# Patient Record
Sex: Female | Born: 1965
Health system: Southern US, Community
[De-identification: ages and names within clinical notes are randomized; demographics above are authoritative.]

## PROBLEM LIST (undated history)

## (undated) DIAGNOSIS — K219 Gastro-esophageal reflux disease without esophagitis: Secondary | ICD-10-CM

## (undated) DIAGNOSIS — J45909 Unspecified asthma, uncomplicated: Secondary | ICD-10-CM

## (undated) DIAGNOSIS — D473 Essential (hemorrhagic) thrombocythemia: Secondary | ICD-10-CM

## (undated) DIAGNOSIS — E119 Type 2 diabetes mellitus without complications: Secondary | ICD-10-CM

## (undated) DIAGNOSIS — I10 Essential (primary) hypertension: Secondary | ICD-10-CM

## (undated) DIAGNOSIS — E785 Hyperlipidemia, unspecified: Secondary | ICD-10-CM

## (undated) HISTORY — PX: COMBINED HYSTERECTOMY ABDOMINAL W/ A&P REPAIR / OOPHORECTOMY: SUR292

## (undated) HISTORY — DX: Type 2 diabetes mellitus without complications: E11.9

## (undated) HISTORY — DX: Essential (hemorrhagic) thrombocythemia: D47.3

## (undated) HISTORY — DX: Unspecified asthma, uncomplicated: J45.909

## (undated) HISTORY — PX: WRIST ARTHROSCOPY: SUR100

## (undated) HISTORY — DX: Hyperlipidemia, unspecified: E78.5

---

## 1997-10-03 ENCOUNTER — Other Ambulatory Visit: Admission: RE | Admit: 1997-10-03 | Discharge: 1997-10-03 | Payer: Self-pay | Admitting: Gynecology

## 1997-10-31 ENCOUNTER — Ambulatory Visit (HOSPITAL_COMMUNITY): Admission: RE | Admit: 1997-10-31 | Discharge: 1997-10-31 | Payer: Self-pay | Admitting: Family Medicine

## 1997-11-21 ENCOUNTER — Ambulatory Visit (HOSPITAL_COMMUNITY): Admission: RE | Admit: 1997-11-21 | Discharge: 1997-11-21 | Payer: Self-pay | Admitting: Family Medicine

## 1998-01-05 ENCOUNTER — Other Ambulatory Visit: Admission: RE | Admit: 1998-01-05 | Discharge: 1998-01-05 | Payer: Self-pay | Admitting: Obstetrics and Gynecology

## 1998-02-17 ENCOUNTER — Encounter: Admission: RE | Admit: 1998-02-17 | Discharge: 1998-05-18 | Payer: Self-pay | Admitting: *Deleted

## 1998-04-17 ENCOUNTER — Ambulatory Visit (HOSPITAL_COMMUNITY): Admission: RE | Admit: 1998-04-17 | Discharge: 1998-04-17 | Payer: Self-pay | Admitting: Obstetrics and Gynecology

## 1998-08-17 ENCOUNTER — Observation Stay (HOSPITAL_COMMUNITY): Admission: RE | Admit: 1998-08-17 | Discharge: 1998-08-18 | Payer: Self-pay | Admitting: Obstetrics and Gynecology

## 1998-11-01 ENCOUNTER — Emergency Department (HOSPITAL_COMMUNITY): Admission: EM | Admit: 1998-11-01 | Discharge: 1998-11-01 | Payer: Self-pay

## 1998-11-23 ENCOUNTER — Encounter: Admission: RE | Admit: 1998-11-23 | Discharge: 1999-02-21 | Payer: Self-pay | Admitting: *Deleted

## 1999-05-10 DIAGNOSIS — E119 Type 2 diabetes mellitus without complications: Secondary | ICD-10-CM | POA: Insufficient documentation

## 1999-09-08 ENCOUNTER — Encounter: Admission: RE | Admit: 1999-09-08 | Discharge: 1999-09-08 | Payer: Self-pay | Admitting: Internal Medicine

## 1999-09-27 ENCOUNTER — Encounter: Admission: RE | Admit: 1999-09-27 | Discharge: 1999-12-26 | Payer: Self-pay | Admitting: *Deleted

## 1999-12-31 ENCOUNTER — Encounter: Admission: RE | Admit: 1999-12-31 | Discharge: 1999-12-31 | Payer: Self-pay | Admitting: Internal Medicine

## 2000-03-24 ENCOUNTER — Encounter: Admission: RE | Admit: 2000-03-24 | Discharge: 2000-03-24 | Payer: Self-pay | Admitting: Internal Medicine

## 2000-06-05 ENCOUNTER — Encounter: Admission: RE | Admit: 2000-06-05 | Discharge: 2000-06-05 | Payer: Self-pay | Admitting: Internal Medicine

## 2000-06-08 ENCOUNTER — Encounter: Payer: Self-pay | Admitting: *Deleted

## 2000-06-08 ENCOUNTER — Ambulatory Visit (HOSPITAL_COMMUNITY): Admission: RE | Admit: 2000-06-08 | Discharge: 2000-06-08 | Payer: Self-pay | Admitting: *Deleted

## 2000-07-02 ENCOUNTER — Emergency Department (HOSPITAL_COMMUNITY): Admission: EM | Admit: 2000-07-02 | Discharge: 2000-07-02 | Payer: Self-pay | Admitting: *Deleted

## 2000-07-07 ENCOUNTER — Encounter: Admission: RE | Admit: 2000-07-07 | Discharge: 2000-07-07 | Payer: Self-pay | Admitting: Internal Medicine

## 2000-07-25 ENCOUNTER — Encounter: Admission: RE | Admit: 2000-07-25 | Discharge: 2000-07-25 | Payer: Self-pay | Admitting: Obstetrics & Gynecology

## 2000-08-08 ENCOUNTER — Encounter: Admission: RE | Admit: 2000-08-08 | Discharge: 2000-08-08 | Payer: Self-pay | Admitting: Hematology and Oncology

## 2000-09-25 ENCOUNTER — Emergency Department (HOSPITAL_COMMUNITY): Admission: EM | Admit: 2000-09-25 | Discharge: 2000-09-25 | Payer: Self-pay | Admitting: Internal Medicine

## 2000-09-25 ENCOUNTER — Encounter: Payer: Self-pay | Admitting: Internal Medicine

## 2000-10-06 ENCOUNTER — Ambulatory Visit (HOSPITAL_COMMUNITY): Admission: RE | Admit: 2000-10-06 | Discharge: 2000-10-06 | Payer: Self-pay | Admitting: Internal Medicine

## 2000-10-06 ENCOUNTER — Encounter: Admission: RE | Admit: 2000-10-06 | Discharge: 2000-10-06 | Payer: Self-pay | Admitting: Internal Medicine

## 2000-10-06 ENCOUNTER — Encounter: Payer: Self-pay | Admitting: Internal Medicine

## 2000-10-11 ENCOUNTER — Encounter: Admission: RE | Admit: 2000-10-11 | Discharge: 2000-10-11 | Payer: Self-pay | Admitting: Internal Medicine

## 2000-10-26 ENCOUNTER — Ambulatory Visit (HOSPITAL_COMMUNITY): Admission: RE | Admit: 2000-10-26 | Discharge: 2000-10-26 | Payer: Self-pay | Admitting: Internal Medicine

## 2000-10-30 ENCOUNTER — Encounter: Admission: RE | Admit: 2000-10-30 | Discharge: 2000-10-30 | Payer: Self-pay | Admitting: Internal Medicine

## 2000-11-02 ENCOUNTER — Ambulatory Visit (HOSPITAL_COMMUNITY): Admission: RE | Admit: 2000-11-02 | Discharge: 2000-11-02 | Payer: Self-pay | Admitting: Internal Medicine

## 2000-11-13 ENCOUNTER — Encounter: Admission: RE | Admit: 2000-11-13 | Discharge: 2000-11-13 | Payer: Self-pay | Admitting: Internal Medicine

## 2000-11-15 ENCOUNTER — Ambulatory Visit (HOSPITAL_COMMUNITY): Admission: RE | Admit: 2000-11-15 | Discharge: 2000-11-15 | Payer: Self-pay | Admitting: Family Medicine

## 2000-11-15 ENCOUNTER — Encounter: Payer: Self-pay | Admitting: Family Medicine

## 2000-12-15 ENCOUNTER — Ambulatory Visit (HOSPITAL_COMMUNITY): Admission: RE | Admit: 2000-12-15 | Discharge: 2000-12-15 | Payer: Self-pay | Admitting: Internal Medicine

## 2000-12-15 ENCOUNTER — Encounter: Payer: Self-pay | Admitting: Internal Medicine

## 2001-02-08 ENCOUNTER — Ambulatory Visit (HOSPITAL_COMMUNITY): Admission: RE | Admit: 2001-02-08 | Discharge: 2001-02-08 | Payer: Self-pay | Admitting: Internal Medicine

## 2001-03-26 ENCOUNTER — Encounter: Payer: Self-pay | Admitting: Emergency Medicine

## 2001-03-26 ENCOUNTER — Emergency Department (HOSPITAL_COMMUNITY): Admission: EM | Admit: 2001-03-26 | Discharge: 2001-03-26 | Payer: Self-pay | Admitting: Emergency Medicine

## 2001-03-29 ENCOUNTER — Encounter: Admission: RE | Admit: 2001-03-29 | Discharge: 2001-03-29 | Payer: Self-pay | Admitting: Internal Medicine

## 2001-04-03 ENCOUNTER — Ambulatory Visit (HOSPITAL_COMMUNITY): Admission: RE | Admit: 2001-04-03 | Discharge: 2001-04-03 | Payer: Self-pay | Admitting: Internal Medicine

## 2001-05-21 ENCOUNTER — Encounter: Admission: RE | Admit: 2001-05-21 | Discharge: 2001-05-21 | Payer: Self-pay | Admitting: Internal Medicine

## 2001-06-05 ENCOUNTER — Ambulatory Visit (HOSPITAL_COMMUNITY): Admission: RE | Admit: 2001-06-05 | Discharge: 2001-06-05 | Payer: Self-pay

## 2001-06-05 ENCOUNTER — Encounter: Payer: Self-pay | Admitting: Internal Medicine

## 2001-06-22 ENCOUNTER — Encounter: Admission: RE | Admit: 2001-06-22 | Discharge: 2001-06-22 | Payer: Self-pay | Admitting: Internal Medicine

## 2001-09-28 ENCOUNTER — Encounter: Admission: RE | Admit: 2001-09-28 | Discharge: 2001-09-28 | Payer: Self-pay | Admitting: Internal Medicine

## 2002-03-29 ENCOUNTER — Encounter: Admission: RE | Admit: 2002-03-29 | Discharge: 2002-03-29 | Payer: Self-pay | Admitting: Internal Medicine

## 2002-04-24 ENCOUNTER — Encounter: Admission: RE | Admit: 2002-04-24 | Discharge: 2002-04-24 | Payer: Self-pay | Admitting: Internal Medicine

## 2002-05-23 ENCOUNTER — Encounter: Admission: RE | Admit: 2002-05-23 | Discharge: 2002-05-23 | Payer: Self-pay | Admitting: Internal Medicine

## 2002-11-25 ENCOUNTER — Encounter: Admission: RE | Admit: 2002-11-25 | Discharge: 2002-11-25 | Payer: Self-pay | Admitting: Internal Medicine

## 2002-12-13 ENCOUNTER — Encounter: Admission: RE | Admit: 2002-12-13 | Discharge: 2002-12-13 | Payer: Self-pay | Admitting: Internal Medicine

## 2002-12-16 ENCOUNTER — Encounter: Payer: Self-pay | Admitting: Internal Medicine

## 2002-12-16 ENCOUNTER — Ambulatory Visit (HOSPITAL_COMMUNITY): Admission: RE | Admit: 2002-12-16 | Discharge: 2002-12-16 | Payer: Self-pay | Admitting: Internal Medicine

## 2003-01-23 ENCOUNTER — Encounter: Admission: RE | Admit: 2003-01-23 | Discharge: 2003-01-23 | Payer: Self-pay | Admitting: Obstetrics and Gynecology

## 2003-02-04 ENCOUNTER — Encounter: Admission: RE | Admit: 2003-02-04 | Discharge: 2003-02-04 | Payer: Self-pay | Admitting: Internal Medicine

## 2003-06-02 ENCOUNTER — Encounter: Admission: RE | Admit: 2003-06-02 | Discharge: 2003-06-04 | Payer: Self-pay | Admitting: Internal Medicine

## 2003-07-11 ENCOUNTER — Encounter: Admission: RE | Admit: 2003-07-11 | Discharge: 2003-07-11 | Payer: Self-pay | Admitting: Internal Medicine

## 2003-08-08 ENCOUNTER — Emergency Department (HOSPITAL_COMMUNITY): Admission: EM | Admit: 2003-08-08 | Discharge: 2003-08-08 | Payer: Self-pay | Admitting: Emergency Medicine

## 2003-08-11 ENCOUNTER — Emergency Department (HOSPITAL_COMMUNITY): Admission: EM | Admit: 2003-08-11 | Discharge: 2003-08-11 | Payer: Self-pay | Admitting: Emergency Medicine

## 2003-09-30 ENCOUNTER — Encounter: Admission: RE | Admit: 2003-09-30 | Discharge: 2003-09-30 | Payer: Self-pay | Admitting: Internal Medicine

## 2003-09-30 ENCOUNTER — Ambulatory Visit (HOSPITAL_COMMUNITY): Admission: RE | Admit: 2003-09-30 | Discharge: 2003-09-30 | Payer: Self-pay | Admitting: Internal Medicine

## 2003-09-30 ENCOUNTER — Inpatient Hospital Stay (HOSPITAL_COMMUNITY): Admission: AD | Admit: 2003-09-30 | Discharge: 2003-10-06 | Payer: Self-pay | Admitting: Internal Medicine

## 2003-11-11 ENCOUNTER — Encounter: Admission: RE | Admit: 2003-11-11 | Discharge: 2003-11-11 | Payer: Self-pay | Admitting: Internal Medicine

## 2003-11-17 ENCOUNTER — Inpatient Hospital Stay (HOSPITAL_COMMUNITY): Admission: AD | Admit: 2003-11-17 | Discharge: 2003-11-21 | Payer: Self-pay | Admitting: Internal Medicine

## 2003-11-17 ENCOUNTER — Encounter: Admission: RE | Admit: 2003-11-17 | Discharge: 2003-11-17 | Payer: Self-pay | Admitting: Internal Medicine

## 2003-11-22 ENCOUNTER — Emergency Department (HOSPITAL_COMMUNITY): Admission: EM | Admit: 2003-11-22 | Discharge: 2003-11-22 | Payer: Self-pay | Admitting: *Deleted

## 2003-11-23 ENCOUNTER — Emergency Department (HOSPITAL_COMMUNITY): Admission: EM | Admit: 2003-11-23 | Discharge: 2003-11-23 | Payer: Self-pay | Admitting: Emergency Medicine

## 2003-11-24 ENCOUNTER — Encounter (HOSPITAL_COMMUNITY): Admission: RE | Admit: 2003-11-24 | Discharge: 2003-12-15 | Payer: Self-pay | Admitting: Internal Medicine

## 2003-11-29 ENCOUNTER — Emergency Department (HOSPITAL_COMMUNITY): Admission: EM | Admit: 2003-11-29 | Discharge: 2003-11-29 | Payer: Self-pay | Admitting: *Deleted

## 2003-11-30 ENCOUNTER — Emergency Department (HOSPITAL_COMMUNITY): Admission: EM | Admit: 2003-11-30 | Discharge: 2003-11-30 | Payer: Self-pay | Admitting: Emergency Medicine

## 2003-12-01 ENCOUNTER — Encounter: Admission: RE | Admit: 2003-12-01 | Discharge: 2003-12-01 | Payer: Self-pay | Admitting: Internal Medicine

## 2004-01-02 ENCOUNTER — Encounter: Admission: RE | Admit: 2004-01-02 | Discharge: 2004-01-02 | Payer: Self-pay | Admitting: Internal Medicine

## 2004-04-06 ENCOUNTER — Ambulatory Visit: Payer: Self-pay | Admitting: Internal Medicine

## 2004-05-05 ENCOUNTER — Ambulatory Visit: Payer: Self-pay | Admitting: Internal Medicine

## 2004-06-13 ENCOUNTER — Ambulatory Visit: Payer: Self-pay | Admitting: *Deleted

## 2004-06-13 ENCOUNTER — Emergency Department (HOSPITAL_COMMUNITY): Admission: EM | Admit: 2004-06-13 | Discharge: 2004-06-13 | Payer: Self-pay | Admitting: Emergency Medicine

## 2004-08-09 ENCOUNTER — Ambulatory Visit: Payer: Self-pay | Admitting: Internal Medicine

## 2004-08-25 ENCOUNTER — Ambulatory Visit: Payer: Self-pay | Admitting: Internal Medicine

## 2005-05-24 ENCOUNTER — Encounter: Admission: RE | Admit: 2005-05-24 | Discharge: 2005-05-24 | Payer: Self-pay | Admitting: Family Medicine

## 2006-07-11 ENCOUNTER — Encounter: Admission: RE | Admit: 2006-07-11 | Discharge: 2006-07-11 | Payer: Self-pay | Admitting: Family Medicine

## 2007-03-28 ENCOUNTER — Ambulatory Visit: Payer: Self-pay | Admitting: Internal Medicine

## 2007-08-22 ENCOUNTER — Encounter: Admission: RE | Admit: 2007-08-22 | Discharge: 2007-08-22 | Payer: Self-pay | Admitting: Family Medicine

## 2007-09-15 ENCOUNTER — Emergency Department (HOSPITAL_COMMUNITY): Admission: EM | Admit: 2007-09-15 | Discharge: 2007-09-15 | Payer: Self-pay | Admitting: Emergency Medicine

## 2008-01-11 ENCOUNTER — Ambulatory Visit: Payer: Self-pay | Admitting: Internal Medicine

## 2008-01-11 DIAGNOSIS — J45901 Unspecified asthma with (acute) exacerbation: Secondary | ICD-10-CM | POA: Insufficient documentation

## 2008-01-18 ENCOUNTER — Telehealth (INDEPENDENT_AMBULATORY_CARE_PROVIDER_SITE_OTHER): Payer: Self-pay | Admitting: *Deleted

## 2008-03-05 ENCOUNTER — Ambulatory Visit: Payer: Self-pay | Admitting: Internal Medicine

## 2008-03-05 DIAGNOSIS — R29818 Other symptoms and signs involving the nervous system: Secondary | ICD-10-CM | POA: Insufficient documentation

## 2008-03-06 ENCOUNTER — Telehealth: Payer: Self-pay | Admitting: Internal Medicine

## 2008-03-14 ENCOUNTER — Encounter: Payer: Self-pay | Admitting: Internal Medicine

## 2008-03-17 ENCOUNTER — Telehealth (INDEPENDENT_AMBULATORY_CARE_PROVIDER_SITE_OTHER): Payer: Self-pay | Admitting: *Deleted

## 2008-04-27 ENCOUNTER — Emergency Department (HOSPITAL_COMMUNITY): Admission: EM | Admit: 2008-04-27 | Discharge: 2008-04-27 | Payer: Self-pay | Admitting: Family Medicine

## 2008-07-09 ENCOUNTER — Telehealth (INDEPENDENT_AMBULATORY_CARE_PROVIDER_SITE_OTHER): Payer: Self-pay | Admitting: *Deleted

## 2008-07-23 ENCOUNTER — Telehealth: Payer: Self-pay | Admitting: Internal Medicine

## 2008-09-02 ENCOUNTER — Encounter: Admission: RE | Admit: 2008-09-02 | Discharge: 2008-09-02 | Payer: Self-pay | Admitting: Family Medicine

## 2008-09-02 ENCOUNTER — Telehealth (INDEPENDENT_AMBULATORY_CARE_PROVIDER_SITE_OTHER): Payer: Self-pay | Admitting: *Deleted

## 2008-09-10 ENCOUNTER — Ambulatory Visit: Payer: Self-pay | Admitting: Internal Medicine

## 2008-10-21 ENCOUNTER — Emergency Department (HOSPITAL_COMMUNITY): Admission: EM | Admit: 2008-10-21 | Discharge: 2008-10-21 | Payer: Self-pay | Admitting: Emergency Medicine

## 2008-11-12 ENCOUNTER — Encounter: Admission: RE | Admit: 2008-11-12 | Discharge: 2009-02-05 | Payer: Self-pay | Admitting: Orthopaedic Surgery

## 2008-12-09 ENCOUNTER — Telehealth (INDEPENDENT_AMBULATORY_CARE_PROVIDER_SITE_OTHER): Payer: Self-pay | Admitting: *Deleted

## 2008-12-24 ENCOUNTER — Ambulatory Visit (HOSPITAL_COMMUNITY): Admission: RE | Admit: 2008-12-24 | Discharge: 2008-12-24 | Payer: Self-pay | Admitting: Orthopaedic Surgery

## 2009-01-08 ENCOUNTER — Encounter: Admission: RE | Admit: 2009-01-08 | Discharge: 2009-01-08 | Payer: Self-pay | Admitting: Family Medicine

## 2009-02-10 ENCOUNTER — Telehealth (INDEPENDENT_AMBULATORY_CARE_PROVIDER_SITE_OTHER): Payer: Self-pay | Admitting: *Deleted

## 2009-02-17 ENCOUNTER — Ambulatory Visit: Payer: Self-pay | Admitting: Internal Medicine

## 2009-02-17 DIAGNOSIS — J4520 Mild intermittent asthma, uncomplicated: Secondary | ICD-10-CM

## 2009-02-17 DIAGNOSIS — K219 Gastro-esophageal reflux disease without esophagitis: Secondary | ICD-10-CM | POA: Insufficient documentation

## 2009-02-17 DIAGNOSIS — J4540 Moderate persistent asthma, uncomplicated: Secondary | ICD-10-CM | POA: Insufficient documentation

## 2009-03-31 ENCOUNTER — Telehealth (INDEPENDENT_AMBULATORY_CARE_PROVIDER_SITE_OTHER): Payer: Self-pay | Admitting: *Deleted

## 2009-05-25 ENCOUNTER — Telehealth: Payer: Self-pay | Admitting: Internal Medicine

## 2009-05-29 ENCOUNTER — Telehealth: Payer: Self-pay | Admitting: Internal Medicine

## 2009-07-09 ENCOUNTER — Telehealth (INDEPENDENT_AMBULATORY_CARE_PROVIDER_SITE_OTHER): Payer: Self-pay | Admitting: *Deleted

## 2009-07-23 ENCOUNTER — Telehealth (INDEPENDENT_AMBULATORY_CARE_PROVIDER_SITE_OTHER): Payer: Self-pay | Admitting: *Deleted

## 2009-08-13 ENCOUNTER — Telehealth (INDEPENDENT_AMBULATORY_CARE_PROVIDER_SITE_OTHER): Payer: Self-pay | Admitting: *Deleted

## 2009-09-21 ENCOUNTER — Telehealth: Payer: Self-pay | Admitting: Internal Medicine

## 2009-11-23 ENCOUNTER — Telehealth: Payer: Self-pay | Admitting: Internal Medicine

## 2009-12-01 ENCOUNTER — Ambulatory Visit: Payer: Self-pay | Admitting: Internal Medicine

## 2010-01-12 ENCOUNTER — Telehealth (INDEPENDENT_AMBULATORY_CARE_PROVIDER_SITE_OTHER): Payer: Self-pay | Admitting: *Deleted

## 2010-02-02 ENCOUNTER — Ambulatory Visit: Payer: Self-pay | Admitting: Internal Medicine

## 2010-06-08 NOTE — Progress Notes (Signed)
Summary: cough   Phone Note Call from Patient Call back at [phone]   Caller: Patient Call For: Ebin Palazzi Summary of Call: pt would like cough syrup called to pharmacy [facility] Initial call taken by: Rickard Patience,  May 29, 2009 8:43 AM  Follow-up for Phone Call        pt c/o congestion and productive cough with yellow phlegm x 3 days. pt also c/o being hoarse x 3 days as well.  Pt request cough medicine. Please advise. Carron Curie CMA  May 29, 2009 9:00 AM  allergies: PCN, Voltaren   Additional Follow-up for Phone Call Additional follow up Details #1::        I added codeine cough syrup to med list. Ok to send. Additional Follow-up by: Waymon Budge MD,  May 29, 2009 9:08 AM    Additional Follow-up for Phone Call Additional follow up Details #2::    rx called in. pt aware. Carron Curie CMA  May 29, 2009 9:25 AM   New/Updated Medications: PROMETHAZINE-CODEINE 6.25-10 MG/5ML SYRP (PROMETHAZINE-CODEINE) 1 teaspoon four times a day as needed cough Prescriptions: PROMETHAZINE-CODEINE 6.25-10 MG/5ML SYRP (PROMETHAZINE-CODEINE) 1 teaspoon four times a day as needed cough  #200 ml x 0   Entered and Authorized by:   Waymon Budge MD   Signed by:   Waymon Budge MD on 05/29/2009   Method used:   Historical   RxID:   [id number]

## 2010-06-08 NOTE — Progress Notes (Signed)
Summary: samples  Phone Note Call from Patient Call back at [phone]   Caller: Patient Call For: young Summary of Call: pt need samples of advair 250/50 would like month supply Initial call taken by: Rickard Patience,  August 13, 2009 8:40 AM  Follow-up for Phone Call        LM that sample was left at front desk for pick up. Abigail Miyamoto RN  August 13, 2009 9:05 AM

## 2010-06-08 NOTE — Progress Notes (Signed)
Summary: samples  Phone Note Call from Patient Call back at [phone]   Caller: Patient-Brooke Floyd Call For: young Reason for Call: Talk to Nurse Summary of Call: Samples of Advair 250/50 Initial call taken by: Brooke Floyd,  May 25, 2009 9:26 AM  Follow-up for Phone Call        sample left at front. pt aware.  Brooke Floyd CMA  May 25, 2009 9:41 AM

## 2010-06-08 NOTE — Assessment & Plan Note (Signed)
Summary: f/u ///kp   Allergies: 1)  ! Pcn 2)  ! Voltaren   Other Orders: No Charge Patient Arrived (NCPA0) (NCPA0)   Pt rescheduled to 12-01-09. Brooke Floyd

## 2010-06-08 NOTE — Progress Notes (Signed)
Summary: samples  Phone Note Call from Patient Call back at Home Phone [phone]   Caller: Patient Call For: young Reason for Call: Talk to Nurse Summary of Call: Patient asking if we have samples of lantus? Initial call taken by: Lehman Prom,  January 12, 2010 12:17 PM  Follow-up for Phone Call        We do not have any samples to give patient; she should call her PCP or diabetic dr-she states she is trying to get assistance through Lantus program. Also, pt stated that she needs help with her Advair-mailed assistance program info to patient.Reynaldo Minium CMA  January 12, 2010 12:30 PM    Will return to our office once completed.Reynaldo Minium CMA  January 12, 2010 12:30 PM   pt aware no samples available Philipp Deputy [facility]  January 12, 2010 12:27 PM  Follow-up by: Philipp Deputy CMA,  January 12, 2010 12:27 PM

## 2010-06-08 NOTE — Progress Notes (Signed)
Summary: samples  Phone Note Call from Patient Call back at [phone]   Caller: Patient Call For: young Reason for Call: Talk to Nurse Summary of Call: Advair 250/50 samples Initial call taken by: Eugene Gavia,  July 09, 2009 10:26 AM  Follow-up for Phone Call        1 box of advair left up front for pt to pick up.  Pt notified. Follow-up by: Vernie Murders,  July 09, 2009 11:19 AM

## 2010-06-08 NOTE — Progress Notes (Signed)
Summary: nos appt  Phone Note Call from Patient   Caller: [email or web address]  Call For: Neisha Hinger Summary of Call: ATC pt to rsc nos from 5/13, home phone disconnected, incorrect cell number. Initial call taken by: Darletta Moll,  Sep 21, 2009 3:14 PM

## 2010-06-08 NOTE — Progress Notes (Signed)
Summary: returned call  Phone Note Call from Patient Call back at Home Phone [phone]   Caller: Patient Call For: young Summary of Call: pt returning call (says someone called her today).  Initial call taken by: Tivis Ringer, CNA,  November 23, 2009 12:23 PM  Follow-up for Phone Call        unsure who called her.  I don't see anywhere in her chart where we tried to call her.  Will forward message to Florentina Addison to see if CY or Florentina Addison tried calling pt.  Aundra Millet Reynolds LPN  November 23, 2009 1:22 PM   Additional Follow-up for Phone Call Additional follow up Details #1::        Unsure why someone would have called her; she does have an appt coming up on 11-30-09 at 345pm with [facility].Reynaldo Minium CMA  November 23, 2009 1:51 PM     Additional Follow-up for Phone Call Additional follow up Details #2::    ATC pt at home #.  Line has been disconnected.  ATC pt at work #.  Was informed I had the wrong #.  Looked through pt's chart and found a previous phone note with a different #.Marland KitchenMarland KitchenMarland [phone].  Called this # and was informed that # is no longer accepting phone calls.  Therefore, I have no way to reach pt.  Will forward message back to Nicholos Johns so when she calls back we can get a correct # to reach pt at.  Aundra Millet Reynolds LPN  November 23, 2009 2:16 PM
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
## 2010-06-08 NOTE — Progress Notes (Signed)
Summary: Records request from [facility]  Request for records received from [facility]. Request forwarded to [facility]. Dena Chavis  July 23, 2009 3:30 PM
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
## 2010-07-01 ENCOUNTER — Telehealth: Payer: Self-pay | Admitting: Internal Medicine

## 2010-07-06 NOTE — Progress Notes (Signed)
Summary: prescription-zpak and hydromet sent to pharmacy  Phone Note Call from Patient Call back at Home Phone [phone]   Caller: Patient Call For: Dominik Yordy Summary of Call: Wants rx for hydrocodone.//[facility] Initial call taken by: Darletta Moll,  July 01, 2010 12:29 PM  Follow-up for Phone Call        called and spoke with pt.  pt states she had an "asthma attack" on Tuesday 06/29/2010.  Pt states she is feeling better and is breathing better- has only "slight wheezing and tightness in chest" but states she still has a "bad cough."  Will cough up yellow sputum.  Also c/o sore throat, hurts to talk ,and hoarseness.  Pt denies fever or nasal congestion.  Pt states she is having to use rescue hfa once a day.  pt requesting rx for cough syrup.  Please adivse. Marland KitchenArman Filter LPN  July 01, 2010 2:34 PM  allergies: pcn, voltaren  Additional Follow-up for Phone Call Additional follow up Details #1::        Per [facility]-hydromet #265ml 1 tsp every 6 hours as needed cough and Zpak #1 take as directed no refills on either.Reynaldo Minium CMA  July 01, 2010 4:17 PM   Pt informed of above. Rx's sent and called to [facility]. Zackery Barefoot CMA  July 01, 2010 4:45 PM     New/Updated Medications: ZITHROMAX Z-PAK 250 MG TABS (AZITHROMYCIN) as directed HYDROMET 5-1.5 MG/5ML SYRP (HYDROCODONE-HOMATROPINE) 1 tsp by mouth every 6 hours as needed for cough Prescriptions: HYDROMET 5-1.5 MG/5ML SYRP (HYDROCODONE-HOMATROPINE) 1 tsp by mouth every 6 hours as needed for cough  #279mL x 0   Entered by:   Zackery Barefoot CMA   Authorized by:   Waymon Budge MD   Signed by:   Zackery Barefoot CMA on 07/01/2010   Method used:   Telephoned to ...       [facility] [id number] (retail)       [address]       [place], [place]  [address]       Ph: [phone]       Fax: [phone]   RxID:   [id number] ZITHROMAX Z-PAK 250 MG TABS (AZITHROMYCIN) as directed  #1 x  0   Entered by:   Zackery Barefoot CMA   Authorized by:   Waymon Budge MD   Signed by:   Zackery Barefoot CMA on 07/01/2010   Method used:   Electronically to        [facility] [id number] (retail)       [address]       [place], [place]  [address]       Ph: [phone]       Fax: [phone]   RxID:   [id number]

## 2010-07-07 ENCOUNTER — Telehealth (INDEPENDENT_AMBULATORY_CARE_PROVIDER_SITE_OTHER): Payer: Self-pay | Admitting: *Deleted

## 2010-07-15 NOTE — Progress Notes (Signed)
Summary: would like samples of dulera  Phone Note Call from Patient Call back at Home Phone [phone]   Caller: Patient Call For: young Summary of Call: patient phoned and wanted to know if we have any samples of Dulera. She can be reached at [phone] Initial call taken by: Vedia Coffer,  July 07, 2010 4:47 PM  Follow-up for Phone Call        1 sample dulera 100/75mcg left at front for pick up.  Pt aware.  Lot # [id number] Exp Date June 2012   Follow-up by: Gweneth Dimitri RN,  July 07, 2010 4:59 PM

## 2010-07-27 ENCOUNTER — Encounter: Payer: Self-pay | Admitting: Internal Medicine

## 2010-08-03 ENCOUNTER — Ambulatory Visit: Payer: Self-pay | Admitting: Internal Medicine
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
## 2010-09-20 ENCOUNTER — Encounter: Payer: Self-pay | Admitting: Internal Medicine

## 2010-09-20 ENCOUNTER — Ambulatory Visit (INDEPENDENT_AMBULATORY_CARE_PROVIDER_SITE_OTHER): Payer: Self-pay | Admitting: Internal Medicine

## 2010-09-20 VITALS — BP 130/78 | HR 110 | Ht 62.0 in | Wt 143.2 lb

## 2010-09-20 DIAGNOSIS — J45901 Unspecified asthma with (acute) exacerbation: Secondary | ICD-10-CM

## 2010-09-20 MED ORDER — ALBUTEROL SULFATE HFA 108 (90 BASE) MCG/ACT IN AERS: 2.0000 | INHALATION_SPRAY | Freq: Four times a day (QID) | RESPIRATORY_TRACT | Status: DC | PRN

## 2010-09-20 MED ORDER — HYDROCODONE-HOMATROPINE 5-1.5 MG/5ML PO SYRP: 5.0000 mL | ORAL_SOLUTION | Freq: Four times a day (QID) | ORAL | Status: AC | PRN

## 2010-09-20 MED ORDER — AZITHROMYCIN 250 MG PO TABS: ORAL_TABLET | ORAL | Status: AC

## 2010-09-20 MED ORDER — MOMETASONE FURO-FORMOTEROL FUM 100-5 MCG/ACT IN AERO: 2.0000 ug | INHALATION_SPRAY | Freq: Two times a day (BID) | RESPIRATORY_TRACT | Status: DC

## 2010-09-20 NOTE — Patient Instructions (Signed)
Scripts written.   Hope you feel better quickly.   There are treatments- ask your primary doctor or gyn about treatment for "stress incontinence"

## 2010-09-20 NOTE — Assessment & Plan Note (Signed)
Viral pattern URI with bronchitis. We will encourage fluids, add cough syrup, Z pak, and refill her inhalers.

## 2010-09-20 NOTE — Progress Notes (Signed)
Subjective:     Patient ID: Brooke Floyd, female   DOB: 11-06-1965, 45 y.o.   MRN: [id number]  HPI 35 yoF never smoker, followed for asthma complicated by GERD. Husband here. Last here February 02, 2010  Acute illness- Began about 9-10 days ago with productive cough, aching, chills, sore throat. Treated herself with otc decongestant and throat spray. She had done well with Dulera 100 until this. Husband had a similar illness shortly before. She is frustrated by tussive stress incontinence.  Review of Systems Constitutional:   No weight loss, night sweats,  Fevers, fatigue, lassitude. HEENT:   No headaches,  Difficulty swallowing,  Tooth/dental problems,  Sore throat,                 CV:  No chest pain,  Orthopnea, PND, swelling in lower extremities, anasarca, dizziness, palpitations  GI  No heartburn, indigestion, abdominal pain, nausea, vomiting, diarrhea, change in bowel habits, loss of appetite  Resp: No shortness of breath with exertion or at rest.  No excess mucus,,  No coughing up of blood.  No change in color of mucus.  No wheezing.  Skin: no rash or lesions.  GU: no dysuria, change in color of urine, no urgency or frequency.  No flank pain.  MS:  No joint pain or swelling.  No decreased range of motion.  No back pain.  Psych:  No change in mood or affect. No depression or anxiety.  No memory loss.      Objective:   Physical Exam General- Alert, Oriented, Affect-appropriate, Distress- none acute but coughing and seems tired  Skin- rash-none, lesions- none, excoriation- none  Lymphadenopathy- none  Head- atraumatic  Eyes- Gross vision intact, PERRLA, conjunctivae clear, secretions  Ears- Hearing, canals, Tm - normal  Nose- Clear,Normal- Septal dev, mucus, polyps, erosion, perforation   Throat- Mallampati II , mucosa clear , drainage- none, tonsils- atrophic;     throat a little red  Neck- flexible , trachea midline, no stridor , thyroid nl, carotid no  bruit  Chest - symmetrical excursion , unlabored     Heart/CV- RRR , no murmur , no gallop  , no rub, nl s1 s2                     - JVD- none , edema- none, stasis changes- none, varices- none     Lung- clear to P&A, wheeze- none, cough- intermittent bronchitic ,            dullness-none, rub- none     Chest wall-   Abd- tender-no, distended-no, bowel sounds-present, HSM- no  Br/ Gen/ Rectal- Not done, not indicated  Extrem- cyanosis- none, clubbing, none, atrophy- none, strength- nl  Neuro- grossly intact to observation        Assessment:         Plan:

## 2010-09-21 NOTE — Assessment & Plan Note (Signed)
[facility]                             PULMONARY OFFICE NOTE   NAME:OWENSRefugio, Mcconico                      MRN:          [id number]  DATE:03/28/2007                            DOB:          1965/10/03    PROBLEM:  1. Asthmatic bronchitis.  2. Diabetes.  3. Hypertension.   HISTORY:  First visit in 2 years.  She comes now reporting that she had  been pretty sick two weeks ago but is almost well now.  Describes onset  of her acute illness with a chest cold progressing as a bronchitis.  She  is still sucking some cough drops.  She had bad sore throat and yellow  sputum initially and notices that both parotid areas remain somewhat  tender and puffy.  She has not noticed adenopathy and does not feel she  is running a fever or sweats.  She has had some frontal and bitemporal  headache, no blurred vision, no nausea or vomiting.  She had had  pneumococcal vaccine at some time in the past.  She declines flu  vaccine.   MEDICATION:  1. Aspirin 81 mg.  2. Iron.  3. Advair 250/50.  4. Zyrtec 10 mg.  5. Actos 30 mg.  6. Glipizide 10 mg.  7. Zocor 80 mg.  8. Accupril 10 mg.  9. Flexeril.  10.PRN use of albuterol rescue inhaler.   DRUG INTOLERANT:  1. PENICILLIN.  2. VOLTAREN.   OBJECTIVE:  Weight 148 pounds, BP 124/68, pulse 103, room air saturation  98%.  Dry cough, mild thrush on her hard palate.  Her parotid glands are  maybe just a little prominent bilaterally.  I would not recognize it not  knowing her face.  LUNG FIELDS:  Clear.  HEART:  Sounds regular without murmur.  No edema.   IMPRESSION:  1. Allergic rhinitis, probably a little nonspecific viral parotitis,      although she understands to report if that fails to resolve.  2. Underlying asthmatic bronchitis.   PLAN:  1. Z-Pak, to hold with discussion.  2. Mouth care for thrush.  3. We gave her nasal inhalation Neo-Synephrine and we gave Depo-Medrol      40 mg IM with steroid talk  related to her blood sugar.  4. She can use over-the-counter sinus medicines for comfort.  5. We are scheduling return in 1 year, earlier p.r.n.   Note that she declines flu vaccine.     Clinton D. Maple Hudson, MD, Tonny Bollman, FACP  Electronically Signed    CDY/MedQ  DD: 03/28/2007  DT: 03/29/2007  Job #: [id number]   cc:   Lavonda Jumbo, M.D.

## 2010-09-24 NOTE — Group Therapy Note (Signed)
   NAME:  Brooke Floyd, Brooke Floyd                         ACCOUNT NO.:  [id number]   MEDICAL RECORD NO.:  [id number]                   PATIENT TYPE:  OUT   LOCATION:  [facility]                           FACILITY:  [facility]   PHYSICIAN:  Argentina Donovan, MD                     DATE OF BIRTH:  1966/01/15   DATE OF SERVICE:  01/23/2003                                    CLINIC NOTE   SUMMARY:  The patient is a 45 year old black female who is a gravida 2 para  2-0-0-2 and underwent a total abdominal hysterectomy and right salpingo-  oophorectomy some years ago.  Since that time she states she has had  inguinal pain bilateral that goes from mild to very severe.  On examination  we can duplicate the pain by inguinal pressure.  No demonstrable hernia was  noted.  The patient was told she had a small cyst on her left ovary.  On  bimanual pelvic examination I was able to feel the ovary quite well and  squeeze the ovary to elicit pain to show the patient that this was different  than the pain she was complaining of.  I feel this is probably  neuromuscular, I cannot demonstrate a hernia, and feel that she probably  needs rehabilitative physical therapy of some type and would suggest that  she Brooke Floyd someone in physical medicine.   IMPRESSION:  Bilateral inguinal pain, unknown etiology.                                               Argentina Donovan, MD    PR/MEDQ  D:  01/23/2003  T:  01/24/2003  Job:  [id number]

## 2010-09-24 NOTE — Discharge Summary (Signed)
NAME:  TORIA, MONTE                         ACCOUNT NO.:  [id number]   MEDICAL RECORD NO.:  [id number]                   PATIENT TYPE:  INP   LOCATION:  5530                                 FACILITY:  [facility]   PHYSICIAN:  Blanch Media, M.D.             DATE OF BIRTH:  08/13/1965   DATE OF ADMISSION:  09/30/2003  DATE OF DISCHARGE:  10/06/2003                                 DISCHARGE SUMMARY   DISCHARGE DIAGNOSES:  1. Left buttock lesion (methicillin-resistant Staphylococcus aureus     positive).  2. Anemia.  3. Asthma.  4. Oral thrush.  5. Diabetes mellitus.   DISCHARGE MEDICATIONS:  1. Aspirin 81 mg p.o. daily.  2. Lipitor 40 mg p.o. daily.  3. Multivitamins 1 tablet p.o. daily.  4. Nystatin swish and swallow 5 ml four times a day.  5. Advair 1 puff q.12h.  6. Pepcid 40 mg 1 tablet q.h.s.  7. Glucotrol 20 mg 2 tablets q.12h.  8. Avandia 4 mg p.o. daily.  9. Lantus 20 units subcutaneously q.h.s.  10.      Albuterol MDI 2 puffs q.4-6h. p.r.n. for asthma.  11.      Clinimycin 300 mg 1 tablet p.o. t.i.d. for 1 month.  12.      Vicodin 5/500 one to two tablets p.o. q.4-6h. as needed for pain.     (Do not exceed 8 pills per day or 4 g acetaminophen per day.)   FOLLOW UP:  The patient requested that she be allowed to make her own  followup appointments.  She was told that she needed to see Dr. Ezzard Standing  within one week of discharge and was given the appropriate information for  contact.  The patient was also told that she needs followup with Dr. Liliane Channel  at [facility] within a week of discharge, and she was  given information to call for an appointment.   PROCEDURES:  1. CT of the pelvis with contrast.  Impression: There is what appears to be     a phlegmon in the subcutaneous tissue of the left medial buttock,     probably related to the rectum.  At this time, there is no drainable     abscess.  Probable laterally positioned left ovary with cyst, but  cannot     rule out left iliac adenopathy.  2. Incision and drainage of perirectal abscess on Oct 02, 2003.   CONSULTATIONS:  Surgery consult on Oct 02, 2003.   HISTORY AND PHYSICAL:  [name] is a 45 year old African-American female  with past medical history of diabetes mellitus, asthma, and two days ago  developed bump on left butt cheek.  It started to get bigger and started  to hurt worse.  Lesions with drainage and ring of erythema around lesion.  Copious drainage.  Otherwise denies chest pain, shortness of breath,  diaphoresis, nausea, vomiting, palpitations.  Denies fever, has had some  chills.  She has history of boil on right buttock that was lanced (not  cultured) three weeks ago, treated with Keflex and resolved.   ALLERGIES:  The patient states she has an allergy to PENICILLIN that caused  convulsions when she was an infant per her mother's report and that VOLTAREN  causes itching.   PHYSICAL EXAMINATION:  VITAL SIGNS:  Pulse 103, blood pressure 134/92,  temperature 98.4, respiratory rate 20.  HEART: Regular rate and rhythm.  LUNGS:  Clear to auscultation.  ABDOMEN:  Positive bowel sounds.  Soft, nontender, nondistended.  No  organomegaly.  EXTREMITIES:  No ankle edema, 2+ dorsalis pedis pulses.  SKIN:  No obvious rash, 2 x 2 cm draining lesion on left buttock.  Otherwise, physical exam unremarkable.   LABORATORY AND X-RAY DATA:  Sodium 138, potassium 3.6, chloride 104, bicarb  27, BUN 10, creatinine 0.6, glucose 211.  CBC:  White count 9.2, hemoglobin  9.4, platelets 529 with an ANC of 7.4, MCV 91.0.   The patient was admitted for left buttock lesion.   HOSPITAL COURSE:  #1.  LEFT BUTTOCK LESION:  This was thought likely secondary to staph and  strep.  CT revealed abscess.  She was initially placed on broad-spectrum  antibiotics including vancomycin, Avelox, and Flagyl.  (The patient has a  PENICILLIN allergy.)  She was taken to the OR for incision and  drainage.  Culture of the wound revealed MRSA.  The patient was continued on  vancomycin, but Avelox and Flagyl were discontinued at that time.  She was  given morphine for pain and aggressive glycemic control.  Lesion got much  better during the hospital course, and the patient was felt to be stable for  discharge to home.  The patient requested that she make her own followup  appointments and was given contact information to do so.   #2.  ORAL THRUSH:  The patient was noted to have oral thrush on physical  exam and was given Nystatin swish and swallow.  She was HIV negative, and it  was felt that her oral thrush was secondary to the combination of diabetes  mellitus and the use of steroid inhaler.  She was discharged with Nystatin  swish and swallow and told to use it until her thrush was gone or finishing  the bottle of Nystatin swish and swallow, whichever came first.   #3.  DIABETES MELLITUS:  The patient continued on her home oral  antihyperglycemics in addition to sliding scale insulin.  Long-acting  insulin (Lantus) was started to get tighter control.  She maintained good  CBGs on 20 units of Lantus and was discharged with this medicine in addition  to her home antihyperglycemic regimen.   #4.  ASTHMA:  The patient's asthma was stable on Advair and Albuterol  throughout her hospitalization.   #5.  ANEMIA:  The patient had anemia with ferritin of 260, B12 466.  Iron  panel was within normal limits, and she had no evidence of autoimmune  destruction.  The patient should have further workup including colonoscopy  as an outpatient, especially given the fact that she is status post  hysterectomy and has stopped menstruating.   #6.  INCREASED LIVER FUNCTION TESTS:  This was thought to be likely  secondary to heavy ibuprofen use the past few days at 1500 mg several times  per day.  An acute hepatitis panel was negative.  Her liver functions continued to resolve throughout her  hospitalization.  CMP should be  performed at her next office visit to follow up on her liver functions.   #7.  ABNORMAL CT OF THE PELVIS:  The patient's CT revealed incidental  finding of left ovary with cysts and left iliac adenopathy.  This should be  followed up with CT in the future by her primary care physician.   DISCHARGE LABORATORY DATA:  CBC:  WBC 6.4, hemoglobin 8.9, hematocrit 26.3,  platelets 801.  Sodium 135, potassium 3.8, chloride 100, CO2 28, BUN 3,  creatinine 0.6, glucose 251.      Tawny Asal, MD                   Blanch Media, M.D.    CW/MEDQ  D:  10/08/2003  T:  10/09/2003  Job:  [id number]

## 2010-09-24 NOTE — Consult Note (Signed)
NAME:  Brooke Floyd, Brooke Floyd                         ACCOUNT NO.:  [id number]   MEDICAL RECORD NO.:  [id number]                   PATIENT TYPE:  INP   LOCATION:  5530                                 FACILITY:  [facility]   PHYSICIAN:  Sandria Bales. Ezzard Standing, M.D.               DATE OF BIRTH:  Oct 19, 1965   DATE OF CONSULTATION:  10/02/2003  DATE OF DISCHARGE:                                   CONSULTATION   REASON FOR CONSULTATION:  Left buttock abscess.   HISTORY OF PRESENT ILLNESS:  This is a 45 year old black female who was  admitted by the medicine teaching service on Sep 30, 2003, with increasing  pain in her left buttock.  She had a boil before that had drained and things  had gotten better, but this is the first one that has required  hospitalization.  She is diabetic but not on insulin.  She denies any  history of peptic ulcer disease or colon disease.  She has had a prior  hysterectomy in August 2002, for benign reasons.  She has had left wrist  surgery.   REVIEW OF SYSTEMS:  See the past medical history.   ALLERGIES:  No known drug allergies.   ADMISSION MEDICINES:  1. Prinivil.  2. Lipitor.  3. Glucotrol.  4. Avandia.  5. She is on a baby aspirin.  6. Albuterol.   REVIEW OF SYSTEMS:  CARDIAC:  She has no history of heart disease or chest  pain.  There is questionable hypertension.  PULMONARY:  She has a history of  asthma.  GASTROINTESTINAL:  She has some trouble with constipation, but no  other significant colon problems.  UROLOGIC:  No kidney stones or kidney  infections.  Again, she had a hysterectomy for benign causes, about three  years ago.  ENDOCRINE:  She is diabetic.  She has hypercholesterolemia.   PHYSICAL EXAMINATION:  VITAL SIGNS:  Temperature 99.1 degrees.  The nurses  told me she is up to 101 degrees.  Pulse 114, blood pressure 121/78.  GENERAL:  She is a well-nourished, pleasant black female, alert and  cooperative on physical examination.  LUNGS:  Clear to  auscultation.  HEART:  A regular rate and rhythm.  I hear no murmur or rub.  ABDOMEN:  Soft.  BUTTOCKS:  On her left buttock she has an about 6.0 or 7.0 cm inflamed  indurated area, consistent with a primary left buttock abscess.  I did not  do a rectal examination.  EXTREMITIES:  She has good strength in all four extremities.  NEUROLOGIC:  Is grossly intact.   LABORATORY DATA:  Show a hemoglobin of 9.5, hematocrit 27.7, white blood  count of 5400.  She has a sodium of 136, potassium 3.8, chloride 103, CO2 of  28, glucose 234, BUN 12.  Her SGPT is 50, albumin 3.1.  She did have a CT scan which I reviewed, which showed inflammatory changes  of the left buttock.  There was no clear cyst.   IMPRESSION:  1. She has a left buttock abscess which started to drain.  2. Diabetes mellitus, probably some control problems with her current     infection.  3. Asthma, on albuterol.  4. Hypercholesterolemia.  5. Possible borderline hypertension.   RECOMMENDATIONS:  This really needs a wider debridement and drainage, and I  discussed this with her.  I will take her to the operating room today.                                               Sandria Bales. Ezzard Standing, M.D.    DHN/MEDQ  D:  10/02/2003  T:  10/02/2003  Job:  [id number]   cc:   Blanch Media, M.D.  [facility]ident - [facility].  [place], [place] [address]  Fax: [phone]   Adonis Housekeeper, M.D.  Internal Medicine Resident-[facility]  [place] [address]  Fax: [phone]

## 2010-09-24 NOTE — Op Note (Signed)
NAME:  Brooke Floyd, Brooke Floyd                         ACCOUNT NO.:  [id number]   MEDICAL RECORD NO.:  [id number]                   PATIENT TYPE:  INP   LOCATION:  5530                                 FACILITY:  [facility]   PHYSICIAN:  Sandria Bales. Ezzard Standing, M.D.               DATE OF BIRTH:  1966-04-24   DATE OF PROCEDURE:  10/02/2003  DATE OF DISCHARGE:                                 OPERATIVE REPORT   PREOPERATIVE DIAGNOSIS:  Left perianal/buttocks abscess.   POSTOPERATIVE DIAGNOSIS:  Left perianal/buttocks abscess, approximately 4 x  5 cm cavity.   PROCEDURE:  Incision and drainage of left perianal/buttocks abscess.   SURGEON:  Sandria Bales. Ezzard Standing, M.D.   ANESTHESIA:  General endotracheal.   ESTIMATED BLOOD LOSS:  50 mL.   INDICATION FOR PROCEDURE:  Brooke Floyd is a 45 year old black female admitted  Sep 30, 2003, by the medicine teaching service with a left perianal buttocks  infection.  Now comes for incision and drainage of this abscess.   The patient was given a general endotracheal anesthetic supervised by Bedelia Person, M.D.  She was placed in lithotomy, a roll placed under her buttocks.  On the left posterior buttocks/perianal area she had an indurated area.   I made a linear incision into this abscess about 4 cm and broke into the  abscess.  I irrigated the abscess out with 500 mL of saline, packed it with  two-inch iodoform gauze, and sterilely dressed it.   The patient tolerated the procedure well, will remain in the hospital  overnight.  Will remove the packing tonight and then depending on how she  does will be dependent on when she gets discharged.                                               Sandria Bales. Ezzard Standing, M.D.    DHN/MEDQ  D:  10/02/2003  T:  10/03/2003  Job:  [id number]   cc:   Blanch Media, M.D.  [facility]ident - Internal [facility]  [place], [place] [address]  Fax: [phone]   Adonis Housekeeper, M.D.  Internal Medicine Resident-[facility]  [place] [address]  Fax: [phone]

## 2010-09-24 NOTE — Consult Note (Signed)
NAME:  Brooke Floyd, Brooke Floyd                         ACCOUNT NO.:  [id number]   MEDICAL RECORD NO.:  [id number]                   PATIENT TYPE:  INP   LOCATION:  5533                                 FACILITY:  [facility]   PHYSICIAN:  Sharlet Salina T. Hoxworth, M.D.          DATE OF BIRTH:  1965-06-17   DATE OF CONSULTATION:  11/18/2003  DATE OF DISCHARGE:                                   CONSULTATION   CHIEF COMPLAINT:  Right buttock pain and swelling.   HISTORY OF PRESENT ILLNESS:  I was asked by Dr. Harvie Junior to evaluate Ms.  [name].  She is a 45 year old diabetic black female who presents with a four  to five day history of increasing pain, swelling, and tenderness over the  right buttock.  She was admitted yesterday and started on IV vancomycin.  She actually feels worse today with increasing pain.  She has a history of  left buttock abscess secondary to MRSA, and underwent I&D in May 2005.  She  denies fever or chills.  No other current areas of skin pain, swelling,  drainage.   PAST MEDICAL HISTORY:  1. Surgical is significant as above.  2. She is treated for adult onset diabetes mellitus.  3. History of mild hypertension.  4. Sickle cell trait.  5. Elevated lipids.  6. Asthma.   CURRENT MEDICATIONS:  1. Lipitor.  2. Diflucan, started this admission for thrush.  3. Advair discus.  4. Insulin.  5. IV vancomycin this admission.   ALLERGIES:  1. PENICILLIN.  2. VOLTAREN.   SOCIAL HISTORY:  She is divorced.  Denies cigarette or alcohol use.   FAMILY HISTORY:  Significant for diabetes and hypertension.   REVIEW OF SYSTEMS:  GENERAL:  No fever or chills.  RESPIRATORY:  No  shortness of breath currently.  GASTROINTESTINAL:  No change in bowel  movements, bleeding, pain with bowel movements.   PHYSICAL EXAMINATION:  VITAL SIGNS:  She is afebrile, pulse 90, blood  pressure is 122/60.  GENERAL:  She is an alert, well-developed black female in no acute distress.  SKIN:  Generally warm  and dry.  See extremities.  HEENT:  No scleral icterus.  No mass or thyromegaly.  LUNGS:  Clear to auscultation without increased work of breathing or  wheezing.  CARDIAC:  Regular rate and rhythm, without murmurs.  ABDOMEN:  Soft, nontender.  EXTREMITIES:  Overlying the mid right buttock is approximately a 10 x 6 cm  area of erythema, swelling, and tenderness.  There is a central very tight  raised area of 3 to 4 cm in diameter that is exquisitely tender with a small  blister over the top of this.  This inflammation does not extend to the  perirectal space.   LABORATORY DATA:  White count is 5000, hemoglobin 9.6.  Electrolytes normal.  Glucose 145.   CT scan of pelvis is reviewed which shows an apparent phlegmon of the right  gluteal  area and the subcutaneous tissue without definite abscess formation.   ASSESSMENT AND PLAN:  A 45 year old diabetic female with soft tissue  infection of the right buttock.  Has a history of methicillin-resistant  Staphylococcus aureus.  This is clinically worsening on intravenous  vancomycin.  Despite the CT scan showing no definite abscess, I think she  has significant soft tissue infection with possible early tissue necrosis  here.  I certainly think she needs incision, drainage, and debridement of  this area.  The patient is in agreement.  We will plan to proceed with this  later today.                                               Lorne Skeens. Hoxworth, M.D.    Tory Emerald  D:  11/18/2003  T:  11/18/2003  Job:  [id number]

## 2010-09-24 NOTE — Op Note (Signed)
NAME:  Brooke Floyd, Brooke Floyd                         ACCOUNT NO.:  [id number]   MEDICAL RECORD NO.:  [id number]                   PATIENT TYPE:  INP   LOCATION:  5533                                 FACILITY:  [facility]   PHYSICIAN:  Sharlet Salina T. Hoxworth, M.D.          DATE OF BIRTH:  March 08, 1966   DATE OF PROCEDURE:  11/18/2003  DATE OF DISCHARGE:                                 OPERATIVE REPORT   PREOPERATIVE DIAGNOSES:  Abscess and cellulitis right buttock.   POSTOPERATIVE DIAGNOSES:  Abscess and cellulitis right buttock.   PROCEDURE:  I&D and debridement of skin and subcu right buttock.   SURGEON:  Lorne Skeens. Hoxworth, M.D.   ANESTHESIA:  General.   BRIEF HISTORY:  [name] is a 45 year old diabetic female who presents with  progressive redness, swelling and pain in the right buttock. She has a  history of MRSA infection. Exam reveals a 6 x 10 cm area of erythema and  induration with a central very firm raised exquisitely tender area and a  small area of blistering. She has been worsening on IV vancomycin for 24  hours. Incision, drainage and debridement have been recommended and  accepted. The nature of the procedure, indications and risks of bleeding and  infection were discussed and understood. She is now brought to the operating  room for this procedure.   DESCRIPTION OF PROCEDURE:  The patient was brought to the operating room,  placed in supine position on the operating table and general endotracheal  anesthesia was induced. She was carefully positioned in lithotomy position  and the right buttock sterilely prepped and draped. I initially made a small  stab incision into the center of the area and there was frank purulent  drainage which was cultured. An about 1 1/2 cm button  of skin and  subcutaneous tissue was then excised down into a cavity. There was also  fairly diffuse oozing of purulent material from the surrounding subcutaneous  tissue and along a tract extending a  little more laterally. The skin and  subcu was opened laterally along this tract and further subcu was excised  back to healthy appearing tissue.  There was no further pus expressed from  any deeper or lateral tissues.  The open wound measured about 3 x 1 cm.  Hemostasis was obtained with the cautery. The soft tissue was infiltrated  with Marcaine. The wound was then packed with moist saline sterile gauze.  Sponge, needle and instrument counts were correct. The patient was taken to  recovery in good condition.                                               Lorne Skeens. Hoxworth, M.D.    Tory Emerald  D:  11/18/2003  T:  11/19/2003  Job:  [id number]

## 2010-09-24 NOTE — Discharge Summary (Signed)
NAME:  Brooke Floyd, Brooke Floyd                         ACCOUNT NO.:  [id number]   MEDICAL RECORD NO.:  [id number]                   PATIENT TYPE:  INP   LOCATION:  5533                                 FACILITY:  [facility]   PHYSICIAN:  Alvester Morin, M.D.               DATE OF BIRTH:  12-31-1965   DATE OF ADMISSION:  11/17/2003  DATE OF DISCHARGE:  11/21/2003                                 DISCHARGE SUMMARY   PRIMARY CARE PHYSICIAN:  Dr. Adonis Housekeeper at the outpatient clinic.   CONSULT:  Surgical consult -- Dr. Sharlet Salina T. Hoxworth, who is a Careers adviser.   DISCHARGE DIAGNOSES:  1. Right buttock cellulitis most likely secondary to methicillin-resistant     Staphylococcus aureus, status post incision, drainage and debridement.  2. Oral thrush.  3. Iron deficiency anemia.  4. Type 2 diabetes, hemoglobin A1c 6.1.  5. Bronchial asthma.  6. Hypertension.  7. Hyperlipidemia.   DISCHARGE MEDICATIONS:  1. Vancomycin IV 1500 mg daily via the PICC line for 2 weeks (until December 03, 2003).  2. Lipitor 40 mg p.o. nightly.  3. Aspirin 81 mg p.o. daily.  4. Advair 100/50 mcg 1 puff b.i.d., rinse with mouthwash after every use.  5. Albuterol 1-2 puffs as needed up to 4 times a day for bronchial asthma.  6. Slow-releasing iron p.o. t.i.d. with orange juice or vitamin C.  7. Bactroban nasal ointment in each nostril b.i.d. for 7 days.  8. Lantus 20 units in the morning.  9. Glucotrol XL 10 mg p.o. daily.  10.      Avandia 4 mg p.o. nightly.  11.      Lisinopril 20 mg p.o. daily.  12.      Vicodin 1-2 tablets every 6 hours as needed for pain.   ALLERGIES:  Patient is allergic to PENICILLIN (convulsion as an infant) and  then VOLTAREN, which gives itching.   DISPOSITION AND FOLLOWUP:  The patient is to follow up.  The patient will  continue to receive IV vancomycin via the PICC line at [facility] from  Monday through Friday and at the [facility] on Saturday  and Sunday.  She is to  receive the antibiotic until December 03, 2003.  She will  follow up with Dr. Liliane Channel on December 01, 2003 at 11 a.m. for a hospital  followup visit.  Finally, she will follow up with Dr. Johna Sheriff on December 05, 2003 at 4 p.m.  She is to continue moist saline dressings, to be changed  every day until her appointment with the surgeon.   PROCEDURES DONE:  1. CT scan of the pelvis done on November 17, 2003 showed some skin thickening     with some edema in the subcutaneous fat of right buttock.  No definite     abscess was seen at that time.  Area of scarring and skin thickening of  the left buttock was present in the region of previous abscess noted on     Oct 01, 2003.  2. Incision and drainage and debridement of skin and subcutaneous of the     right buttock was performed by Dr. Glenna Fellows on November 18, 2003.   ADMISSION HISTORY AND PHYSICAL:  Brooke Floyd is a 45 year old African American  female with a past medical history of left buttock MRSA abscess in May 2005,  as well as diabetes, hypertension and hyperlipidemia, who developed a bump  on the right buttock 4 days ago.  Since then, the swelling has worsened.  The patient has tried salt water soaks, hydrogen peroxide and over-the-  counter ointment without any relief.  Since she had a past medical history  of MRSA abscess 1 month ago, she was admitted for possible I&D of the right  buttock abscess.   ADMISSION VITAL SIGNS:  Pulse 92, blood pressure 121/68, temperature 97,  respirations 20, O2 saturations 90% on room air, weight 125.6.   PERTINENT POSITIVE EXAM FINDINGS:  Oropharynx had thrush.  The right buttock  had a 10 x 6-cm indurated, red, warm lesion with 2 boils on the surface, no  drainage, no fluctuance noted.   ADMISSION LABORATORIES:  Sodium 139, potassium 4.1, chloride 103, bicarb 23,  BUN 13, creatinine 0.6, glucose 87.  Hemoglobin 11.4 with MCV 90.4, white  blood cell count 5.9 with ANC 4.2 and platelets 426,000, bilirubin  0.4,  alkaline phosphatase 42, SGOT 19, SGPT 21, protein 7.9, albumin 4.9, calcium  9.4.   HOSPITAL COURSE:  PROBLEM #1 - RIGHT GLUTEAL CELLULITIS VERSUS ABSCESS:  This patient had a history of MRSA abscess on the left buttock 1 month prior  to admission.  A CT scan of the pelvis was obtained with the findings as  indicated above.  A surgical consult was obtained because of prior history  of MRSA and even though CT scan did not show any definite abscess, the  patient did receive irrigation and drainage and debridement of the area  because of significant soft tissue infection with possible early tissue  necrosis; this was performed by Dr. Johna Sheriff; the patient was also started  on IV vancomycin.  Wound cultures were obtained and were positive for Staph  aureus.  The culture sensitivities are pending.  The patient is to receive  IV vancomycin for another 2 weeks until December 03, 2003 via the PICC line from  [place]; following that, she will receive 1 month of oral antibiotics,  depending on culture sensitivities.  She is also to receive nasal Bactroban  to remove nasal carriage, since she works in the hospital setting and could  have had a hospital-acquired MRSA.  Finally, the patient has been instructed  on using antibacterial soap and cleaning all linen bed sheets/clothes upon  arrival at home.   PROBLEM #2 - ORAL THRUSH;  The patient was successfully treated with oral  fluconazole and has been advised to thoroughly rinse her mouth with a  mouthwash after use of Advair.   PROBLEM #3 - IRON DEFICIENCY ANEMIA:  The patient received an extensive  anemia workup during admission of May 2005 with normal RBC folate and B12  levels.  The iron panel indicated iron deficiency anemia, since total iron  was 18 with percent saturation of 7.  Patient was started on slow-releasing  iron and has been advised to continue taking it and a reticulocyte count can be obtained as an outpatient to assure  good  bone marrow response.   PROBLEM #4 - DIABETES:  Hemoglobin A1c was obtained in July and was 6.1.  The patient was continued on Lantus and sliding-scale insulin for tight  glycemic control.  She is to resume home regimen on discharge.   PROBLEM #5 - BRONCHIAL ASTHMA:  The patient did not have any exacerbations.  She was continued on Advair and albuterol p.r.n.   PROBLEM #6 - HYPERLIPIDEMIA:  Fasting lipid panel was obtained and was as  follows:  Cholesterol 138, triglycerides 34, LDL 63, HDL 68, thus, the  patient is to continue her current regimen of Lipitor 40 mg at bedtime.   PROBLEM #7 - HYPERTENSION:  Blood pressure was stable on home regimen of  lisinopril.   DISCHARGE LABORATORIES:  Sodium 137, potassium 3.8, chloride 105, bicarb 29,  BUN 7, creatinine 0.5, glucose 90.  Hemoglobin 9.9, white blood count 4.0,  platelet count was 398,000 and MCV of 90.4 and RDW 13.5.   DISCHARGE VITALS:  Temperature 98, pulse 70, respirations 20, blood pressure  109/57, O2 saturation 96% on room air.   INSTRUCTIONS ON DISCHARGE:  Patient has been advised on using an  antibacterial soap.  She has been advised to not touch the area and to  always clean her hands if she ever touches that area.  She has also been  advised to clean all bed sheets, pillowcases, clothes prior to arrival to  home.  She has also been advised to continue taking her iron pills, along  with orange juice or vitamin C.      Ellie Lunch, M.D.                         Alvester Morin, M.D.    BP/MEDQ  D:  11/21/2003  T:  11/21/2003  Job:  [id number]   cc:   Adonis Housekeeper, M.D.  Internal Medicine Resident-[facility]  [place] [address]  Fax: [phone]   Lorne Skeens. Hoxworth, M.D.  [address], [address]  [place]  [place] [address]  Fax: [phone]

## 2011-01-03 ENCOUNTER — Telehealth: Payer: Self-pay | Admitting: Internal Medicine

## 2011-01-03 NOTE — Telephone Encounter (Signed)
I have left sample of ventolin and dulera up front. Spoke with pt and notified this was done.

## 2011-08-29 ENCOUNTER — Telehealth: Payer: Self-pay | Admitting: Internal Medicine

## 2011-08-29 NOTE — Telephone Encounter (Signed)
Contacted patient, left sample up front for patient to pick up. Patient is aware.

## 2011-10-11 ENCOUNTER — Encounter: Payer: Self-pay | Admitting: Internal Medicine

## 2011-10-11 ENCOUNTER — Ambulatory Visit (INDEPENDENT_AMBULATORY_CARE_PROVIDER_SITE_OTHER): Payer: Self-pay | Admitting: Internal Medicine

## 2011-10-11 VITALS — BP 144/84 | HR 116 | Ht 62.0 in | Wt 144.8 lb

## 2011-10-11 DIAGNOSIS — J45909 Unspecified asthma, uncomplicated: Secondary | ICD-10-CM

## 2011-10-11 DIAGNOSIS — R040 Epistaxis: Secondary | ICD-10-CM

## 2011-10-11 NOTE — Patient Instructions (Addendum)
When you are having frequent nose bleeds, stay off the aspirin for a few days.  Try a squirt of Afrin nasal decongestant to clamp down the blood vessels.  Sample Proair rescue inhaler  2 puffs, up to 4 times daily as needed  Soap and water and avoid pressure at your armpit. If it continues to bother you we can call in an antibiotic.

## 2011-10-11 NOTE — Progress Notes (Signed)
Subjective:     Patient ID: Brooke Floyd, female   DOB: Jun 19, 1965, 46 y.o.   MRN: [id number]  HPI 76 yoF never smoker, followed for asthma complicated by GERD. Husband here. Last here February 02, 2010  Acute illness- Began about 9-10 days ago with productive cough, aching, chills, sore throat. Treated herself with otc decongestant and throat spray. She had done well with Dulera 100 until this. Husband had a similar illness shortly before. She is frustrated by tussive stress incontinence.  10/11/11- 45 yoF never smoker, followed for asthma complicated by GERD. Husband here. Denies any SOB, wheezing, cough, or congestion at this time Occasional epistaxis right nostril. Increased asthma recently blamed on whether change. No insurance so having problems with medications but has been using pro-air and Dulera.  ROS-see HPI Constitutional:   No-   weight loss, night sweats, fevers, chills, fatigue, lassitude. + tender left axilla HEENT:   No-  headaches, difficulty swallowing, tooth/dental problems, sore throat,       No-  sneezing, itching, ear ache,+ nasal congestion, post nasal drip,  CV:  No-   chest pain, orthopnea, PND, swelling in lower extremities, anasarca, dizziness, palpitations Resp: No-   shortness of breath with exertion or at rest.              No-   productive cough,  No non-productive cough,  No- coughing up of blood.              No-   change in color of mucus.  Little wheezing.   Skin: No-   rash or lesions. GI:  No-   heartburn, indigestion, abdominal pain, nausea, vomiting,  GU:  MS:  No-   joint pain or swelling.  . Neuro-     nothing unusual Psych:  No- change in mood or affect. No depression or anxiety.  No memory loss.  OBJ- Physical Exam General- Alert, Oriented, Affect-appropriate, Distress- none acute Skin- rash-none, lesions- none, excoriation- none Lymphadenopathy- tender L axilla c/w hydradinitis Head- atraumatic            Eyes- Gross vision intact,  PERRLA, conjunctivae and secretions clear            Ears- Hearing, canals-normal            Nose- Clear, no-Septal dev, mucus, polyps, erosion, perforation             Throat- Mallampati II , mucosa clear , drainage- none, tonsils- atrophic Neck- flexible , trachea midline, no stridor , thyroid nl, carotid no bruit Chest - symmetrical excursion , unlabored           Heart/CV- RRR , no murmur , no gallop  , no rub, nl s1 s2                           - JVD- none , edema- none, stasis changes- none, varices- none           Lung- clear to P&A, wheeze- none, cough- none , dullness-none, rub- none           Chest wall-  Abd-  Br/ Gen/ Rectal- Not done, not indicated Extrem- cyanosis- none, clubbing, none, atrophy- none, strength- nl Neuro- grossly intact to observation

## 2011-10-16 DIAGNOSIS — R040 Epistaxis: Secondary | ICD-10-CM | POA: Insufficient documentation

## 2011-10-16 NOTE — Assessment & Plan Note (Signed)
Benign pattern at this point. Discussed simple measures.

## 2011-10-16 NOTE — Assessment & Plan Note (Signed)
Good control currently. Some aggravation by weather changes. Biggest problem is cost of meds. Plan-sample Proair

## 2011-10-31 ENCOUNTER — Telehealth: Payer: Self-pay | Admitting: Internal Medicine

## 2011-10-31 NOTE — Telephone Encounter (Signed)
I spoke with pt and she states that Florentina Addison was suppose to submit her paperwork for patient assistance. Pt is calling and checking on this. Please advise thanks

## 2011-10-31 NOTE — Telephone Encounter (Signed)
Spoke with pt; aware that I have not heard anything back about Dulera from [employer] and will need to contact them about the pt assistance program; Pt is aware that her ProAir HFA pt assistance came in and will pick up tomorrow.

## 2011-12-25 ENCOUNTER — Emergency Department (HOSPITAL_COMMUNITY)
Admission: EM | Admit: 2011-12-25 | Discharge: 2011-12-25 | Disposition: A | Discharge status: Home / Self Care | Payer: No Typology Code available for payment source | Attending: Emergency Medicine | Admitting: Emergency Medicine

## 2011-12-25 ENCOUNTER — Encounter (HOSPITAL_COMMUNITY): Payer: Self-pay | Admitting: Emergency Medicine

## 2011-12-25 ENCOUNTER — Emergency Department (HOSPITAL_COMMUNITY): Payer: No Typology Code available for payment source

## 2011-12-25 DIAGNOSIS — Z794 Long term (current) use of insulin: Secondary | ICD-10-CM | POA: Insufficient documentation

## 2011-12-25 DIAGNOSIS — R51 Headache: Secondary | ICD-10-CM | POA: Insufficient documentation

## 2011-12-25 DIAGNOSIS — Z79899 Other long term (current) drug therapy: Secondary | ICD-10-CM | POA: Insufficient documentation

## 2011-12-25 DIAGNOSIS — T148XXA Other injury of unspecified body region, initial encounter: Secondary | ICD-10-CM

## 2011-12-25 DIAGNOSIS — J45909 Unspecified asthma, uncomplicated: Secondary | ICD-10-CM | POA: Insufficient documentation

## 2011-12-25 DIAGNOSIS — K219 Gastro-esophageal reflux disease without esophagitis: Secondary | ICD-10-CM | POA: Insufficient documentation

## 2011-12-25 DIAGNOSIS — I10 Essential (primary) hypertension: Secondary | ICD-10-CM | POA: Insufficient documentation

## 2011-12-25 DIAGNOSIS — E119 Type 2 diabetes mellitus without complications: Secondary | ICD-10-CM | POA: Insufficient documentation

## 2011-12-25 HISTORY — DX: Gastro-esophageal reflux disease without esophagitis: K21.9

## 2011-12-25 HISTORY — DX: Essential (primary) hypertension: I10

## 2011-12-25 MED ORDER — OXYCODONE-ACETAMINOPHEN 5-325 MG PO TABS
1.0000 | ORAL_TABLET | Freq: Once | ORAL | Status: AC
  Administered 2011-12-25: 1 via ORAL
  Filled 2011-12-25: qty 1

## 2011-12-25 MED ORDER — METHOCARBAMOL 500 MG PO TABS: 1000.0000 mg | ORAL_TABLET | Freq: Four times a day (QID) | ORAL | Status: AC | PRN

## 2011-12-25 MED ORDER — HYDROCODONE-ACETAMINOPHEN 5-325 MG PO TABS: ORAL_TABLET | ORAL | Status: AC
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
## 2011-12-25 NOTE — ED Notes (Signed)
Pt asked if she would change into gown, warm blanket provided.
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
## 2012-02-28 ENCOUNTER — Telehealth: Payer: Self-pay | Admitting: Internal Medicine

## 2012-02-28 NOTE — Telephone Encounter (Signed)
Spoke with pt. She states for the past several wks having increased SOB and has needed to use her rescue inhaler several times per day with little relief. She was seen by her PCP already and they rec that she see CDY. Pt wants appt either on a Friday or Monday pm since this is only time she can come in. No appts open. Please advise thanks! Allergies  Allergen Reactions  . Diclofenac Sodium Itching  . Penicillins Other (See Comments)    convulsions

## 2012-02-28 NOTE — Telephone Encounter (Signed)
Left message on machine for patient to return call in regards to scheduling her appt that she requested.  Per CY, can schedule in RNA slot that is available.  Patient is requesting to be seen after 2pm on Friday id available.

## 2012-02-29 NOTE — Telephone Encounter (Signed)
[email or web address].Jennifer Castillo, CMA  

## 2012-02-29 NOTE — Telephone Encounter (Signed)
Pt returned call.  Set pt w/ CY 03/02/12 @ 4:15 pm (RNA slot) as per CY's directive.  Pt verbalized understanding & stated nothing further needed at this time.  Brooke Floyd

## 2012-03-02 ENCOUNTER — Encounter: Payer: Self-pay | Admitting: Internal Medicine

## 2012-03-02 ENCOUNTER — Ambulatory Visit (INDEPENDENT_AMBULATORY_CARE_PROVIDER_SITE_OTHER): Payer: Self-pay | Admitting: Internal Medicine

## 2012-03-02 VITALS — BP 140/78 | HR 110 | Ht 62.0 in | Wt 142.0 lb

## 2012-03-02 DIAGNOSIS — J45901 Unspecified asthma with (acute) exacerbation: Secondary | ICD-10-CM

## 2012-03-02 DIAGNOSIS — K219 Gastro-esophageal reflux disease without esophagitis: Secondary | ICD-10-CM

## 2012-03-02 MED ORDER — MOMETASONE FURO-FORMOTEROL FUM 200-5 MCG/ACT IN AERO: 2.0000 | INHALATION_SPRAY | Freq: Two times a day (BID) | RESPIRATORY_TRACT | Status: DC

## 2012-03-02 MED ORDER — MONTELUKAST SODIUM 10 MG PO TABS: 10.0000 mg | ORAL_TABLET | Freq: Every day | ORAL | Status: DC

## 2012-03-02 MED ORDER — ALBUTEROL SULFATE HFA 108 (90 BASE) MCG/ACT IN AERS: 2.0000 | INHALATION_SPRAY | Freq: Four times a day (QID) | RESPIRATORY_TRACT | Status: DC | PRN
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
## 2012-03-02 NOTE — Patient Instructions (Addendum)
Sample Dulera 200  2 puffs then rinse mouth, twice daily everyday. When this is used up go back to the [employer] 100  Script for Singulair/ montelukast  1 daily as an airway stabilizer
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
## 2012-03-14 NOTE — Assessment & Plan Note (Signed)
Discussed reflux precautions.

## 2012-03-28 ENCOUNTER — Telehealth: Payer: Self-pay | Admitting: Internal Medicine

## 2012-03-28 NOTE — Telephone Encounter (Signed)
ATC patient-no answer and unable to leave a message. Will need to try again tomorrow. Need to know which cough syrup pt is speaking of.

## 2012-03-29 MED ORDER — HYDROCODONE-HOMATROPINE 5-1.5 MG/5ML PO SYRP: 5.0000 mL | ORAL_SOLUTION | Freq: Four times a day (QID) | ORAL | Status: DC | PRN

## 2012-03-29 MED ORDER — AZITHROMYCIN 250 MG PO TABS: ORAL_TABLET | ORAL | Status: DC

## 2012-03-29 NOTE — Telephone Encounter (Signed)
Called and spoke with pt and she is aware of CY recs of the zpak #1  And the cough syrup that has been called to the pharmacy for the pt.  Pt is aware and nothing further is needed.

## 2012-03-29 NOTE — Telephone Encounter (Signed)
Pt called back & states she was up all night coughing & would really like a Rx called in asap.  The cough syrup she has taken previously is the hydrocodone-homatropine.  Pt states this is what worked well for her in the past.  Pt's pharmacy is [facility] on [address].  Antionette Fairy

## 2012-03-29 NOTE — Telephone Encounter (Signed)
Please advise if abx can be called in for the sinus issues.  Thanks  Allergies  Allergen Reactions  . Diclofenac Sodium Itching  . Penicillins Other (See Comments)    convulsions

## 2012-03-29 NOTE — Telephone Encounter (Signed)
Pt returned call. Brooke Floyd °

## 2012-03-29 NOTE — Telephone Encounter (Signed)
Pt stated that she has been up all night coughing and is requesting that hydrocodone cough syrup be called to her pharmacy.  CY please advise. Thanks  Last ov--03/02/2012 Next ov--06/04/2012  Allergies  Allergen Reactions  . Diclofenac Sodium Itching  . Penicillins Other (See Comments)    convulsions

## 2012-03-29 NOTE — Telephone Encounter (Signed)
Pt thinks she also has sinus infection wants to talk to the nurse

## 2012-03-29 NOTE — Telephone Encounter (Signed)
Per CY okay to give Zpak #1 take as directed no refills. 

## 2012-03-29 NOTE — Telephone Encounter (Signed)
Per CY-okay to refill cough syrup as requested.  

## 2012-03-29 NOTE — Telephone Encounter (Signed)
I spoke with pt and she c/o cough w/ yellow-brown phlem, wheezing, chest tx, facial pressure, HA, neck pain. Started yesterday Pt just picked mucinex DM to start taking. She would like something called in for this plus cough syrup Please advise Dr. Maple Hudson thanks  Allergies  Allergen Reactions  . Diclofenac Sodium Itching  . Penicillins Other (See Comments)    convulsions

## 2012-04-03 ENCOUNTER — Other Ambulatory Visit (HOSPITAL_COMMUNITY): Payer: Self-pay | Admitting: Family Medicine

## 2012-04-12 ENCOUNTER — Other Ambulatory Visit (HOSPITAL_COMMUNITY): Payer: Self-pay | Admitting: Family Medicine

## 2012-04-12 DIAGNOSIS — Z1231 Encounter for screening mammogram for malignant neoplasm of breast: Secondary | ICD-10-CM

## 2012-05-04 ENCOUNTER — Telehealth: Payer: Self-pay | Admitting: Internal Medicine

## 2012-05-04 MED ORDER — MOMETASONE FURO-FORMOTEROL FUM 100-5 MCG/ACT IN AERO: 2.0000 | INHALATION_SPRAY | Freq: Two times a day (BID) | RESPIRATORY_TRACT | Status: DC

## 2012-05-04 NOTE — Telephone Encounter (Signed)
Spoke w patient, requesting sample of Dulera.  Sample has been left upfront for pick up and patient is aware.  Nothing further needed at this time.

## 2012-05-15 ENCOUNTER — Ambulatory Visit (HOSPITAL_COMMUNITY)
Admission: RE | Admit: 2012-05-15 | Discharge: 2012-05-15 | Disposition: A | Discharge status: Home / Self Care | Payer: Self-pay | Source: Ambulatory Visit | Attending: Family Medicine | Admitting: Family Medicine

## 2012-05-15 DIAGNOSIS — Z1231 Encounter for screening mammogram for malignant neoplasm of breast: Secondary | ICD-10-CM

## 2012-06-04 ENCOUNTER — Ambulatory Visit (INDEPENDENT_AMBULATORY_CARE_PROVIDER_SITE_OTHER): Payer: Self-pay | Admitting: Internal Medicine

## 2012-06-04 ENCOUNTER — Encounter: Payer: Self-pay | Admitting: Internal Medicine

## 2012-06-04 VITALS — BP 120/80 | HR 125 | Ht 62.0 in | Wt 134.4 lb

## 2012-06-04 DIAGNOSIS — J45901 Unspecified asthma with (acute) exacerbation: Secondary | ICD-10-CM

## 2012-06-04 MED ORDER — MOMETASONE FURO-FORMOTEROL FUM 100-5 MCG/ACT IN AERO: 2.0000 | INHALATION_SPRAY | Freq: Two times a day (BID) | RESPIRATORY_TRACT | Status: DC

## 2012-06-04 NOTE — Patient Instructions (Addendum)
Sample Dulera 100 x 2     2 puffs, twice daily until you are seen at [facility] clinic  Flu vax
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
## 2012-06-13 ENCOUNTER — Ambulatory Visit: Payer: Self-pay

## 2012-06-13 ENCOUNTER — Ambulatory Visit (INDEPENDENT_AMBULATORY_CARE_PROVIDER_SITE_OTHER): Payer: Self-pay | Admitting: Internal Medicine

## 2012-06-13 ENCOUNTER — Encounter: Payer: Self-pay | Admitting: Internal Medicine

## 2012-06-13 VITALS — BP 116/80 | HR 110 | Temp 97.3°F | Ht 62.0 in | Wt 134.1 lb

## 2012-06-13 DIAGNOSIS — Z79899 Other long term (current) drug therapy: Secondary | ICD-10-CM

## 2012-06-13 DIAGNOSIS — K219 Gastro-esophageal reflux disease without esophagitis: Secondary | ICD-10-CM

## 2012-06-13 DIAGNOSIS — R51 Headache: Secondary | ICD-10-CM

## 2012-06-13 DIAGNOSIS — N644 Mastodynia: Secondary | ICD-10-CM

## 2012-06-13 DIAGNOSIS — R29818 Other symptoms and signs involving the nervous system: Secondary | ICD-10-CM

## 2012-06-13 DIAGNOSIS — R000 Tachycardia, unspecified: Secondary | ICD-10-CM

## 2012-06-13 DIAGNOSIS — M549 Dorsalgia, unspecified: Secondary | ICD-10-CM

## 2012-06-13 DIAGNOSIS — J45909 Unspecified asthma, uncomplicated: Secondary | ICD-10-CM

## 2012-06-13 DIAGNOSIS — E785 Hyperlipidemia, unspecified: Secondary | ICD-10-CM

## 2012-06-13 DIAGNOSIS — E119 Type 2 diabetes mellitus without complications: Secondary | ICD-10-CM

## 2012-06-13 DIAGNOSIS — I10 Essential (primary) hypertension: Secondary | ICD-10-CM

## 2012-06-13 LAB — COMPLETE METABOLIC PANEL WITH GFR
ALT: 17 U/L (ref 0–35)
AST: 13 U/L (ref 0–37)
Albumin: 4.4 g/dL (ref 3.5–5.2)
Alkaline Phosphatase: 60 U/L (ref 39–117)
BUN: 14 mg/dL (ref 6–23)
Potassium: 4.2 mEq/L (ref 3.5–5.3)
Sodium: 135 mEq/L (ref 135–145)
Total Protein: 7.2 g/dL (ref 6.0–8.3)

## 2012-06-13 LAB — TSH: TSH: 3.175 u[IU]/mL (ref 0.350–4.500)

## 2012-06-13 LAB — LIPID PANEL
LDL Cholesterol: 107 mg/dL — ABNORMAL HIGH (ref 0–99)
VLDL: 18 mg/dL (ref 0–40)

## 2012-06-13 LAB — GLUCOSE, CAPILLARY: Glucose-Capillary: 221 mg/dL — ABNORMAL HIGH (ref 70–99)

## 2012-06-13 MED ORDER — QUINAPRIL HCL 20 MG PO TABS: 40.0000 mg | ORAL_TABLET | Freq: Every day | ORAL | Status: DC

## 2012-06-13 MED ORDER — TOPIRAMATE 50 MG PO TABS: 100.0000 mg | ORAL_TABLET | Freq: Two times a day (BID) | ORAL | Status: DC

## 2012-06-13 MED ORDER — AMLODIPINE BESYLATE 5 MG PO TABS: 5.0000 mg | ORAL_TABLET | Freq: Every day | ORAL | Status: DC

## 2012-06-13 MED ORDER — CYCLOBENZAPRINE HCL 10 MG PO TABS: 10.0000 mg | ORAL_TABLET | Freq: Every day | ORAL | Status: DC | PRN

## 2012-06-13 MED ORDER — HYDROCHLOROTHIAZIDE 12.5 MG PO TABS: 12.5000 mg | ORAL_TABLET | Freq: Every day | ORAL | Status: DC

## 2012-06-13 MED ORDER — TOPIRAMATE 50 MG PO TABS: 50.0000 mg | ORAL_TABLET | Freq: Two times a day (BID) | ORAL | Status: DC

## 2012-06-13 NOTE — Patient Instructions (Addendum)
-  Follow up with Brooke Floyd for the Brooke Floyd application  -Your Hg A1C is 9%, your diabetes in uncontrolled.   -You need to check your blood sugar twice per day, before breakfast and after lunch. Write these down, bring your blood glucose meter with you during your next visit.   -Write down how many times per week you are using your rescue inhaler and bring this information with you during your next appointment.   -You need an appointment with Brooke Floyd, our Diabetes Educator  -Today we discontinued amlodipine (Norvasc) but you need to follow up in two weeks with [place] to recheck your blood pressure  -Today we decreased the Topamax to 50mg  twice per day (total of 100mg  per day)

## 2012-06-13 NOTE — Assessment & Plan Note (Signed)
Fortunately she does not seem to have had a viral infection. We discussed access to medications and options. Plan-flu vaccine. Dulera 100, #2 samples until she can fill prescription

## 2012-06-14 ENCOUNTER — Ambulatory Visit: Payer: Self-pay

## 2012-06-14 DIAGNOSIS — R519 Headache, unspecified: Secondary | ICD-10-CM | POA: Insufficient documentation

## 2012-06-14 DIAGNOSIS — N644 Mastodynia: Secondary | ICD-10-CM | POA: Insufficient documentation

## 2012-06-14 DIAGNOSIS — E785 Hyperlipidemia, unspecified: Secondary | ICD-10-CM | POA: Insufficient documentation

## 2012-06-14 DIAGNOSIS — R000 Tachycardia, unspecified: Secondary | ICD-10-CM | POA: Insufficient documentation

## 2012-06-14 DIAGNOSIS — I10 Essential (primary) hypertension: Secondary | ICD-10-CM | POA: Insufficient documentation

## 2012-06-14 LAB — MICROALBUMIN / CREATININE URINE RATIO
Creatinine, Urine: 104.4 mg/dL
Microalb Creat Ratio: 12.1 mg/g (ref 0.0–30.0)

## 2012-06-14 LAB — CBC
HCT: 35.8 % — ABNORMAL LOW (ref 36.0–46.0)
Hemoglobin: 11.9 g/dL — ABNORMAL LOW (ref 12.0–15.0)
MCH: 30.1 pg (ref 26.0–34.0)
MCHC: 33.2 g/dL (ref 30.0–36.0)
RBC: 3.96 MIL/uL (ref 3.87–5.11)

## 2012-06-14 LAB — URINALYSIS, ROUTINE W REFLEX MICROSCOPIC
Glucose, UA: 250 mg/dL — AB
Hgb urine dipstick: NEGATIVE
Ketones, ur: NEGATIVE mg/dL
Leukocytes, UA: NEGATIVE
Nitrite: NEGATIVE
Specific Gravity, Urine: 1.02 (ref 1.005–1.030)
pH: 5.5 (ref 5.0–8.0)

## 2012-06-14 NOTE — Assessment & Plan Note (Signed)
She is on flexeril 10mg  daily PRN for pain.

## 2012-06-14 NOTE — Assessment & Plan Note (Addendum)
BP of 116/80 today. She is on accupril, hydrochlorothiazide, and amlodipine. Amlodipine was recently started.  -Discontinue amlodipine -COntinue HCTZ and accupril.  -Will reassess BP in two weeks--she may need to restart amlodipine  -Continue ASA 81 mg daily -BMET

## 2012-06-14 NOTE — Progress Notes (Signed)
  Subjective:    Patient ID: Brooke Floyd, female    DOB: 07/15/1965, 47 y.o.   MRN: [id number]  HPI Brooke Floyd is a 47 year old woman with PMH of DM2, Asthma, chronic headache, and GERD who comes in to the [facility] to establish care. She was followed by Dr. Jillyn Hidden for years but the patient states that she needed a PCP at the [facility] due to her lack of medical insurance. She is accompanied by her husband.  Today she is concerned about taking too many medications and requests to be taken off of some her current medications. She has her medications with her today. She states that she feels drowsy with Topomax with a mental fog and would like to be off this medication soon.   She did not bring CBG meter with her today but reports compliance to her insulin and glipizide. She injects Lantus 30 units at bedtime. She eats only 2 meals per day but denies recent hypoglycemia.   She complains of right sided breast pain that has been present for months, even before her recent mammogram.   She also requests that her Lantus discount program form be filled out during this visit.    Review of Systems  Constitutional: Negative for fever, chills, activity change, appetite change and fatigue.  Respiratory: Negative for cough, chest tightness, shortness of breath and wheezing.   Cardiovascular: Negative for chest pain and leg swelling.  Gastrointestinal: Negative for nausea, vomiting, abdominal pain, diarrhea and constipation.  Genitourinary: Negative for dysuria and difficulty urinating.  Neurological: Positive for dizziness and headaches. Negative for seizures and weakness.  Psychiatric/Behavioral: Positive for sleep disturbance and decreased concentration. Negative for agitation.       Objective:   Physical Exam  Nursing note and vitals reviewed. Constitutional: She is oriented to person, place, and time. No distress.       Somnolent, hard of hearing  Eyes: Conjunctivae normal are normal. No scleral icterus.   Cardiovascular: Regular rhythm and normal heart sounds.        Tachycardia with HR in ~110  Pulmonary/Chest: Effort normal and breath sounds normal. No respiratory distress. She has no wheezes. She has no rales. She exhibits no tenderness.  Abdominal: Soft. Bowel sounds are normal. She exhibits no distension. There is no tenderness.  Musculoskeletal: She exhibits no edema.  Lymphadenopathy:    She has no cervical adenopathy.  Neurological: She is alert and oriented to person, place, and time. She has normal reflexes.  Skin: Skin is warm and dry. No rash noted. She is not diaphoretic. No erythema.  Psychiatric:       Somnolent, with some difficulty recalling events          Assessment & Plan:

## 2012-06-14 NOTE — Assessment & Plan Note (Signed)
She had mammogram with nl findings but dense breast tissue recently. She describes having intermediated sharp right breast pain for months now.  -Would order US breast but pt currently dose not have medical insurance or [employer] Card--will defer ordering this study until her next visit.

## 2012-06-14 NOTE — Assessment & Plan Note (Signed)
HR initially in 110 but down to 90s after physical exam. Pt states being anxious.  -Will check TSH

## 2012-06-14 NOTE — Assessment & Plan Note (Addendum)
Stable today, not on any medication for this

## 2012-06-14 NOTE — Assessment & Plan Note (Signed)
LDL goal of 100, LDL of 107 today. Continue Zocor 80mg  daily. Referral to diabetes educator for further diet counseling.

## 2012-06-14 NOTE — Assessment & Plan Note (Addendum)
Lab Results  Component Value Date   HGBA1C 9.0 06/13/2012   CREATININE 0.72 06/13/2012   MICROALBUR 1.26 06/13/2012   MICRALBCREAT 12.1 06/13/2012   CHOL 176 06/13/2012   HDL 51 06/13/2012   TRIG 89 08/13/8293    Last eye exam and foot exam: No results found for this basename: HMDIABEYEEXA, HMDIABFOOTEX    Assessment: Diabetes control: not controlled Progress toward goals: unable to assess Barriers to meeting goals: financial need and lack of understanding of disease management  Plan: Diabetes treatment: continue current medications Refer to: diabetes educator for self-management training and diabetes educator for medical nutrition therapy Instruction/counseling given: reminded to bring blood glucose meter & log to each visit so appropriate adjustments to her insulin regimen can be made.  Check lipid panel and urine microalb/Cr

## 2012-06-14 NOTE — Assessment & Plan Note (Addendum)
She is on Topomax 50mg  2 tabs BID. Reduced her dose to 50mg  BID. According to her husband she has vivid dreams with occasional jerking movements while she sleeps since starting this medications. She reports feeling drowsy, tired, and forgetful while on this medication and requests to be weaned off this medication. Will reassess her HA with reduced dose during her next visit. Marland Kitchen

## 2012-06-14 NOTE — Assessment & Plan Note (Addendum)
Good control at this time. She is followed by Dr. Maple Hudson. She is on Dulera and Singular in addition to having albuterol inhaler which she did need recently.  -Will order PFTs once she has the [employer]

## 2012-06-15 ENCOUNTER — Ambulatory Visit: Payer: Self-pay

## 2012-06-25 ENCOUNTER — Other Ambulatory Visit: Payer: Self-pay | Admitting: *Deleted

## 2012-06-25 DIAGNOSIS — M549 Dorsalgia, unspecified: Secondary | ICD-10-CM

## 2012-06-25 MED ORDER — MOMETASONE FURO-FORMOTEROL FUM 100-5 MCG/ACT IN AERO: 2.0000 | INHALATION_SPRAY | Freq: Two times a day (BID) | RESPIRATORY_TRACT | Status: DC

## 2012-06-25 MED ORDER — CYCLOBENZAPRINE HCL 10 MG PO TABS: 10.0000 mg | ORAL_TABLET | Freq: Every day | ORAL | Status: DC | PRN

## 2012-06-25 MED ORDER — INSULIN GLARGINE 100 UNIT/ML ~~LOC~~ SOLN: 30.0000 [IU] | Freq: Every day | SUBCUTANEOUS | Status: DC

## 2012-06-25 NOTE — Telephone Encounter (Signed)
[facility] does flexeril [facility] does have lantus and dulera but will need to do paperwork before med given.

## 2012-06-25 NOTE — Telephone Encounter (Signed)
Rx called into [facility]

## 2012-06-26 ENCOUNTER — Telehealth: Payer: Self-pay | Admitting: Internal Medicine

## 2012-06-26 NOTE — Telephone Encounter (Signed)
I spoke with pt. She will call PA and see what is going on with her refills through them. Nothing further was needed

## 2012-06-26 NOTE — Telephone Encounter (Signed)
Returning call can be reached at [phone].Brooke Floyd

## 2012-06-26 NOTE — Telephone Encounter (Signed)
ATC NA unable to Leave VM bc it has not been set up yet. Looked in [facility] records show pt was giving sample by Dr. Garald Braver 06/25/12 WCB

## 2012-06-26 NOTE — Telephone Encounter (Signed)
Per pt she was not giving any sample yesterday. Pt is requesting sample of dulera. Please advise Dr. Maple Hudson thanks

## 2012-06-26 NOTE — Telephone Encounter (Signed)
Per CY-have patient use the sample she was just given and needs to check with [employer] to see if she still has refills for her patient assistance or if she needs to refill the paperwork out.

## 2012-06-27 ENCOUNTER — Encounter: Payer: Self-pay | Admitting: Dietician

## 2012-06-27 ENCOUNTER — Ambulatory Visit (INDEPENDENT_AMBULATORY_CARE_PROVIDER_SITE_OTHER): Payer: PRIVATE HEALTH INSURANCE | Admitting: Internal Medicine

## 2012-06-27 ENCOUNTER — Ambulatory Visit (INDEPENDENT_AMBULATORY_CARE_PROVIDER_SITE_OTHER): Payer: PRIVATE HEALTH INSURANCE | Admitting: Dietician

## 2012-06-27 ENCOUNTER — Encounter: Payer: Self-pay | Admitting: Internal Medicine

## 2012-06-27 VITALS — BP 132/80 | HR 115 | Temp 97.0°F | Wt 135.4 lb

## 2012-06-27 DIAGNOSIS — G8929 Other chronic pain: Secondary | ICD-10-CM

## 2012-06-27 DIAGNOSIS — J45909 Unspecified asthma, uncomplicated: Secondary | ICD-10-CM

## 2012-06-27 DIAGNOSIS — E119 Type 2 diabetes mellitus without complications: Secondary | ICD-10-CM

## 2012-06-27 DIAGNOSIS — R000 Tachycardia, unspecified: Secondary | ICD-10-CM

## 2012-06-27 DIAGNOSIS — I10 Essential (primary) hypertension: Secondary | ICD-10-CM

## 2012-06-27 DIAGNOSIS — N644 Mastodynia: Secondary | ICD-10-CM

## 2012-06-27 DIAGNOSIS — R51 Headache: Secondary | ICD-10-CM

## 2012-06-27 MED ORDER — GLIPIZIDE 5 MG PO TABS: 5.0000 mg | ORAL_TABLET | Freq: Two times a day (BID) | ORAL | Status: DC

## 2012-06-27 MED ORDER — AMITRIPTYLINE HCL 25 MG PO TABS: 25.0000 mg | ORAL_TABLET | Freq: Every day | ORAL | Status: DC

## 2012-06-27 MED ORDER — OMEPRAZOLE 20 MG PO CPDR: 20.0000 mg | DELAYED_RELEASE_CAPSULE | Freq: Every day | ORAL | Status: DC

## 2012-06-27 MED ORDER — MOMETASONE FURO-FORMOTEROL FUM 100-5 MCG/ACT IN AERO: 2.0000 | INHALATION_SPRAY | Freq: Two times a day (BID) | RESPIRATORY_TRACT | Status: DC

## 2012-06-27 NOTE — Progress Notes (Signed)
Diabetes Self-Management Training (DSMT)  Initial Visit  06/27/2012 Ms. Brooke Floyd, identified by name and date of birth, is a 47 y.o. female with Type 2 Diabetes. Year of diabetes diagnosis: 2001  ASSESSMENT Patient concerns are Problem solving and Glycemic control.  Lab Results  Component Value Date   LDLCALC 107* 06/13/2012   Component Value Date   HGBA1C 9.0 06/13/2012   Family history of diabetes: Yes- mother and brother just passed with diabetes comlications Patients belief/attitude about diabetes: Diabetes can be controlled. Self foot exams daily: Yes Diabetes Complications: None Support systems: friends Special needs: Unable to determine Prior DM Education: Yes-limited   Medications See Medications list.  Needs skills/knowledge review   Exercise Plan Doing ADLs and need to assess further at future visits .   Self-Monitoring  Monitor: advocate that we cannot download Frequency of testing: 1-2 times/day Breakfast: 178/110/77/231/95 Lunch: no readings Supper: 80/91 Bedtime: 145/187/163/272/246/208 Hyperglycemia: Yes Weekly Hypoglycemia: Yes Rarely   Meal Planning Some knowledge   Assessment comments: patients focus today was discussing her emotional issues, she was very tearful and thinking she needs help for depression. Referred her to social work for assistance with counseling.     INDIVIDUAL DIABETES EDUCATION PLAN:  Nutrition management Medication Monitoring Psychosocial adjustment _______________________________________________________________________  Intervention TOPICS COVERED TODAY:  Nutrition management  Role of diet in the treatment of diabetes and the relationship between the three main macronutritents and blood glucose control. Psychosocial adjustment  Worked with patient to identify barriers to care and solutions Identified and addressed patients feelings and concerns about diabetes  PATIENTS GOALS/PLAN (copy and paste in patient  instructions so patient receives a copy): 1.  Learning Objective:       To be deternmined- likely to know what food contain carbs  2.  Behavioral Objective:         Nutrition: To improve blood glucose control I will read for few pages on carb counting in book provided Never 0%  Personalized Follow-Up Plan for Ongoing Self Management Support:  Doctor's Office, CDE visits and social worker  ______________________________________________________________________   Outcomes Expected outcomes: Demonstrated interest in learning.Expect positive changes in lifestyle. Self-care Barriers: Lack of material resources, Coping skills, competing issues Education material provided: yes- carb counting book Patient to contact team via Phone if problems or questions. Time in: 0950     Time out: 1030 Future DSMT - 2 wks   Brooke Floyd, Brooke Floyd

## 2012-06-27 NOTE — Progress Notes (Signed)
  Subjective:    Patient ID: Brooke Floyd, female    DOB: [birth year], 47 y.o.   MRN: [id number]  HPI Ms. Markes is a 47 year old woman with PMH of DM2, GERD, HTN, Hyperlipidemia who comes in today for follow up visit for her DM2, HTN, and headache. Her headaches have worsened, especially at night but she has decreased her intake of Topamax to 50mg  daily instead of BID. Her sleep is poor secondary to the evening headaches. She continues to use her albuterol inhaler daily and used it prior to her visit today. She needs prescription for [facility].  She is partially approved for the [employer] (10%) but reports that she may have a job at [employer] soon and will be able to have [employer].  She complains of occasional pain in her thighs bilaterally that is described as a weakness. She attributes this to possible dehydration, she tries to avoid drinking fluids to prevent diuresis.    Review of Systems  Constitutional: Positive for fatigue. Negative for fever, chills, diaphoresis, activity change, appetite change and unexpected weight change.  Respiratory: Positive for shortness of breath. Negative for cough, chest tightness and wheezing.   Cardiovascular: Negative for chest pain, palpitations and leg swelling.  Genitourinary: Negative for dysuria.  Musculoskeletal: Positive for myalgias and back pain. Negative for joint swelling, arthralgias and gait problem.       Chronic back pain, occasional bilateral thigh pain  Skin: Negative for color change, pallor, rash and wound.  Neurological: Positive for headaches. Negative for dizziness and light-headedness.  Psychiatric/Behavioral: Positive for sleep disturbance. Negative for behavioral problems and agitation.       Objective:   Physical Exam  Nursing note and vitals reviewed. Constitutional: She is oriented to person, place, and time. She appears well-developed and well-nourished. No distress.  More interactive today  HENT:   Head: Atraumatic.  Eyes: Conjunctivae are normal. Right eye exhibits no discharge. Left eye exhibits no discharge. No scleral icterus.  Neck: Neck supple. No thyromegaly present.  Cardiovascular:  tachycardia  Pulmonary/Chest: Effort normal. No respiratory distress.  Musculoskeletal: Normal range of motion.  Neurological: She is alert and oriented to person, place, and time. Coordination normal.  Skin: Skin is warm and dry. She is not diaphoretic.  Psychiatric: She has a normal mood and affect.  Distracted at times with her iPad          Assessment & Plan:

## 2012-06-27 NOTE — Patient Instructions (Addendum)
-  Take your medicines as prescribed -Take Elavil at night to help your sleep and to help with your headache.  - Follow up with [place] in 2-3 weeks to talk about your headache -You look goo today!  Treatment Goals:  Goals (1 Years of Data) as of 06/27/12         06/13/12     Diet    . Have 3 meals a day       Result Component    . HEMOGLOBIN A1C < 7.0  9.0      Progress Toward Treatment Goals:  Treatment Goal 06/27/2012  Hemoglobin A1C unchanged  Blood pressure at goal    Self Care Goals & Plans:  Self Care Goal 06/27/2012  Manage my medications bring my medications to every visit; refill my medications on time; take my medicines as prescribed  Monitor my health keep track of my blood glucose; bring my glucose meter and log to each visit  Eat healthy foods eat more vegetables; eat fruit for snacks and desserts  Be physically active -    Home Blood Glucose Monitoring 06/27/2012  Check my blood sugar 2 times a day  When to check my blood sugar before breakfast; before dinner     Care Management & Community Referrals:  Referral 06/27/2012  Referrals made for care management support diabetes educator  Referrals made to community resources nutrition

## 2012-06-28 ENCOUNTER — Encounter: Payer: Self-pay | Admitting: Licensed Clinical Social Worker

## 2012-06-28 NOTE — Progress Notes (Signed)
Ms. Berhe was referred to CSW by Diabetes Educator.  During pt's appt with Diabetes Educator, pt mentioned concerns regarding depression.  CSW was able to meet with Ms. Gaillard following her scheduled appt with D. Plyler.  CSW had lengthy discussion with Ms. Kook. Pt is married and has two adult children.  Pt is currently unemployed and soon will be working at a [employer] and has applied for a job at [employer].  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  Pt states during the time she was to get certified she had multiple deaths in her family.  Pt's main concern comes from the grieving of her mother that passed away in 10-11-2007.  Pt was the primary caregiver of her mother, who needed a lot of hands on care due to her diabetes and being a double amputee.  During the last 5 months of mothers life, pt had to place mother in a nursing facility.  Pt voiced conflict and regret over having to place mother in nursing facility.  Pt had promised mother she would provide care for her.  Shortly after placement, pt's mother became ill and was on hospice, passing away in 11-Oct-2007.  Pt has been part of an online grief group but has not received one-one counseling.  In 10-10-2009, pt's brother passed away from pneumonia.  His death was unexpected and quite sudden as it was thought he would be able to recover.  One month following the pt's brother's death, pt's grandmother passed away in [place].  And quickly after grandmother's death, pt's uncle passed away.  Ms. Lipuma also provides care to her father, who lives with another dau.  Ms. Schrom states she takes father grocery shopping and to all medical appointments.   Pt grieving is not supported by her spouse, who does not understand her grieving process.  Ms. Pettaway feels as though spouse wants her to "get over it" and find a job.  Spouse is the financial provider and complains of current financial constraints. Ms.  Lehane also has conflict with her adult son and his current lifestyle choices, but has a good relationship with both of her adult children.  Pt is supported well by her church community and has a friend that she talks with over the phone.  Pt states she has had one suicide attempt, by overdosing on pills. Ms. Tortorelli does not remember the year this occurred and states she regrets the decision and would not think of doing it again.  Pt states she was not referred to any mental health resources at that time and did not have an inpatient stay. Pt states prayer and positive thinking keeps her motivated.  Pt reports seeing "spirits/ghosts" and will often pray and "bind" negative spirits in her home.  CSW referred Ms. Redder to [facility] and Palliative care, as pt is familiar with hospice services and can receive one-one counseling at no cost through their services.  CSW also discussed [facility] and provided Ms. Goral with information to an Youth worker at [employer].  Pt denies add'l needs at this time.  Pt is aware other mental health counseling options are available but will try hospice first.  Ms. Niccoli has CSW contact information and hours.

## 2012-06-29 NOTE — Assessment & Plan Note (Signed)
She has decreased her intake of Topamax to 50mg  daily instead of 50mg  BID with worse HA at night disrupting her sleep and making her feel depressed.  -Started Elavil 25mg  qHS

## 2012-06-29 NOTE — Assessment & Plan Note (Signed)
Resolved

## 2012-06-29 NOTE — Assessment & Plan Note (Addendum)
Well controlled today, without amlodipine. She reports not taking HCTZ everyday, only as needed for diuresis. Pt was offered prescription for Lasix instead of HCTZ but she just filled this medication and wants to "get her money's worth" of her HCTZ before stopping it.

## 2012-06-29 NOTE — Assessment & Plan Note (Signed)
Discussed eventually stopping her glipizide and starting metformin. No refill for glipizide today.  She brings her CBG meter today but it is not downloaded--audio report only. Her morning, before breakfast has been as low as 77 but she denies sweating/tremor, or other signs of hypoglycemia. She has readings in the 200s before dinner.  -Continue Lantus 30 units at bedtime and glipizide for now--pt declined starting metformin at this time, she wants to finish her glipizide first. -Pt to meet with Lorelee New

## 2012-06-29 NOTE — Assessment & Plan Note (Signed)
Likely 2/2 recent albuterol use, just prior to her visit. Pt denies chest palpitations.

## 2012-07-09 ENCOUNTER — Encounter: Payer: PRIVATE HEALTH INSURANCE | Admitting: Internal Medicine

## 2012-07-11 ENCOUNTER — Telehealth: Payer: Self-pay | Admitting: Internal Medicine

## 2012-07-11 ENCOUNTER — Other Ambulatory Visit: Payer: Self-pay | Admitting: *Deleted

## 2012-07-11 DIAGNOSIS — J45909 Unspecified asthma, uncomplicated: Secondary | ICD-10-CM

## 2012-07-11 MED ORDER — OMEPRAZOLE 20 MG PO CPDR: 20.0000 mg | DELAYED_RELEASE_CAPSULE | Freq: Every day | ORAL | Status: DC

## 2012-07-11 MED ORDER — SIMVASTATIN 80 MG PO TABS: 80.0000 mg | ORAL_TABLET | Freq: Every day | ORAL | Status: DC

## 2012-07-11 MED ORDER — MOMETASONE FURO-FORMOTEROL FUM 100-5 MCG/ACT IN AERO: 2.0000 | INHALATION_SPRAY | Freq: Two times a day (BID) | RESPIRATORY_TRACT | Status: DC

## 2012-07-11 NOTE — Telephone Encounter (Signed)
Called to pharmacy 

## 2012-07-11 NOTE — Telephone Encounter (Signed)
Please advise if okay to give samples of dulera 100  The pt was given 2 samples on 1/27  Next ov 10/10/12 Thanks

## 2012-07-12 MED ORDER — MOMETASONE FURO-FORMOTEROL FUM 100-5 MCG/ACT IN AERO: 2.0000 | INHALATION_SPRAY | Freq: Two times a day (BID) | RESPIRATORY_TRACT | Status: DC

## 2012-07-12 NOTE — Telephone Encounter (Signed)
We are unable to supply patient with samples as we do not get enough and need to have them for new starts; pt should have patient assistance in place or she can renew if needed.

## 2012-07-12 NOTE — Telephone Encounter (Signed)
Pt called back about the same. Brooke Floyd

## 2012-07-12 NOTE — Telephone Encounter (Signed)
Spoke with patient and she was a little upset d/t the fact that we are telling her to sign up for Pt Asst. Program for [facility] and she states that she has already signed up for this and never received any meds. She wants to know what is going on with this and why she hasnt heard anything from the company. After looking through scanned documents, she has filled out a form in the past for [employer] but never for [facility].  In the meantime, I have sent a rx to her [facility] for the [facility] 100 x 6 refills.  Patient is going to come to the office today to get the paperwork to fill out.   Will send to [facility] as [employer]

## 2012-07-16 ENCOUNTER — Ambulatory Visit (INDEPENDENT_AMBULATORY_CARE_PROVIDER_SITE_OTHER): Payer: BC Managed Care – PPO | Admitting: Internal Medicine

## 2012-07-16 ENCOUNTER — Encounter: Payer: Self-pay | Admitting: Internal Medicine

## 2012-07-16 VITALS — BP 137/91 | HR 115 | Temp 97.8°F | Wt 135.1 lb

## 2012-07-16 DIAGNOSIS — E785 Hyperlipidemia, unspecified: Secondary | ICD-10-CM

## 2012-07-16 DIAGNOSIS — E119 Type 2 diabetes mellitus without complications: Secondary | ICD-10-CM

## 2012-07-16 DIAGNOSIS — K219 Gastro-esophageal reflux disease without esophagitis: Secondary | ICD-10-CM

## 2012-07-16 DIAGNOSIS — G8929 Other chronic pain: Secondary | ICD-10-CM

## 2012-07-16 DIAGNOSIS — F32A Depression, unspecified: Secondary | ICD-10-CM | POA: Insufficient documentation

## 2012-07-16 DIAGNOSIS — J45909 Unspecified asthma, uncomplicated: Secondary | ICD-10-CM

## 2012-07-16 DIAGNOSIS — F3289 Other specified depressive episodes: Secondary | ICD-10-CM

## 2012-07-16 DIAGNOSIS — R000 Tachycardia, unspecified: Secondary | ICD-10-CM

## 2012-07-16 DIAGNOSIS — F329 Major depressive disorder, single episode, unspecified: Secondary | ICD-10-CM

## 2012-07-16 DIAGNOSIS — I10 Essential (primary) hypertension: Secondary | ICD-10-CM

## 2012-07-16 DIAGNOSIS — R51 Headache: Secondary | ICD-10-CM

## 2012-07-16 DIAGNOSIS — R29818 Other symptoms and signs involving the nervous system: Secondary | ICD-10-CM

## 2012-07-16 MED ORDER — AMITRIPTYLINE HCL 50 MG PO TABS: 50.0000 mg | ORAL_TABLET | Freq: Every day | ORAL | Status: DC

## 2012-07-16 MED ORDER — METFORMIN HCL 500 MG PO TABS: 500.0000 mg | ORAL_TABLET | Freq: Every day | ORAL | Status: DC

## 2012-07-16 NOTE — Assessment & Plan Note (Signed)
Last LDL at 107. She is on Zocor 80 mg daily. She is planning to exercise more and eat more fruits and vegetables which will further lower her cholesterol.

## 2012-07-16 NOTE — Assessment & Plan Note (Signed)
No recent flare ups. She will follow up with Dr. Maple Hudson.

## 2012-07-16 NOTE — Assessment & Plan Note (Signed)
She has labile mood at home with crying spells at times. Per her husband, this has been going on for almost 3 years since her brother and mother died. He reports that her mood is improving with Elavil.  -increase Elavil to 50mg  qHS.  -Pt encouraged to follow up with [facility], declines at this time. Will offer referral to [facility] again during her next visit if depression is not improving.

## 2012-07-16 NOTE — Patient Instructions (Signed)
General Instructions: -Start taking Elavil 50mg  at bedtime for your sleep and depression -Start taking metformin 500mg  with dinner. You may cut this in half and take 250mg  in the morning with food and 250mg  in the evening with food if you have diarrhea.  -Please make an appointment with Norm Parcel in two weeks.  -Follow up with [place] in May.  -Let us know if you have problems with your metformin.  -It was great seeing you again!   Treatment Goals:  Goals (1 Years of Data) as of 07/16/12         As of Today 06/27/12 06/13/12 06/04/12 03/02/12     Blood Pressure    . Blood Pressure < 140/90  137/91 132/80 116/80 120/80 140/78     Diet    . Have 3 meals a day           Result Component    . HEMOGLOBIN A1C < 7.0    9.0      . LDL CALC < 100    107        Progress Toward Treatment Goals:  Treatment Goal 06/27/2012  Hemoglobin A1C unchanged  Blood pressure at goal    Self Care Goals & Plans:  Self Care Goal 07/16/2012  Manage my medications take my medicines as prescribed; refill my medications on time  Monitor my health keep track of my blood glucose; check my feet daily  Eat healthy foods eat foods that are low in salt; eat baked foods instead of fried foods; drink diet soda or water instead of juice or soda  Be physically active find an activity I enjoy    Home Blood Glucose Monitoring 06/27/2012  Check my blood sugar 2 times a day  When to check my blood sugar before breakfast; before dinner     Care Management & Community Referrals:  Referral 06/27/2012  Referrals made for care management support diabetes educator  Referrals made to community resources nutrition

## 2012-07-16 NOTE — Assessment & Plan Note (Signed)
She reports chronic bilateral leg and back pain. She was advised to not take this more than twice per day while also taking Elavil. She is a little hesitant to come off this medication but eventually should be weaned off this prescription.

## 2012-07-16 NOTE — Assessment & Plan Note (Signed)
Well controlled on Prilosec. She may need this medication changed depending on the monthly cost with her new medical insurance.

## 2012-07-16 NOTE — Assessment & Plan Note (Signed)
BP at goal today even though she has been out of Accupril for one week. Counseled her on importance of refilling her medications and ACEi renal-protective effect.

## 2012-07-16 NOTE — Assessment & Plan Note (Signed)
Lab Results  Component Value Date   HGBA1C 9.0 06/13/2012   CREATININE 0.72 06/13/2012   MICROALBUR 1.26 06/13/2012   MICRALBCREAT 12.1 06/13/2012   CHOL 176 06/13/2012   HDL 51 06/13/2012   TRIG 89 05/14/1094    Last eye exam and foot exam: No results found for this basename: HMDIABEYEEXA, HMDIABFOOTEX    Assessment: Diabetes control: not controlled Progress toward goals: unable to assess Barriers to meeting goals: financial need and nonadherence to medications  Plan: Diabetes treatment: Due to the cost of Lantus, we will discontinue Lantus at this time. She stopped metformin as she had severe diarrhea with it in the past. Will start metformin 500mg  daily, which can even be split to 250mg  BID with foods. Discussed plan with Norm Parcel  Refer to: diabetes educator for self-management training and diabetes educator for medical nutrition therapy Instruction/counseling given: reminded to get eye exam, reminded to bring medications to each visit and discussed diet. She was instructed to follow up with [facility] in two weeks for monitoring of her metformin therapy.

## 2012-07-16 NOTE — Progress Notes (Signed)
  Subjective:    Patient ID: Brooke Floyd, female    DOB: 1965-10-01, 47 y.o.   MRN: [id number]  HPI Brooke Floyd is a 47 year old woman with PMH of uncontrolled DM2, GERD, HTN, chronic headache, and chronic lower back and bilateral leg pains who comes in today accompanied by her husband, for follow up visit for her DM2 and chronic headaches. She reports that she threw away her Topamax. Her headaches and mood are improving. She has started taking Elavil at bedtime with improved sleep. She now has insurance but tells me that her co-pay for Lantus is $200 annually plus $25 per month which she cannot afford. She has not used Lantus in one week. She has also not refilled her Accupril in one week.    Review of Systems  Constitutional: Negative for fever, chills, diaphoresis, activity change, appetite change, fatigue and unexpected weight change.  Respiratory: Negative for cough, chest tightness and shortness of breath.   Cardiovascular: Positive for chest pain. Negative for leg swelling.       She reports having occasional chest pain that improves with rest.   Gastrointestinal: Negative for nausea, vomiting, abdominal pain, diarrhea and abdominal distention.  Genitourinary: Negative for dysuria, urgency, frequency and hematuria.  Musculoskeletal: Positive for back pain.       Chronic  Skin: Negative for rash and wound.  Neurological: Positive for headaches. Negative for dizziness, syncope, weakness and light-headedness.  Psychiatric/Behavioral: Positive for sleep disturbance. Negative for suicidal ideas, confusion, decreased concentration and agitation.       Objective:   Physical Exam  Nursing note and vitals reviewed. Constitutional: She is oriented to person, place, and time. She appears well-developed and well-nourished. No distress.  Eyes: Conjunctivae are normal. No scleral icterus.  Cardiovascular: Normal rate and regular rhythm.   Pulmonary/Chest: Effort normal and breath sounds  normal. No respiratory distress. She has no wheezes. She has no rales. She exhibits no tenderness.  Abdominal: Soft.  Musculoskeletal: She exhibits no edema and no tenderness.  Neurological: She is alert and oriented to person, place, and time.  Skin: Skin is warm and dry. No rash noted. She is not diaphoretic. No erythema. No pallor.  Psychiatric: She has a normal mood and affect. Her behavior is normal.  More interactive, joking at times          Assessment & Plan:

## 2012-07-16 NOTE — Assessment & Plan Note (Signed)
She reports occasional palpitations and chest pains that go away with ASA and rest. She does not know if this is related to anxiety or emotional upset or how often it occurs.  -Will consider EKG during her next visit. She may need exercise stress test (her asthma is well controlled with no flare ups during exercise).

## 2012-07-16 NOTE — Assessment & Plan Note (Signed)
She reports improved headaches without Topamax. She wants to continue taking Elavil qHS. Increased Elavil to 50mg  qHS.

## 2012-07-17 NOTE — Telephone Encounter (Signed)
Per Krista-pt never came by the office as of today to fill out pt assistance forms; I will sign off on message and pt assistance forms are still at front if patient comes by.

## 2012-07-18 ENCOUNTER — Telehealth: Payer: Self-pay | Admitting: Dietician

## 2012-07-18 NOTE — Telephone Encounter (Signed)
I agree. Metformin will be a much better pharmacological therapy for her if she can tolerate it. Thanks! SK

## 2012-07-18 NOTE — Telephone Encounter (Signed)
Concerned Blood sugars high: 200s all day yesterday Took 500 mg metformin two nights ago, took 1/2 500 twice yesterday with food.  Walked for 30 foods, no fried foods No problems tolerating metformin, so took 500 this am and plans to take 500 mg this pm.   Blood sugar 173 this am.  Encouraged patient to continue with exercise, eating healthy and metformin and make follow up appointment and call anytime.

## 2012-07-27 ENCOUNTER — Ambulatory Visit (INDEPENDENT_AMBULATORY_CARE_PROVIDER_SITE_OTHER): Payer: BC Managed Care – PPO | Admitting: Dietician

## 2012-07-27 ENCOUNTER — Other Ambulatory Visit: Payer: Self-pay | Admitting: Internal Medicine

## 2012-07-27 DIAGNOSIS — E119 Type 2 diabetes mellitus without complications: Secondary | ICD-10-CM

## 2012-07-27 MED ORDER — METFORMIN HCL 500 MG PO TABS: 500.0000 mg | ORAL_TABLET | Freq: Three times a day (TID) | ORAL | Status: DC

## 2012-07-27 MED ORDER — INSULIN NPH (HUMAN) (ISOPHANE) 100 UNIT/ML ~~LOC~~ SUSP: 10.0000 [IU] | Freq: Every day | SUBCUTANEOUS | Status: DC

## 2012-07-27 NOTE — Progress Notes (Signed)
Thanks SK   

## 2012-07-27 NOTE — Progress Notes (Signed)
Diabetes Self-Management Education  Visit Number: Follow-up 1  07/27/2012 Ms. Lonzo Candy, identified by name and date of birth, is a 47 y.o. female with  .  Other people present during visit:      ASSESSMENT  Patient Concerns:   high blood sugars  Lab Results: LDL Cholesterol  Date Value Range Status  06/13/2012 107* 0 - 99 mg/dL Final           Hemoglobin A1C  Date Value Range Status  06/13/2012 9.0   Final     Family History  Problem Relation Age of Onset  . Coronary artery disease Father    History  Substance Use Topics  . Smoking status: Never Smoker   . Smokeless tobacco: Not on file  . Alcohol Use: No    Support Systems:   spouse Special Needs:    no Prior DM Education:  yes Daily Foot Exams:  yes Patient Belief / Attitude about Diabetes:   can be controlled  Assessment comments: patient taking metformin 3 pills a day with tolerance. Brought logbook- testing twice daily, avoiding carbs, eating lot's of vegetables,  Wants to restart an affordable insulin. Discussed with Dr. Garald Braver who ordered 10 units Novolin N daily at bedtime with metformin.     Individualized Plan for Diabetes Self-Management Training:  Patient's individualized diabetes plan discussed today with patient and includes:  Nutrition, medications, monitoring,  dealing with diabetes  Education Topics Reviewed with Patient Today:  Topic Points Discussed  Disease State    Nutrition Management    Physical Activity and Exercise    Medications Reviewed patients medication for diabetes, action, purpose, timing of dose and side effects.  Monitoring Taught/evaluated SMBG with one touch ultra mini meter.  Acute Complications Discussed and identified patients' treatment of hyperglycemia.  Chronic Complications    Psychosocial Adjustment    Goal Setting    Preconception Care (if applicable)      PATIENTS GOALS   Learning Objective(s):     Goal The patient agrees to:  Nutrition    Physical  Activity    Medications take my medication as prescribed  Monitoring    Problem Solving    Reducing Risk    Health Coping     Patient Self-Evaluation of Goals (Subsequent Visits)  Goal The patient rates self as meeting goals (% of time)  Nutrition >75%  Physical Activity    Medications    Monitoring    Problem Solving    Reducing Risk    Health Coping       PERSONALIZED PLAN / SUPPORT  Self-Management Support:  Doctor's office;Family;CDE visits  Goal Evaluation:  Most of the time 75% ______________________________________________________________________  Outcomes  Expected Outcomes:  Demonstrated interest in learning. Expect positive outcomes Self-Care Barriers:  Lack of material resources Education material provided: yes If problems or questions, patient to contact team via:  Phone Time in: 1200     Time out: 1230 Future DSME appointment: - 2 wks;4-6 wks   Ariany Kesselman, Lupita Leash 07/27/2012 6:11 PM

## 2012-07-27 NOTE — Patient Instructions (Addendum)
Per Dr.Kennerly okayed restarting [facility] Novolin N insulin 10 units at bedtime.  Continue two metformin (500mg ) with breakfast and one 500mg ) with dinner.  Keep up the great work in recording your blood sugars and eating healthy!

## 2012-08-03 ENCOUNTER — Telehealth: Payer: Self-pay | Admitting: Internal Medicine

## 2012-08-03 NOTE — Telephone Encounter (Signed)
ATC PT NA unable to leave VM bc it has not been set up yet [facility]

## 2012-08-06 NOTE — Telephone Encounter (Signed)
I spoke with the pt and she is asking if Dr. Maple Hudson will complete patient assistance forms for her insulin. She states she spoke with her PCP at [facility] and they advised her that they cannot receive medications there for a patient and that she will have to have meds shipped to her home. Pt states the company will not ship to her home, only to a physicians office.  I called [facility] to check on this protocol and I spoke with Lupita Leash, diabetes coordinator, and she states that they work with [employer] for patient assistance and that meds are shipped there, they do not accept any meds in their office. This pt does not qualify for assistance through the health dept because she has insurance. They pt is trying to get assistance through an outside resource. Pt wants to know if Dr. Maple Hudson would be willing to complete paperwork for assistance for the pt insulin and have the med shipped here? Please advise. Carron Curie, CMA

## 2012-08-06 NOTE — Telephone Encounter (Signed)
i spoke with pt and made her aware. Nothing further was needed

## 2012-08-06 NOTE — Telephone Encounter (Signed)
ATC patient, no answer [id number] 

## 2012-08-06 NOTE — Telephone Encounter (Signed)
Per CY-we do not Rx her insulin therefore we can not assume responsibility for her insulin. Sorry.

## 2012-08-08 ENCOUNTER — Ambulatory Visit: Payer: BC Managed Care – PPO | Admitting: Internal Medicine

## 2012-08-15 ENCOUNTER — Other Ambulatory Visit: Payer: Self-pay | Admitting: *Deleted

## 2012-08-15 DIAGNOSIS — I10 Essential (primary) hypertension: Secondary | ICD-10-CM

## 2012-08-15 MED ORDER — QUINAPRIL HCL 20 MG PO TABS: 40.0000 mg | ORAL_TABLET | Freq: Every day | ORAL | Status: DC

## 2012-08-15 NOTE — Telephone Encounter (Signed)
Rx called in to pharmacy. 

## 2012-08-16 ENCOUNTER — Telehealth: Payer: Self-pay | Admitting: *Deleted

## 2012-08-16 NOTE — Telephone Encounter (Signed)
Fax from [facility] - pt has been taking 40mg  BID of Quinapril; last refill was 20mg  2 tabs qd. Has dose been decreased? Thanks

## 2012-08-16 NOTE — Telephone Encounter (Signed)
Pt and [facility] informed of Quinapril 20mg  take 2 tabs daily as ordered.

## 2012-08-16 NOTE — Telephone Encounter (Signed)
Per our records it has always been 20mg , 2 tablets daily. Please advise the patient to take her medicine as prescribed. Her BP was 137/91 close to goal of <140/90 during her last visit while off this medication so 40mg  BID may be too much. Thanks! SK

## 2012-08-27 ENCOUNTER — Ambulatory Visit: Payer: BC Managed Care – PPO | Admitting: Dietician

## 2012-08-28 ENCOUNTER — Encounter: Payer: Self-pay | Admitting: Internal Medicine

## 2012-09-04 ENCOUNTER — Telehealth: Payer: Self-pay | Admitting: Dietician

## 2012-09-07 NOTE — Telephone Encounter (Signed)
Had left patient a message that her chart shows an order for NPH insulin ,  not lantus.  Await return call from patient.

## 2012-09-11 ENCOUNTER — Other Ambulatory Visit: Payer: Self-pay | Admitting: *Deleted

## 2012-09-11 DIAGNOSIS — M549 Dorsalgia, unspecified: Secondary | ICD-10-CM

## 2012-09-11 MED ORDER — CYCLOBENZAPRINE HCL 10 MG PO TABS: 10.0000 mg | ORAL_TABLET | Freq: Two times a day (BID) | ORAL | Status: DC | PRN

## 2012-09-17 ENCOUNTER — Encounter: Payer: BC Managed Care – PPO | Admitting: Internal Medicine

## 2012-10-11 ENCOUNTER — Ambulatory Visit: Payer: Self-pay | Admitting: Internal Medicine

## 2012-10-16 ENCOUNTER — Encounter: Payer: Self-pay | Admitting: Dietician

## 2012-11-05 ENCOUNTER — Ambulatory Visit: Payer: BC Managed Care – PPO | Admitting: Dietician

## 2012-11-05 ENCOUNTER — Encounter: Payer: BC Managed Care – PPO | Admitting: Internal Medicine

## 2012-11-08 ENCOUNTER — Other Ambulatory Visit: Payer: Self-pay | Admitting: *Deleted

## 2012-11-08 ENCOUNTER — Ambulatory Visit (INDEPENDENT_AMBULATORY_CARE_PROVIDER_SITE_OTHER): Payer: BC Managed Care – PPO | Admitting: Internal Medicine

## 2012-11-08 VITALS — BP 115/74 | HR 99 | Temp 97.5°F | Ht 62.0 in | Wt 124.7 lb

## 2012-11-08 DIAGNOSIS — F3289 Other specified depressive episodes: Secondary | ICD-10-CM

## 2012-11-08 DIAGNOSIS — F329 Major depressive disorder, single episode, unspecified: Secondary | ICD-10-CM

## 2012-11-08 DIAGNOSIS — E119 Type 2 diabetes mellitus without complications: Secondary | ICD-10-CM

## 2012-11-08 DIAGNOSIS — F32A Depression, unspecified: Secondary | ICD-10-CM

## 2012-11-08 DIAGNOSIS — F339 Major depressive disorder, recurrent, unspecified: Secondary | ICD-10-CM

## 2012-11-08 DIAGNOSIS — G8929 Other chronic pain: Secondary | ICD-10-CM

## 2012-11-08 LAB — POCT GLYCOSYLATED HEMOGLOBIN (HGB A1C): Hemoglobin A1C: 10.5

## 2012-11-08 MED ORDER — INSULIN GLARGINE 100 UNIT/ML ~~LOC~~ SOLN: SUBCUTANEOUS | Status: DC

## 2012-11-08 MED ORDER — INSULIN SYRINGES (DISPOSABLE) U-100 0.5 ML MISC: Status: DC

## 2012-11-08 MED ORDER — CITALOPRAM HYDROBROMIDE 10 MG PO TABS: 10.0000 mg | ORAL_TABLET | Freq: Every day | ORAL | Status: DC

## 2012-11-08 NOTE — Assessment & Plan Note (Addendum)
Assessment:  The patient is likely to have recurrent depression refractory to treatment with TCA.  Plan:  I recommend that we switch patient to a SSRI, citalopram, and referred the patient to meet with social worker to explore options for counseling. Follow-up with PCP as needed.

## 2012-11-08 NOTE — Assessment & Plan Note (Signed)
Lab Results  Component Value Date   HGBA1C 10.5 11/08/2012   HGBA1C 9.0 06/13/2012     Assessment: Diabetes control: poor control (HgbA1C >9%) Progress toward A1C goal:  deteriorated Comments: The patient has not been able to procure her insulin for the last month. Further, she states that it was not working for her  Plan: Medications:  Switch to lantus 20 units daily, provided 1.5 month sample while the patient arranges for future insulin to be sent to cousins business. Home glucose monitoring: Frequency: once a day Timing: before breakfast Instruction/counseling given: reminded to bring medications to each visit and discussed diet Educational resources provided: brochure Self management tools provided: home glucose logbook Other plans: Plan to have patient follow up with diabtes education, already has appt for July 28th.

## 2012-11-08 NOTE — Progress Notes (Signed)
Subjective:   Patient ID: Brooke Floyd female   DOB: [birth year] 47 y.o.   MRN: [id number]  HPI: Brooke Floyd is a 47 y.o. with a history of DM II, HTN, and depression who presents to clinic with a chief complaint of trouble sleeping. The patient states that she has had increased trouble sleeping for the last few weeks. Further, while her depression initially improved with Elavil, she now notes that she has been feeling increasingly depressed. She admits to sleep disturbance, weight loss, anhedonia, and inattention. Also, she admits suicidal ideation in the form that she sometimes feels like she does not want to continue to live.  The patient admits to a episode of suicide attempt 2 years ago. The patient denies having a plan to commit suicide.  Also, the patient states that she will not hurt herself. The patient denies HI.  She has struggled with depression since her mother passed away in 10-Oct-2009. She has tried Elavil for the past 5 months.   In terms of the patient's diabetes management, the patient has been non-compliant with taking her insulin. She states that she had been unable to acquire it for the last month due to financial problems. The patient plans to obtain her insulin by having it mailed to her cousins place of business.     Past Medical History  Diagnosis Date  . Unspecified asthma, with exacerbation   . Other symptoms involving nervous and musculoskeletal systems   . Type II or unspecified type diabetes mellitus without mention of complication, not stated as uncontrolled     takes insulin  . GERD (gastroesophageal reflux disease)   . Hypertension    Current Outpatient Prescriptions  Medication Sig Dispense Refill  . albuterol (PROAIR HFA) 108 (90 BASE) MCG/ACT inhaler Inhale 2 puffs into the lungs every 6 (six) hours as needed for wheezing or shortness of breath. Shortness of breath  1 Inhaler  prn  . aspirin 81 MG tablet Take 81 mg by mouth daily.        .  cyclobenzaprine (FLEXERIL) 10 MG tablet Take 1 tablet (10 mg total) by mouth 2 (two) times daily as needed for muscle spasms.  30 tablet  2  . hydrochlorothiazide (HYDRODIURIL) 12.5 MG tablet Take 1 tablet (12.5 mg total) by mouth daily.  30 tablet  2  . metFORMIN (GLUCOPHAGE) 500 MG tablet Take 1 tablet (500 mg total) by mouth 3 (three) times daily with meals.  90 tablet  11  . montelukast (SINGULAIR) 10 MG tablet Take 1 tablet (10 mg total) by mouth at bedtime.  30 tablet  prn  . Multiple Vitamin (MULTIVITAMIN) capsule Take 2 capsules by mouth daily.       Marland Kitchen omeprazole (PRILOSEC) 20 MG capsule Take 1 capsule (20 mg total) by mouth daily.  30 capsule  11  . quinapril (ACCUPRIL) 20 MG tablet Take 2 tablets (40 mg total) by mouth daily.  60 tablet  2  . simvastatin (ZOCOR) 80 MG tablet Take 1 tablet (80 mg total) by mouth at bedtime.  30 tablet  11   No current facility-administered medications for this visit.   Family History  Problem Relation Age of Onset  . Coronary artery disease Father    History   Social History  . Marital Status: Married    Spouse Name: N/A    Number of Children: 2  . Years of Education: N/A   Occupational History  . CNA    Social History Main  Topics  . Smoking status: Never Smoker   . Smokeless tobacco: Not on file  . Alcohol Use: No  . Drug Use: Not on file  . Sexually Active: Not on file   Other Topics Concern  . Not on file   Social History Narrative  . No narrative on file   Review of Systems: Review of Systems  Constitutional: Positive for malaise/fatigue. Negative for fever, chills and weight loss.  Eyes: Negative.   Respiratory: Negative for cough, hemoptysis, sputum production, shortness of breath and wheezing.   Cardiovascular: Negative for chest pain, palpitations, orthopnea and leg swelling.  Gastrointestinal: Negative for heartburn, nausea and vomiting.  Genitourinary: Negative for dysuria, urgency and frequency.  Musculoskeletal:  Negative.   Skin: Negative for itching and rash.  Neurological: Positive for headaches. Negative for dizziness, tingling, tremors, sensory change, speech change and weakness.  Psychiatric/Behavioral: Positive for depression and suicidal ideas. Negative for hallucinations, memory loss and substance abuse. The patient has insomnia. The patient is not nervous/anxious.      Objective:  Physical Exam: Filed Vitals:   11/08/12 1000  BP: 115/74  Pulse: 99  Temp: 97.5 F (36.4 C)  TempSrc: Oral  Height: 5\' 2"  (1.575 m)  Weight: 124 lb 11.2 oz (56.564 kg)  SpO2: 99%   Physical Exam  Constitutional: She is oriented to person, place, and time. She appears well-developed and well-nourished. No distress.  HENT:  Head: Normocephalic and atraumatic.  Nose: Nose normal.  Eyes: Conjunctivae and EOM are normal. Pupils are equal, round, and reactive to light. No scleral icterus.  Neck: Normal range of motion.  Cardiovascular: Normal rate, regular rhythm, normal heart sounds and intact distal pulses.  Exam reveals no gallop and no friction rub.   No murmur heard. Pulmonary/Chest: Effort normal and breath sounds normal. No respiratory distress. She has no wheezes. She has no rales. She exhibits no tenderness.  Abdominal: Soft. Bowel sounds are normal.  Musculoskeletal: Normal range of motion.  Neurological: She is alert and oriented to person, place, and time.  Skin: She is not diaphoretic.  Psychiatric:  The patient has a depressed mood with flat affect. The Patient has difficulty keeping her eyes open or maintaining eye contact.     Assessment & Plan:

## 2012-11-08 NOTE — Progress Notes (Signed)
I saw and evaluated the patient.  I personally confirmed the key portions of the history and exam documented by Dr. Komanski and I reviewed pertinent patient test results.  The assessment, diagnosis, and plan were formulated together and I agree with the documentation in the resident's note.  

## 2012-11-08 NOTE — Patient Instructions (Addendum)
Restart Lantus at 20 units/day  Start Celexa  Follow up with PCP in 4-6 weeks.  Depression, Adult Depression refers to feeling sad, low, down in the dumps, blue, gloomy, or empty. In general, there are two kinds of depression: 1. Depression that we all experience from time to time because of upsetting life experiences, including the loss of a job or the ending of a relationship (normal sadness or normal grief). This kind of depression is considered normal, is short lived, and resolves within a few days to 2 weeks. (Depression experienced after the loss of a loved one is called bereavement. Bereavement often lasts longer than 2 weeks but normally gets better with time.) 2. Clinical depression, which lasts longer than normal sadness or normal grief or interferes with your ability to function at home, at work, and in school. It also interferes with your personal relationships. It affects almost every aspect of your life. Clinical depression is an illness. Symptoms of depression also can be caused by conditions other than normal sadness and grief or clinical depression. Examples of these conditions are listed as follows:  Physical illness Some physical illnesses, including underactive thyroid gland (hypothyroidism), severe anemia, specific types of cancer, diabetes, uncontrolled seizures, heart and lung problems, strokes, and chronic pain are commonly associated with symptoms of depression.  Side effects of some prescription medicine In some people, certain types of prescription medicine can cause symptoms of depression.  Substance abuse Abuse of alcohol and illicit drugs can cause symptoms of depression. SYMPTOMS Symptoms of normal sadness and normal grief include the following:  Feeling sad or crying for short periods of time.  Not caring about anything (apathy).  Difficulty sleeping or sleeping too much.  No longer able to enjoy the things you used to enjoy.  Desire to be by oneself all  the time (social isolation).  Lack of energy or motivation.  Difficulty concentrating or remembering.  Change in appetite or weight.  Restlessness or agitation. Symptoms of clinical depression include the same symptoms of normal sadness or normal grief and also the following symptoms:  Feeling sad or crying all the time.  Feelings of guilt or worthlessness.  Feelings of hopelessness or helplessness.  Thoughts of suicide or the desire to harm yourself (suicidal ideation).  Loss of touch with reality (psychotic symptoms). Seeing or hearing things that are not real (hallucinations) or having false beliefs about your life or the people around you (delusions and paranoia). DIAGNOSIS  The diagnosis of clinical depression usually is based on the severity and duration of the symptoms. Your caregiver also will ask you questions about your medical history and substance use to find out if physical illness, use of prescription medicine, or substance abuse is causing your depression. Your caregiver also may order blood tests. TREATMENT  Typically, normal sadness and normal grief do not require treatment. However, sometimes antidepressant medicine is prescribed for bereavement to ease the depressive symptoms until they resolve. The treatment for clinical depression depends on the severity of your symptoms but typically includes antidepressant medicine, counseling with a mental health professional, or a combination of both. Your caregiver will help to determine what treatment is best for you. Depression caused by physical illness usually goes away with appropriate medical treatment of the illness. If prescription medicine is causing depression, talk with your caregiver about stopping the medicine, decreasing the dose, or substituting another medicine. Depression caused by abuse of alcohol or illicit drugs abuse goes away with abstinence from these substances. Some adults need  professional help in order to  stop drinking or using drugs. SEEK IMMEDIATE CARE IF:  You have thoughts about hurting yourself or others.  You lose touch with reality (have psychotic symptoms).  You are taking medicine for depression and have a serious side effect. FOR MORE INFORMATION National Alliance on Mental Illness: www.nami.[employer] of [employer]: http://www.maynard.net/ Document Released: 04/22/2000 Document Revised: 10/25/2011 Document Reviewed: 07/25/2011 [employer] Patient Information 2014 [place], [place].

## 2012-11-08 NOTE — Progress Notes (Deleted)
  Subjective:    Patient ID: Brooke Floyd, female    DOB: 03/20/1966, 47 y.o.   MRN: [id number]  Diabetes Hypoglycemia symptoms include headaches. Pertinent negatives for hypoglycemia include no confusion or dizziness. Associated symptoms include chest pain. Pertinent negatives for diabetes include no fatigue and no weakness.   Brooke Floyd is a 47 year old woman with PMH of uncontrolled DM2, GERD, HTN, chronic headache, and chronic lower back and bilateral leg pains who comes in today accompanied by her husband, for follow up visit for her DM2 and chronic headaches. She reports that she threw away her Topamax. Her headaches and mood are improving. She has started taking Elavil at bedtime with improved sleep. She now has insurance but tells me that her co-pay for Lantus is $200 annually plus $25 per month which she cannot afford. She has not used Lantus in one week. She has also not refilled her Accupril in one week.    Review of Systems  Constitutional: Negative for fever, chills, diaphoresis, activity change, appetite change, fatigue and unexpected weight change.  Respiratory: Negative for cough, chest tightness and shortness of breath.   Cardiovascular: Positive for chest pain. Negative for leg swelling.       She reports having occasional chest pain that improves with rest.   Gastrointestinal: Negative for nausea, vomiting, abdominal pain, diarrhea and abdominal distention.  Genitourinary: Negative for dysuria, urgency, frequency and hematuria.  Musculoskeletal: Positive for back pain.       Chronic  Skin: Negative for rash and wound.  Neurological: Positive for headaches. Negative for dizziness, syncope, weakness and light-headedness.  Psychiatric/Behavioral: Positive for sleep disturbance. Negative for suicidal ideas, confusion, decreased concentration and agitation.       Objective:   Physical Exam  Nursing note and vitals reviewed. Constitutional: She is oriented to person,  place, and time. She appears well-developed and well-nourished. No distress.  Eyes: Conjunctivae are normal. No scleral icterus.  Cardiovascular: Normal rate and regular rhythm.   Pulmonary/Chest: Effort normal and breath sounds normal. No respiratory distress. She has no wheezes. She has no rales. She exhibits no tenderness.  Abdominal: Soft.  Musculoskeletal: She exhibits no edema and no tenderness.  Neurological: She is alert and oriented to person, place, and time.  Skin: Skin is warm and dry. No rash noted. She is not diaphoretic. No erythema. No pallor.  Psychiatric: She has a normal mood and affect. Her behavior is normal.  More interactive, joking at times          Assessment & Plan:

## 2012-11-12 MED ORDER — AMITRIPTYLINE HCL 50 MG PO TABS: 50.0000 mg | ORAL_TABLET | Freq: Every day | ORAL | Status: DC

## 2012-11-14 ENCOUNTER — Telehealth: Payer: Self-pay | Admitting: Licensed Clinical Social Worker

## 2012-11-15 ENCOUNTER — Other Ambulatory Visit: Payer: Self-pay

## 2012-11-15 NOTE — Telephone Encounter (Signed)
Attempted to contact patient by phone.  Both number provided are not in service for incoming calls.  Letter mailed.

## 2012-11-21 ENCOUNTER — Other Ambulatory Visit: Payer: Self-pay | Admitting: Internal Medicine

## 2012-12-03 ENCOUNTER — Encounter: Payer: BC Managed Care – PPO | Admitting: Internal Medicine

## 2012-12-07 ENCOUNTER — Ambulatory Visit: Payer: Self-pay | Admitting: Internal Medicine

## 2012-12-14 ENCOUNTER — Other Ambulatory Visit: Payer: Self-pay | Admitting: *Deleted

## 2012-12-14 DIAGNOSIS — I10 Essential (primary) hypertension: Secondary | ICD-10-CM

## 2012-12-15 MED ORDER — HYDROCHLOROTHIAZIDE 12.5 MG PO TABS: 12.5000 mg | ORAL_TABLET | Freq: Every day | ORAL | Status: DC

## 2012-12-17 NOTE — Telephone Encounter (Signed)
Rx faxed in.

## 2012-12-19 ENCOUNTER — Ambulatory Visit (INDEPENDENT_AMBULATORY_CARE_PROVIDER_SITE_OTHER): Payer: BC Managed Care – PPO | Admitting: Internal Medicine

## 2012-12-19 ENCOUNTER — Encounter: Payer: Self-pay | Admitting: Internal Medicine

## 2012-12-19 VITALS — BP 115/72 | HR 93 | Temp 97.6°F | Ht 62.0 in | Wt 124.9 lb

## 2012-12-19 DIAGNOSIS — F329 Major depressive disorder, single episode, unspecified: Secondary | ICD-10-CM

## 2012-12-19 DIAGNOSIS — F3289 Other specified depressive episodes: Secondary | ICD-10-CM

## 2012-12-19 DIAGNOSIS — E119 Type 2 diabetes mellitus without complications: Secondary | ICD-10-CM

## 2012-12-19 DIAGNOSIS — F32A Depression, unspecified: Secondary | ICD-10-CM

## 2012-12-19 DIAGNOSIS — D649 Anemia, unspecified: Secondary | ICD-10-CM

## 2012-12-19 DIAGNOSIS — R29818 Other symptoms and signs involving the nervous system: Secondary | ICD-10-CM

## 2012-12-19 DIAGNOSIS — I10 Essential (primary) hypertension: Secondary | ICD-10-CM

## 2012-12-19 DIAGNOSIS — E785 Hyperlipidemia, unspecified: Secondary | ICD-10-CM

## 2012-12-19 DIAGNOSIS — G4700 Insomnia, unspecified: Secondary | ICD-10-CM

## 2012-12-19 LAB — CBC
Hemoglobin: 12.6 g/dL (ref 12.0–15.0)
MCHC: 32.6 g/dL (ref 30.0–36.0)
Platelets: 495 10*3/uL — ABNORMAL HIGH (ref 150–400)
RDW: 12.2 % (ref 11.5–15.5)

## 2012-12-19 LAB — GLUCOSE, CAPILLARY: Glucose-Capillary: 233 mg/dL — ABNORMAL HIGH (ref 70–99)

## 2012-12-19 MED ORDER — METFORMIN HCL 500 MG PO TABS: 1000.0000 mg | ORAL_TABLET | Freq: Two times a day (BID) | ORAL | Status: DC

## 2012-12-19 NOTE — Patient Instructions (Addendum)
General Instructions: -Bring your paperwork to the front desk for Lantus discount.  -Stop taking hydrochlorothiazide.  -Check your blood sugar at least two times per day and bring this information with you, along with your meter during your next visit.  -Follow up with [place] in two weeks for your diabetes and for your blood pressure.     Treatment Goals:  Goals (1 Years of Data) as of 12/19/12         As of Today 11/08/12 07/16/12 06/27/12 06/13/12     Blood Pressure    . Blood Pressure < 140/90  115/72 115/74 137/91 132/80 116/80     Diet    . Have 3 meals a day   No        Result Component    . HEMOGLOBIN A1C < 7.0   10.5   9.0    . LDL CALC < 100      107      Progress Toward Treatment Goals:  Treatment Goal 12/19/2012  Hemoglobin A1C deteriorated  Blood pressure at goal    Self Care Goals & Plans:  Self Care Goal 12/19/2012  Manage my medications take my medicines as prescribed; bring my medications to every visit  Monitor my health keep track of my blood glucose  Eat healthy foods eat baked foods instead of fried foods  Be physically active find an activity I enjoy  Meeting treatment goals maintain the current self-care plan    Home Blood Glucose Monitoring 12/19/2012  Check my blood sugar no home glucose monitoring  When to check my blood sugar -     Care Management & Community Referrals:  Referral 12/19/2012  Referrals made for care management support none needed  Referrals made to community resources none

## 2012-12-20 DIAGNOSIS — G4700 Insomnia, unspecified: Secondary | ICD-10-CM | POA: Insufficient documentation

## 2012-12-20 NOTE — Assessment & Plan Note (Addendum)
Pt has lost weight and is exercising daily and eating a healthier diet. Her LDL should improve, will check in 5-6 months. Continue statin for now.

## 2012-12-20 NOTE — Assessment & Plan Note (Addendum)
Lab Results  Component Value Date   HGBA1C 10.5 11/08/2012   HGBA1C 9.0 06/13/2012     Assessment: Diabetes control: poor control (HgbA1C >9%) Progress toward A1C goal:  deteriorated Comments: Pt has struggled to pay for her insulin. She was given sample of Lantus during her last visit and has ran out of insulin for almost 1 week. She has lost weight and is exercising for at least 30 minutes daily. Her diet has changed as well with smaller portions and more vegetables. I will give her a sample of Lantus during this visit but she will drop off forms for discount for Lantus. She does have prescription for Lantus if she chooses to purchase this prior to her next visit.   Plan: Medications:  Will increased metformin to 1000mg  BID (from 500mg  TID), Continue Lantus 10 units daily.  Home glucose monitoring: Frequency: no home glucose monitoring Timing:   Instruction/counseling given: reminded to get eye exam, reminded to bring blood glucose meter & log to each visit, reminded to bring medications to each visit and discussed foot care Educational resources provided:   Self management tools provided: home glucose logbook Other plans: Pt has CBG meter and strips, instructed to check CBG at least twice daily, before breakfast and at bedtime. Pt to follow up in 2 weeks. Referred her for eye exam.

## 2012-12-20 NOTE — Assessment & Plan Note (Signed)
She continues to take flexeril as needed. Her leg pain is described as cramping which could be secondary to anemia. She is pale on physical exam and has hx of iron deficiency anemia. Will check CBC and anemia panel if low Hgb.

## 2012-12-20 NOTE — Assessment & Plan Note (Signed)
BP Readings from Last 3 Encounters:  12/19/12 115/72  11/08/12 115/74  07/16/12 137/91    Lab Results  Component Value Date   NA 135 06/13/2012   K 4.2 06/13/2012   CREATININE 0.72 06/13/2012    Assessment: Blood pressure control: controlled Progress toward BP goal:  at goal Comments: She is on HCTZ 12.5 mg daily, and Accupril 20mg  daily.   Plan: Medications:  will discontinue HCTZ, continue ACEi Educational resources provided:   Self management tools provided:   Other plans: Pt to follow up in two weeks.

## 2012-12-20 NOTE — Assessment & Plan Note (Signed)
Pt states that her depression is much improved. She does not want medications at this time.

## 2012-12-20 NOTE — Assessment & Plan Note (Signed)
Pt with difficulty staying asleep due to her and her husband's work/shift changes. Pt advised to take melatonin or Tylenol PM for insomnia.

## 2012-12-20 NOTE — Progress Notes (Signed)
  Subjective:    Patient ID: Brooke Floyd, female    DOB: [birth year], 47 y.o.   MRN: [id number]  HPI Brooke Floyd is a 47 year old woman with PMH significant for DM2, asthma, depression, and HTN who presents for routine follow up visit. She states that her depression has greatly improved since she started exercising regularly. She had low back and increased libido with Celexa and decided to discontinue this medication on her own two weeks ago. Her insomnia is persistent but she explains that she gets off work at 11:30PM, her husband works third shift and there is usually motion/noise disturbances while she is trying to sleep and her husband comes home.  She has been unable to get the paperwork for the Lantus discount but states that she will be able to drop this application off at our office sometime next week. She has run out of the Lantus sample, and has not used it one week.     Review of Systems  Constitutional: Negative for fever, chills, diaphoresis, activity change, appetite change, fatigue and unexpected weight change.  Respiratory: Positive for shortness of breath. Negative for cough and chest tightness.   Cardiovascular: Negative for chest pain, palpitations and leg swelling.  Gastrointestinal: Negative for abdominal pain, diarrhea, constipation and abdominal distention.  Genitourinary: Negative for dysuria, frequency, vaginal bleeding and vaginal discharge.  Musculoskeletal:       Chronic bilateral leg pain  Skin: Negative for color change, pallor, rash and wound.  Neurological: Negative for dizziness, tremors, light-headedness and headaches.  Hematological: Negative for adenopathy.  Psychiatric/Behavioral: Positive for sleep disturbance.       Objective:   Physical Exam  Nursing note and vitals reviewed. Constitutional: She is oriented to person, place, and time. She appears well-developed and well-nourished. No distress.  More animated than in previous visit  HENT:  Head:  Normocephalic and atraumatic.  Eyes: Conjunctivae are normal. No scleral icterus.  Cardiovascular: Normal rate and regular rhythm.   Pulmonary/Chest: Effort normal and breath sounds normal. No respiratory distress. She has no wheezes. She has no rales.  Abdominal: Soft. Bowel sounds are normal.  Musculoskeletal: Normal range of motion. She exhibits no edema and no tenderness.  Neurological: She is alert and oriented to person, place, and time.  Skin: Skin is warm and dry. No rash noted. She is not diaphoretic. No erythema. There is pallor.  Facial pallor   Psychiatric: She has a normal mood and affect. Her behavior is normal.          Assessment & Plan:

## 2012-12-21 NOTE — Progress Notes (Signed)
Case discussed with Dr. Kennerly soon after the resident saw the patient.  We reviewed the resident's history and exam and pertinent patient test results.  I agree with the assessment, diagnosis, and plan of care documented in the resident's note. 

## 2013-01-02 ENCOUNTER — Ambulatory Visit (INDEPENDENT_AMBULATORY_CARE_PROVIDER_SITE_OTHER): Payer: BC Managed Care – PPO | Admitting: Internal Medicine

## 2013-01-02 ENCOUNTER — Encounter: Payer: Self-pay | Admitting: Internal Medicine

## 2013-01-02 VITALS — BP 122/81 | HR 80 | Temp 97.4°F | Ht 62.0 in | Wt 128.0 lb

## 2013-01-02 DIAGNOSIS — G4700 Insomnia, unspecified: Secondary | ICD-10-CM

## 2013-01-02 DIAGNOSIS — I10 Essential (primary) hypertension: Secondary | ICD-10-CM

## 2013-01-02 DIAGNOSIS — F329 Major depressive disorder, single episode, unspecified: Secondary | ICD-10-CM

## 2013-01-02 DIAGNOSIS — F3289 Other specified depressive episodes: Secondary | ICD-10-CM

## 2013-01-02 DIAGNOSIS — E119 Type 2 diabetes mellitus without complications: Secondary | ICD-10-CM

## 2013-01-02 DIAGNOSIS — F32A Depression, unspecified: Secondary | ICD-10-CM

## 2013-01-02 DIAGNOSIS — Z23 Encounter for immunization: Secondary | ICD-10-CM

## 2013-01-02 DIAGNOSIS — Z0000 Encounter for general adult medical examination without abnormal findings: Secondary | ICD-10-CM | POA: Insufficient documentation

## 2013-01-02 LAB — GLUCOSE, CAPILLARY: Glucose-Capillary: 99 mg/dL (ref 70–99)

## 2013-01-02 MED ORDER — MELATONIN 3 MG PO CAPS: 3.0000 mg | ORAL_CAPSULE | Freq: Every evening | ORAL | Status: DC | PRN

## 2013-01-02 MED ORDER — GLIPIZIDE 5 MG PO TABS: 5.0000 mg | ORAL_TABLET | Freq: Two times a day (BID) | ORAL | Status: DC

## 2013-01-02 MED ORDER — INSULIN GLARGINE 100 UNIT/ML ~~LOC~~ SOLN: SUBCUTANEOUS | Status: DC

## 2013-01-02 MED ORDER — METFORMIN HCL 1000 MG PO TABS: 1000.0000 mg | ORAL_TABLET | Freq: Two times a day (BID) | ORAL | Status: DC

## 2013-01-02 NOTE — Assessment & Plan Note (Signed)
Stable, she does not feel like she needs medications at this time.

## 2013-01-02 NOTE — Progress Notes (Signed)
  Subjective:    Patient ID: Brooke Floyd, female    DOB: [birth year], 47 y.o.   MRN: [id number]  HPI Brooke Floyd is a 47 year old woman with PMH significant for DM2, HTN, depression, and insomnia who comes in, accompanied by her husband, for follow up visit for her DM2 and HTN. She has been checking her CBG before breakfast and before bed. Her CBG meter is not downloadable but she has been witting down the CBG values. Her insurance requires a $200 co-pay for Lantus, per month and she is not able to afford this as she is currently out of work.  She has not tried melatonin yet and is tired today, did not sleep well last night.      Review of Systems  Constitutional: Negative for fever, chills, diaphoresis, activity change, appetite change, fatigue and unexpected weight change.  Respiratory: Negative for cough, shortness of breath and wheezing.   Cardiovascular: Negative for chest pain, palpitations and leg swelling.  Gastrointestinal: Negative for abdominal pain.  Neurological: Negative for dizziness, light-headedness and headaches.       Objective:   Physical Exam  Nursing note and vitals reviewed. Constitutional: She is oriented to person, place, and time. She appears well-developed and well-nourished. No distress.  Tired appearing  Eyes: Conjunctivae are normal. No scleral icterus.  Cardiovascular: Normal rate and regular rhythm.   Pulmonary/Chest: Effort normal. No respiratory distress.  Musculoskeletal: She exhibits no edema.  Neurological: She is alert and oriented to person, place, and time.  Skin: Skin is warm and dry. She is not diaphoretic.  Psychiatric: She has a normal mood and affect. Her behavior is normal.          Assessment & Plan:

## 2013-01-02 NOTE — Assessment & Plan Note (Signed)
Flu vaccine and pneumovax given today. She is willing to have Tdap vaccine during her next visit.

## 2013-01-02 NOTE — Assessment & Plan Note (Signed)
Pt has tried Tylenol PM with not much improvement. She will try melatonin 3-6mg  caps qHS. If this supplement does not work, she will try Benadryl 25mg  qHS.

## 2013-01-02 NOTE — Assessment & Plan Note (Signed)
BP Readings from Last 3 Encounters:  01/02/13 122/81  12/19/12 115/72  11/08/12 115/74    Lab Results  Component Value Date   NA 135 06/13/2012   K 4.2 06/13/2012   CREATININE 0.72 06/13/2012    Assessment: Blood pressure control:  At goal Progress toward BP goal:   IMproved Comments: She is off HCTZ which was discontinued during her last visit. Continues to take Accupril 20mg  daily.   Plan: Medications:  continue current medications Educational resources provided:   Self management tools provided:   Other plans: Follow up in 1 month.

## 2013-01-02 NOTE — Assessment & Plan Note (Addendum)
Lab Results  Component Value Date   HGBA1C 10.5 11/08/2012   HGBA1C 9.0 06/13/2012     Assessment: Diabetes control:  Not controlled Progress toward A1C goal:   Deteriorated  Comments: Pt is on metformin 1000mg  BID and Lantus which was prescribed as 10 units qHS but she has increased it to 30 units qHS with before breakfast CBGs in the 130-200s. She tried Lantus 40 units HS once and had CBG of 77 the next morning, she denies hypoglycemia at other occasions. She states that she gets dizzy when she has hypoglycemia and knows to eat a piece of candy or sweets with hypoglycemia. She again is resistant to Novolog with meals, or SSI. She only eats "brunch" and dinner daily so 70/30 insulin would not be optimal for her. She has been on glipizide BID ac before while on Lantus with no recurrent hypoglycemia. Of note, she will run out of Lantus next week and her application for assistance with insulin cannot be completed as the [facility] currently does not accept pt's medications mailed to [place].   Plan: Medications:  Continue meformin 1000mg  BID. Increase Lantus to 36 units at bedtime. Pt instructed to check her CBG 3 times per day and PRN for hypoglycemia. Pt does not want other insulin during the daytime. Will add glipizide 5mg  BID ac. Provided [facility] Lantus sample (pt has prescription of Lantus sent to her Pharmacy as well) Home glucose monitoring: Frequency:  TID Timing:  before meals and qHS Instruction/counseling given: reminded to get eye exam, reminded to bring blood glucose meter & log to each visit, reminded to bring medications to each visit and discussed diet. Discussed hypoglycemia care/tx.  Educational resources provided:   Self management tools provided:  CBG log book. Other plans: CSW met with pt and provided discount card which allows her to pay $100 per month for Lantus but can only be used 3 times. Pt to follow up in month to assure that she does not run out of her Lantus. Pt encouraged to continue  exercising 3x per week. Pt has appt with her eye doctor next month.

## 2013-01-02 NOTE — Patient Instructions (Addendum)
-  Start taking melatonin 3mg  caps (1-2 tablets) as needed at bedtime for sleep.  -Start Lantus 36 units at bedtime.  -Start taking glipizide 5mg  with brunch and dinner. Check your blood sugar after lunch to make sure it is not too low.  -Bring your log book and medications with you during your next visit.  -Follow up with the appointment for your eye exam.  -Follow up with [place] in one month.

## 2013-01-04 NOTE — Progress Notes (Signed)
Case discussed with Dr. Kennerly soon after the resident saw the patient.  We reviewed the resident's history and exam and pertinent patient test results.  I agree with the assessment, diagnosis, and plan of care documented in the resident's note. 

## 2013-01-18 ENCOUNTER — Other Ambulatory Visit: Payer: Self-pay | Admitting: *Deleted

## 2013-01-18 DIAGNOSIS — E119 Type 2 diabetes mellitus without complications: Secondary | ICD-10-CM

## 2013-01-18 NOTE — Telephone Encounter (Signed)
Brooke Floyd, pt has Embrace meter and can't fine a store that carries those strips. It was from mail order in past.   She will be out of strips on Monday so can you call her and decide what meter will be best and order her strips?

## 2013-01-21 MED ORDER — GLUCOSE BLOOD VI STRP: ORAL_STRIP | Status: DC

## 2013-01-21 MED ORDER — LANCING DEVICE MISC: Status: DC

## 2013-01-21 NOTE — Telephone Encounter (Signed)
Patient found a One touch ultra 2 meter and wants the strips for it and a prescription for a new lancing device as well sent to [facility] for 3x a day testing

## 2013-01-25 ENCOUNTER — Other Ambulatory Visit: Payer: Self-pay | Admitting: Internal Medicine

## 2013-01-25 DIAGNOSIS — I10 Essential (primary) hypertension: Secondary | ICD-10-CM

## 2013-01-28 ENCOUNTER — Ambulatory Visit (INDEPENDENT_AMBULATORY_CARE_PROVIDER_SITE_OTHER): Payer: BC Managed Care – PPO | Admitting: Internal Medicine

## 2013-01-28 ENCOUNTER — Encounter: Payer: Self-pay | Admitting: Internal Medicine

## 2013-01-28 VITALS — BP 140/84 | HR 108 | Ht 62.0 in | Wt 130.0 lb

## 2013-01-28 DIAGNOSIS — J45909 Unspecified asthma, uncomplicated: Secondary | ICD-10-CM

## 2013-01-28 MED ORDER — MONTELUKAST SODIUM 10 MG PO TABS: 10.0000 mg | ORAL_TABLET | Freq: Every day | ORAL | Status: DC
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
## 2013-01-28 NOTE — Patient Instructions (Addendum)
Ok to continue present meds  Sript sent to refill Singulair  Please call as needed

## 2013-02-04 ENCOUNTER — Encounter: Payer: BC Managed Care – PPO | Admitting: Internal Medicine

## 2013-02-09 NOTE — Assessment & Plan Note (Signed)
Good control currently with just rescue inhaler. Plan-refill Singulair and watch need to refill [facility]

## 2013-02-11 NOTE — Addendum Note (Signed)
Addended by: Dorie Rank E on: 02/11/2013 11:22 AM   Modules accepted: Orders

## 2013-02-18 ENCOUNTER — Ambulatory Visit (INDEPENDENT_AMBULATORY_CARE_PROVIDER_SITE_OTHER): Payer: BC Managed Care – PPO | Admitting: Internal Medicine

## 2013-02-18 ENCOUNTER — Encounter: Payer: Self-pay | Admitting: Internal Medicine

## 2013-02-18 ENCOUNTER — Encounter: Payer: BC Managed Care – PPO | Admitting: Internal Medicine

## 2013-02-18 VITALS — BP 144/83 | HR 102 | Temp 97.0°F | Wt 131.3 lb

## 2013-02-18 DIAGNOSIS — I10 Essential (primary) hypertension: Secondary | ICD-10-CM

## 2013-02-18 DIAGNOSIS — F329 Major depressive disorder, single episode, unspecified: Secondary | ICD-10-CM

## 2013-02-18 DIAGNOSIS — J45909 Unspecified asthma, uncomplicated: Secondary | ICD-10-CM

## 2013-02-18 DIAGNOSIS — Z0000 Encounter for general adult medical examination without abnormal findings: Secondary | ICD-10-CM

## 2013-02-18 DIAGNOSIS — R000 Tachycardia, unspecified: Secondary | ICD-10-CM

## 2013-02-18 DIAGNOSIS — E119 Type 2 diabetes mellitus without complications: Secondary | ICD-10-CM

## 2013-02-18 DIAGNOSIS — F3289 Other specified depressive episodes: Secondary | ICD-10-CM

## 2013-02-18 DIAGNOSIS — F32A Depression, unspecified: Secondary | ICD-10-CM

## 2013-02-18 LAB — GLUCOSE, CAPILLARY: Glucose-Capillary: 126 mg/dL — ABNORMAL HIGH (ref 70–99)

## 2013-02-18 LAB — HM DIABETES EYE EXAM

## 2013-02-18 MED ORDER — GLIPIZIDE 5 MG PO TABS: 5.0000 mg | ORAL_TABLET | Freq: Two times a day (BID) | ORAL | Status: DC

## 2013-02-18 NOTE — Assessment & Plan Note (Signed)
She is off Dulera now per her last visit with Dr. Maple Hudson, her Pulmonologist.  She states that her asthma is well controlled but she needed to use her albuterol inhaler just prior to this visit which she believes may have caused her mild tachycardia.

## 2013-02-18 NOTE — Assessment & Plan Note (Addendum)
Lab Results  Component Value Date   HGBA1C 8.1 02/18/2013   HGBA1C 10.5 11/08/2012   HGBA1C 9.0 06/13/2012     Assessment: Diabetes control: fair control Progress toward A1C goal:  improved Comments: Her Hg A1C has improved and I congratulated her for her effort to improve her diabetes control. She continues to take metformin 1000mg  BID and uses Lantus 36 units qHS. She has not been taking glipizide for over one week now. She forgot to bring her home CBG meter log book but states that her CBG values have ranged from 87 before meals to 180 after meals. She has run out of testing strips for over one week though.   Plan: Medications:  continue current medications.  Home glucose monitoring: Frequency: no home glucose monitoring Timing:   Instruction/counseling given: reminded to bring blood glucose meter & log to each visit and discussed diet Educational resources provided: brochure Self management tools provided: home glucose logbook Other plans: She will apply for the [facility] Card for assistance with her Lantus, testing strips, and other medications. She had her retina scan exam today in the [facility]. She was given [facility] sample of Lantus vial 100units/ml, 102ml--she has syringes and needles. She was given printed prescription for glipizide, she stated that she could afford this medication since it is part of the $4 list at [facility].  She was advised to go to [facility] to purchase their [employer] CBG meter with 50 strips which is usually sold for $9--she states she could afford this new meter. Pt advised to check her CBG 3 times per day before meals.  She will follow up in 1 month to assure that she does not run out of her insulin.

## 2013-02-18 NOTE — Assessment & Plan Note (Signed)
Mild, she denies chest pain. This could be explained by her use of the albuterol inhaler just prior to this visit.

## 2013-02-18 NOTE — Progress Notes (Signed)
  Subjective:    Patient ID: Brooke Floyd, female    DOB: May 10, 1965, 47 y.o.   MRN: [id number]  Diabetes Pertinent negatives for hypoglycemia include no confusion, dizziness or headaches. Pertinent negatives for diabetes include no chest pain and no fatigue.   Brooke Floyd is a 47 year old woman with PMH of DM2, HTN, asthma, and hyperlipidemia who comes in for routine follow up visit. She states that she has recently lost her job and her medical insurance and is still not able to afford her Lantus insulin. She has not run out of her insulin but notes that her diet has been not as healthy and she is no longer exercising. She is optimistic about finding a new job, though, and has already secured a job interview for tomorrow. She plans to meet with Rudell Cobb this week for the [facility] application.     Review of Systems  Constitutional: Negative for fever, chills, diaphoresis, activity change, appetite change, fatigue and unexpected weight change.  Respiratory: Negative for cough, shortness of breath and wheezing.   Cardiovascular: Negative for chest pain, palpitations and leg swelling.  Gastrointestinal: Negative for abdominal pain.  Neurological: Negative for dizziness, light-headedness and headaches.  Psychiatric/Behavioral: Negative for behavioral problems, confusion and agitation.       Objective:   Physical Exam  Nursing note and vitals reviewed. Constitutional: She is oriented to person, place, and time. She appears well-developed and well-nourished. No distress.  Eyes: Conjunctivae are normal. No scleral icterus.  Cardiovascular:  Mild tachycardia  Pulmonary/Chest: Effort normal and breath sounds normal. No respiratory distress. She has no wheezes. She has no rales.  Abdominal: Soft.  Musculoskeletal: She exhibits no edema and no tenderness.  Neurological: She is alert and oriented to person, place, and time.  Skin: Skin is warm and dry. She is not diaphoretic.   Psychiatric: She has a normal mood and affect. Her behavior is normal.          Assessment & Plan:

## 2013-02-18 NOTE — Assessment & Plan Note (Signed)
BP Readings from Last 3 Encounters:  02/18/13 144/83  01/28/13 140/84  01/02/13 122/81    Lab Results  Component Value Date   NA 135 06/13/2012   K 4.2 06/13/2012   CREATININE 0.72 06/13/2012    Assessment: Blood pressure control: mildly elevated Progress toward BP goal:  deteriorated Comments: She is on accupril 20mg  daily and assures compliance with this medication. She states that he was very upset with her children just prior to coming to her visit.   Plan: Medications:  continue current medications. Educational resources provided: brochure Self management tools provided: home blood pressure logbook Other plans: Will consider adding HCTZ back if BP elevated again during her next visit.

## 2013-02-18 NOTE — Patient Instructions (Signed)
General Instructions: -You may purchase the new meter at [facility] with the new strips, you don't need a prescription for this. Check your blood sugar 3 times per day, before meals.  -Continue Using Lantus 36 units at bedtime.  -Continue taking glyburide twice per day with meals. I printed the prescription for this medication as it may be cheaper at your new pharmacy.  -Follow up with Rudell Cobb in regards to the [facility].  -Follow up with [place] in 1 month for your diabetes.    Treatment Goals:  Goals (1 Years of Data) as of 02/18/13         As of Today 01/28/13 01/02/13 12/19/12 11/08/12     Blood Pressure    . Blood Pressure < 140/90  144/83 140/84 122/81 115/72 115/74     Diet    . Have 3 meals a day     No No     Result Component    . HEMOGLOBIN A1C < 7.0  8.1    10.5    . LDL CALC < 100            Progress Toward Treatment Goals:  Treatment Goal 02/18/2013  Hemoglobin A1C improved  Blood pressure deteriorated    Self Care Goals & Plans:  Self Care Goal 02/18/2013  Manage my medications take my medicines as prescribed; bring my medications to every visit; refill my medications on time  Monitor my health check my feet daily; bring my glucose meter and log to each visit  Eat healthy foods eat foods that are low in salt; eat baked foods instead of fried foods; eat smaller portions  Be physically active find an activity I enjoy  Meeting treatment goals -    Home Blood Glucose Monitoring 02/18/2013  Check my blood sugar no home glucose monitoring  When to check my blood sugar -     Care Management & Community Referrals:  Referral 02/18/2013  Referrals made for care management support none needed  Referrals made to community resources -

## 2013-02-18 NOTE — Assessment & Plan Note (Signed)
She states that this is well controlled, despite her recent job loss. She is optimistic about finding a new job soon.

## 2013-02-18 NOTE — Assessment & Plan Note (Signed)
She wants to delay her Tdap until she has her [employer].

## 2013-02-19 NOTE — Progress Notes (Signed)
Case discussed with Dr. Kennerly soon after the resident saw the patient.  We reviewed the resident's history and exam and pertinent patient test results.  I agree with the assessment, diagnosis, and plan of care documented in the resident's note. 

## 2013-02-22 ENCOUNTER — Encounter: Payer: Self-pay | Admitting: Dietician

## 2013-03-01 ENCOUNTER — Emergency Department (HOSPITAL_COMMUNITY)
Admission: EM | Admit: 2013-03-01 | Discharge: 2013-03-01 | Disposition: A | Discharge status: Home / Self Care | Payer: No Typology Code available for payment source | Attending: Emergency Medicine | Admitting: Emergency Medicine

## 2013-03-01 ENCOUNTER — Emergency Department (HOSPITAL_COMMUNITY): Payer: No Typology Code available for payment source

## 2013-03-01 ENCOUNTER — Encounter (HOSPITAL_COMMUNITY): Payer: Self-pay | Admitting: Emergency Medicine

## 2013-03-01 DIAGNOSIS — S43499A Other sprain of unspecified shoulder joint, initial encounter: Secondary | ICD-10-CM | POA: Insufficient documentation

## 2013-03-01 DIAGNOSIS — I10 Essential (primary) hypertension: Secondary | ICD-10-CM | POA: Insufficient documentation

## 2013-03-01 DIAGNOSIS — IMO0002 Reserved for concepts with insufficient information to code with codable children: Secondary | ICD-10-CM | POA: Insufficient documentation

## 2013-03-01 DIAGNOSIS — Y92410 Unspecified street and highway as the place of occurrence of the external cause: Secondary | ICD-10-CM | POA: Insufficient documentation

## 2013-03-01 DIAGNOSIS — Y9389 Activity, other specified: Secondary | ICD-10-CM | POA: Insufficient documentation

## 2013-03-01 DIAGNOSIS — Z880 Allergy status to penicillin: Secondary | ICD-10-CM | POA: Insufficient documentation

## 2013-03-01 DIAGNOSIS — S46819A Strain of other muscles, fascia and tendons at shoulder and upper arm level, unspecified arm, initial encounter: Secondary | ICD-10-CM

## 2013-03-01 DIAGNOSIS — Z7982 Long term (current) use of aspirin: Secondary | ICD-10-CM | POA: Insufficient documentation

## 2013-03-01 DIAGNOSIS — Z79899 Other long term (current) drug therapy: Secondary | ICD-10-CM | POA: Insufficient documentation

## 2013-03-01 DIAGNOSIS — K219 Gastro-esophageal reflux disease without esophagitis: Secondary | ICD-10-CM | POA: Insufficient documentation

## 2013-03-01 DIAGNOSIS — S0993XA Unspecified injury of face, initial encounter: Secondary | ICD-10-CM | POA: Insufficient documentation

## 2013-03-01 DIAGNOSIS — Z794 Long term (current) use of insulin: Secondary | ICD-10-CM | POA: Insufficient documentation

## 2013-03-01 DIAGNOSIS — J45909 Unspecified asthma, uncomplicated: Secondary | ICD-10-CM | POA: Insufficient documentation

## 2013-03-01 DIAGNOSIS — E119 Type 2 diabetes mellitus without complications: Secondary | ICD-10-CM | POA: Insufficient documentation

## 2013-03-01 MED ORDER — OXYCODONE-ACETAMINOPHEN 5-325 MG PO TABS: 1.0000 | ORAL_TABLET | Freq: Four times a day (QID) | ORAL | Status: DC | PRN

## 2013-03-01 MED ORDER — DIAZEPAM 5 MG PO TABS: 5.0000 mg | ORAL_TABLET | Freq: Four times a day (QID) | ORAL | Status: DC | PRN

## 2013-03-01 MED ORDER — DIAZEPAM 5 MG PO TABS
5.0000 mg | ORAL_TABLET | Freq: Once | ORAL | Status: AC
  Administered 2013-03-01: 5 mg via ORAL
  Filled 2013-03-01: qty 1

## 2013-03-01 MED ORDER — HYDROCODONE-ACETAMINOPHEN 5-325 MG PO TABS: 1.0000 | ORAL_TABLET | Freq: Once | ORAL | Status: DC

## 2013-03-01 MED ORDER — OXYCODONE-ACETAMINOPHEN 5-325 MG PO TABS
2.0000 | ORAL_TABLET | Freq: Once | ORAL | Status: AC
  Administered 2013-03-01: 2 via ORAL
  Filled 2013-03-01: qty 2
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
## 2013-03-01 NOTE — ED Provider Notes (Signed)
Medical screening examination/treatment/procedure(s) were performed by non-physician practitioner and as supervising physician I was immediately available for consultation/collaboration.  EKG Interpretation   None        Derwood Kaplan, MD 03/01/13 [phone]
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
## 2013-03-01 NOTE — ED Provider Notes (Signed)
Medical screening examination/treatment/procedure(s) were performed by non-physician practitioner and as supervising physician I was immediately available for consultation/collaboration.  EKG Interpretation   None        Derwood Kaplan, MD 03/01/13 [phone]

## 2013-03-13 ENCOUNTER — Other Ambulatory Visit: Payer: Self-pay | Admitting: *Deleted

## 2013-03-14 MED ORDER — MONTELUKAST SODIUM 10 MG PO TABS: 10.0000 mg | ORAL_TABLET | Freq: Every day | ORAL | Status: DC

## 2013-03-15 NOTE — Telephone Encounter (Signed)
rx faxed in 

## 2013-03-25 ENCOUNTER — Encounter: Payer: BC Managed Care – PPO | Admitting: Internal Medicine

## 2013-03-29 ENCOUNTER — Encounter: Payer: Self-pay | Admitting: Dietician

## 2013-03-29 ENCOUNTER — Ambulatory Visit (INDEPENDENT_AMBULATORY_CARE_PROVIDER_SITE_OTHER): Payer: BC Managed Care – PPO | Admitting: Internal Medicine

## 2013-03-29 ENCOUNTER — Encounter: Payer: Self-pay | Admitting: Internal Medicine

## 2013-03-29 DIAGNOSIS — I10 Essential (primary) hypertension: Secondary | ICD-10-CM

## 2013-03-29 NOTE — Progress Notes (Signed)
Patient ID: Brooke Floyd, female   DOB: [birth year], 47 y.o.   MRN: [id number] This encounter was opened in error. Please disregard.

## 2013-04-08 NOTE — Addendum Note (Signed)
Addended by: Bufford Spikes on: 04/08/2013 09:56 AM   Modules accepted: Orders

## 2013-04-19 ENCOUNTER — Other Ambulatory Visit: Payer: Self-pay | Admitting: *Deleted

## 2013-04-19 MED ORDER — "PEN NEEDLES 3/16"" 31G X 5 MM MISC": Status: DC

## 2013-04-19 MED ORDER — INSULIN GLARGINE 100 UNIT/ML SOLOSTAR PEN: 36.0000 [IU] | PEN_INJECTOR | Freq: Every day | SUBCUTANEOUS | Status: DC

## 2013-04-19 NOTE — Telephone Encounter (Signed)
Pt called and wanted to change to solostar pens, please check dose, 1 box of pens is 

## 2013-04-22 ENCOUNTER — Other Ambulatory Visit: Payer: Self-pay | Admitting: Internal Medicine

## 2013-04-22 DIAGNOSIS — E119 Type 2 diabetes mellitus without complications: Secondary | ICD-10-CM

## 2013-04-22 MED ORDER — INSULIN GLARGINE 100 UNITS/ML SOLOSTAR PEN: 36.0000 [IU] | PEN_INJECTOR | Freq: Every day | SUBCUTANEOUS | Status: DC

## 2013-04-22 NOTE — Telephone Encounter (Signed)
Sent in Rx for Lantus solstar pen, inject 36 units at bedtime. 15ml, 5 refills. Thanks! SK

## 2013-04-23 ENCOUNTER — Telehealth: Payer: Self-pay | Admitting: *Deleted

## 2013-04-23 NOTE — Telephone Encounter (Signed)
Call from pt stating her insurance will not be in effect until Jan 2015.  She has requested a sample of lantus insulin to get her by.  Will confirm dose in chart, have sample pulled and have attending MD to sign out sample.  Phone call complete.Criss Alvine, Tkai Large Cassady12/16/201411:20 AM

## 2013-07-08 ENCOUNTER — Encounter: Payer: Self-pay | Admitting: Internal Medicine

## 2013-07-23 ENCOUNTER — Other Ambulatory Visit: Payer: Self-pay | Admitting: Internal Medicine

## 2013-07-30 ENCOUNTER — Ambulatory Visit: Payer: BC Managed Care – PPO | Admitting: Internal Medicine

## 2013-08-05 ENCOUNTER — Encounter: Payer: Self-pay | Admitting: Internal Medicine

## 2013-08-07 ENCOUNTER — Encounter: Payer: Self-pay | Admitting: Internal Medicine

## 2013-08-15 ENCOUNTER — Encounter: Payer: Self-pay | Admitting: Internal Medicine

## 2013-08-15 ENCOUNTER — Ambulatory Visit: Payer: Self-pay | Admitting: Internal Medicine

## 2013-08-23 ENCOUNTER — Emergency Department (HOSPITAL_COMMUNITY)
Admission: EM | Admit: 2013-08-23 | Discharge: 2013-08-23 | Disposition: A | Discharge status: Hospice | Payer: 59 | Source: Home / Self Care | Attending: Family Medicine | Admitting: Family Medicine

## 2013-08-23 ENCOUNTER — Encounter (HOSPITAL_COMMUNITY): Payer: Self-pay | Admitting: Emergency Medicine

## 2013-08-23 ENCOUNTER — Emergency Department (HOSPITAL_COMMUNITY)
Admission: EM | Admit: 2013-08-23 | Discharge: 2013-08-23 | Disposition: A | Discharge status: Home / Self Care | Payer: 59 | Attending: Emergency Medicine | Admitting: Emergency Medicine

## 2013-08-23 DIAGNOSIS — K219 Gastro-esophageal reflux disease without esophagitis: Secondary | ICD-10-CM | POA: Insufficient documentation

## 2013-08-23 DIAGNOSIS — Z79899 Other long term (current) drug therapy: Secondary | ICD-10-CM | POA: Insufficient documentation

## 2013-08-23 DIAGNOSIS — E119 Type 2 diabetes mellitus without complications: Secondary | ICD-10-CM | POA: Insufficient documentation

## 2013-08-23 DIAGNOSIS — R29810 Facial weakness: Secondary | ICD-10-CM

## 2013-08-23 DIAGNOSIS — Z7982 Long term (current) use of aspirin: Secondary | ICD-10-CM | POA: Insufficient documentation

## 2013-08-23 DIAGNOSIS — Z794 Long term (current) use of insulin: Secondary | ICD-10-CM | POA: Insufficient documentation

## 2013-08-23 DIAGNOSIS — G510 Bell's palsy: Secondary | ICD-10-CM

## 2013-08-23 DIAGNOSIS — J45909 Unspecified asthma, uncomplicated: Secondary | ICD-10-CM | POA: Insufficient documentation

## 2013-08-23 DIAGNOSIS — I10 Essential (primary) hypertension: Secondary | ICD-10-CM | POA: Insufficient documentation

## 2013-08-23 DIAGNOSIS — Z880 Allergy status to penicillin: Secondary | ICD-10-CM | POA: Insufficient documentation

## 2013-08-23 MED ORDER — ARTIFICIAL TEARS OP OINT: TOPICAL_OINTMENT | OPHTHALMIC | Status: DC | PRN

## 2013-08-23 MED ORDER — PROCHLORPERAZINE EDISYLATE 5 MG/ML IJ SOLN
10.0000 mg | Freq: Once | INTRAMUSCULAR | Status: AC
  Administered 2013-08-23: 10 mg via INTRAMUSCULAR
  Filled 2013-08-23: qty 2

## 2013-08-23 MED ORDER — KETOROLAC TROMETHAMINE 30 MG/ML IJ SOLN
30.0000 mg | Freq: Once | INTRAMUSCULAR | Status: AC
  Administered 2013-08-23: 30 mg via INTRAMUSCULAR
  Filled 2013-08-23: qty 1

## 2013-08-23 MED ORDER — PREDNISONE 20 MG PO TABS: 60.0000 mg | ORAL_TABLET | Freq: Every day | ORAL | Status: DC

## 2013-08-23 NOTE — ED Notes (Signed)
MD at bedside. 

## 2013-08-23 NOTE — Discharge Instructions (Signed)
Bell's Palsy  Bell's palsy is a condition in which the muscles on one side of the face cannot move (paralysis). This is because the nerves in the face are paralyzed. It is most often thought to be caused by a virus. The virus causes swelling of the nerve that controls movement on one side of the face. The nerve travels through a tight space surrounded by bone. When the nerve swells, it can be compressed by the bone. This results in damage to the protective covering around the nerve. This damage interferes with how the nerve communicates with the muscles of the face. As a result, it can cause weakness or paralysis of the facial muscles.   Injury (trauma), tumor, and surgery may cause Bell's palsy, but most of the time the cause is unknown. It is a relatively common condition. It starts suddenly (abrupt onset) with the paralysis usually ending within 2 days. Bell's palsy is not dangerous. But because the eye does not close properly, you may need care to keep the eye from getting dry. This can include splinting (to keep the eye shut) or moistening with artificial tears. Bell's palsy very seldom occurs on both sides of the face at the same time.  SYMPTOMS    Eyebrow sagging.   Drooping of the eyelid and corner of the mouth.   Inability to close one eye.   Loss of taste on the front of the tongue.   Sensitivity to loud noises.  TREATMENT   The treatment is usually non-surgical. If the patient is seen within the first 24 to 48 hours, a short course of steroids may be prescribed, in an attempt to shorten the length of the condition. Antiviral medicines may also be used with the steroids, but it is unclear if they are helpful.   You will need to protect your eye, if you cannot close it. The cornea (clear covering over your eye) will become dry and can be damaged. Artificial tears can be used to keep your eye moist. Glasses or an eye patch should be worn to protect your eye.  PROGNOSIS   Recovery is variable, ranging  from days to months. Although the problem usually goes away completely (about 80% of cases resolve), predicting the outcome is impossible. Most people improve within 3 weeks of when the symptoms began. Improvement may continue for 3 to 6 months. A small number of people have moderate to severe weakness that is permanent.   HOME CARE INSTRUCTIONS    If your caregiver prescribed medication to reduce swelling in the nerve, use as directed. Do not stop taking the medication unless directed by your caregiver.   Use moisturizing eye drops as needed to prevent drying of your eye, as directed by your caregiver.   Protect your eye, as directed by your caregiver.   Use facial massage and exercises, as directed by your caregiver.   Perform your normal activities, and get your normal rest.  SEEK IMMEDIATE MEDICAL CARE IF:    There is pain, redness or irritation in the eye.   You or your child has an oral temperature above 102 F (38.9 C), not controlled by medicine.  MAKE SURE YOU:    Understand these instructions.   Will watch your condition.   Will get help right away if you are not doing well or get worse.  Document Released: 04/25/2005 Document Revised: 07/18/2011 Document Reviewed: 05/04/2009  Exit[employer] Patient Information 2014 [employer].

## 2013-08-23 NOTE — ED Provider Notes (Signed)
CSN: [id number]     Arrival date & time 08/23/13  1009 History   First MD Initiated Contact with Patient 08/23/13 1108     Chief Complaint  Patient presents with  . Facial Droop     (Consider location/radiation/quality/duration/timing/severity/associated sxs/prior Treatment) The history is provided by the patient.   patient with left-sided facial droop began last night. She has a frontal headache. No trauma. No numbness or weakness. She does have some chewing arise. She states it trouble drinking liquids last night. No difficulty hearing. She does not smoke. She was seen in urgent care on the sent here to rule out stroke. No recent fevers. No recent viral infection. No numbness or weakness, besides the face.  Past Medical History  Diagnosis Date  . Unspecified asthma, with exacerbation   . Other symptoms involving nervous and musculoskeletal systems(781.99)   . Type II or unspecified type diabetes mellitus without mention of complication, not stated as uncontrolled     takes insulin  . GERD (gastroesophageal reflux disease)   . Hypertension    Past Surgical History  Procedure Laterality Date  . Combined hysterectomy abdominal w/ a&p repair / oophorectomy    . Wrist arthroscopy     Family History  Problem Relation Age of Onset  . Coronary artery disease Father    History  Substance Use Topics  . Smoking status: Never Smoker   . Smokeless tobacco: Not on file  . Alcohol Use: No   OB History   Grav Para Term Preterm Abortions TAB SAB Ect Mult Living                 Review of Systems  Constitutional: Negative for activity change and appetite change.  Eyes: Negative for pain.  Respiratory: Negative for chest tightness and shortness of breath.   Cardiovascular: Negative for chest pain and leg swelling.  Gastrointestinal: Negative for nausea, vomiting, abdominal pain and diarrhea.  Genitourinary: Negative for flank pain.  Musculoskeletal: Negative for back pain and neck  stiffness.  Skin: Negative for rash.  Neurological: Positive for weakness and headaches. Negative for numbness.  Psychiatric/Behavioral: Negative for behavioral problems.      Allergies  Celexa; Diclofenac sodium; and Penicillins  Home Medications   Prior to Admission medications   Medication Sig Start Date End Date Taking? Authorizing Provider  aspirin 81 MG tablet Take 81 mg by mouth daily.      Historical Provider, MD  cholecalciferol (VITAMIN D) 1000 UNITS tablet Take 1,000 Units by mouth daily.    Historical Provider, MD  cyclobenzaprine (FLEXERIL) 10 MG tablet TAKE 1 TABLET BY MOUTH TWICE DAILY AS NEEDED FOR MUSCLE SPASM 07/23/13   Blain Pais, MD  diazepam (VALIUM) 5 MG tablet Take 1 tablet (5 mg total) by mouth every 6 (six) hours as needed for anxiety (spasms). 03/01/13   Ruthell Rummage Dammen, PA-C  glipiZIDE (GLUCOTROL) 5 MG tablet Take 1 tablet (5 mg total) by mouth 2 (two) times daily. 02/18/13 02/18/14  Blain Pais, MD  insulin glargine (LANTUS) 100 units/mL SOLN Inject 36 Units into the skin at bedtime. 04/22/13   Blain Pais, MD  Insulin Pen Needle (PEN NEEDLES 3/16") 31G X 5 MM MISC Use once in the evening each day to inject insulin. diag code 250.00. Insulin dependent 04/19/13   Milta Deiters, MD  metFORMIN (GLUCOPHAGE) 1000 MG tablet Take 1 tablet (1,000 mg total) by mouth 2 (two) times daily with a meal. 01/02/13   Blain Pais, MD  montelukast (SINGULAIR) 10 MG tablet Take 1 tablet (10 mg total) by mouth at bedtime. 03/13/13 03/13/14  Blain Pais, MD  Multiple Vitamin (MULTIVITAMIN) capsule Take 2 capsules by mouth daily.     Historical Provider, MD  omeprazole (PRILOSEC) 20 MG capsule Take 20 mg by mouth daily as needed. For acid reflux    Historical Provider, MD  oxyCODONE-acetaminophen (PERCOCET/ROXICET) 5-325 MG per tablet Take 1 tablet by mouth every 6 (six) hours as needed for pain. 03/01/13   Ruthell Rummage Dammen, PA-C  quinapril  (ACCUPRIL) 20 MG tablet TAKE 2 TABLETS BY MOUTH DAILY 01/25/13   Blain Pais, MD  simvastatin (ZOCOR) 80 MG tablet TAKE 1 TABLET BY MOUTH BEFORE BEDTIME 07/23/13   Blain Pais, MD   BP 161/77  Pulse 92  Temp(Src) 98.1 F (36.7 C) (Oral)  Resp 15  Ht 5\' 2"  (1.575 m)  Wt 134 lb 8 oz (61.009 kg)  BMI 24.59 kg/m2  SpO2 98% Physical Exam  Nursing note and vitals reviewed. Constitutional: She is oriented to person, place, and time. She appears well-developed and well-nourished.  HENT:  Head: Normocephalic and atraumatic.  Left-sided facial droop. Forehead involvement.  Eyes: EOM are normal. Pupils are equal, round, and reactive to light.  Neck: Normal range of motion. Neck supple.  Cardiovascular: Normal rate, regular rhythm and normal heart sounds.   No murmur heard. Pulmonary/Chest: Effort normal and breath sounds normal. No respiratory distress. She has no wheezes. She has no rales.  Abdominal: Soft. Bowel sounds are normal. She exhibits no distension. There is no tenderness. There is no rebound and no guarding.  Musculoskeletal: Normal range of motion.  Neurological: She is alert and oriented to person, place, and time. A cranial nerve deficit is present.  Left sided facial droop. Forehead is involved. Difficulty closing left eye. Extraocular is intact. Sensation intact over face. Finger-nose intact bilaterally. Normal ambulation.  Skin: Skin is warm and dry.  Psychiatric: She has a normal mood and affect. Her speech is normal.    ED Course  Procedures (including critical care time) Labs Review Labs Reviewed - No data to display  Imaging Review No results found.   EKG Interpretation None      MDM   Final diagnoses:  Bell's palsy    Patient with left-sided facial droop. Forehead appears to be involved. Likely Bell's palsy. She does have a headache. Patient given Toradol and Compazine was headache much improved. There could be mild improvement of the  forehead and eye drooping, however there is still significant lower face tubing. Likely Bell's palsy. I doubt this is a stroke and think MRI was not made at this time. We'll treat with steroids and have patient followup with her PCP    Jasper Riling. Alvino Chapel, MD 08/23/13 1255

## 2013-08-23 NOTE — ED Provider Notes (Signed)
Brooke Floyd is a 48 y.o. female who presents to Urgent Care today for left-sided facial weakness. Patient developed acute onset of left-sided facial motor weakness last night worsening this morning. She has difficulty retaining fluid in her mouth. She also notes that her left eye is watery and runny. She additionally notes a headache. She denies any other weakness or numbness slurred speech confusion or lack of coordination. Her medical problems include type 2 diabetes, hypertension and hyperlipidemia. She denies any personal history of stroke, coronary artery disease. She feels well without any fevers or chills nausea vomiting or diarrhea. She is concerned she may have a stroke or Bell's palsy.   Past Medical History  Diagnosis Date  . Unspecified asthma, with exacerbation   . Other symptoms involving nervous and musculoskeletal systems(781.99)   . Type II or unspecified type diabetes mellitus without mention of complication, not stated as uncontrolled     takes insulin  . GERD (gastroesophageal reflux disease)   . Hypertension    History  Substance Use Topics  . Smoking status: Never Smoker   . Smokeless tobacco: Not on file  . Alcohol Use: No   ROS as above Medications: No current facility-administered medications for this encounter.   Current Outpatient Prescriptions  Medication Sig Dispense Refill  . aspirin 81 MG tablet Take 81 mg by mouth daily.        . cholecalciferol (VITAMIN D) 1000 UNITS tablet Take 1,000 Units by mouth daily.      . cyclobenzaprine (FLEXERIL) 10 MG tablet TAKE 1 TABLET BY MOUTH TWICE DAILY AS NEEDED FOR MUSCLE SPASM  30 tablet  1  . diazepam (VALIUM) 5 MG tablet Take 1 tablet (5 mg total) by mouth every 6 (six) hours as needed for anxiety (spasms).  5 tablet  0  . glipiZIDE (GLUCOTROL) 5 MG tablet Take 1 tablet (5 mg total) by mouth 2 (two) times daily.  60 tablet  11  . insulin glargine (LANTUS) 100 units/mL SOLN Inject 36 Units into the skin at  bedtime.  15 mL  5  . Insulin Pen Needle (PEN NEEDLES 3/16") 31G X 5 MM MISC Use once in the evening each day to inject insulin. diag code 250.00. Insulin dependent  100 each  4  . metFORMIN (GLUCOPHAGE) 1000 MG tablet Take 1 tablet (1,000 mg total) by mouth 2 (two) times daily with a meal.  60 tablet  11  . montelukast (SINGULAIR) 10 MG tablet Take 1 tablet (10 mg total) by mouth at bedtime.  30 tablet  prn  . Multiple Vitamin (MULTIVITAMIN) capsule Take 2 capsules by mouth daily.       Marland Kitchen omeprazole (PRILOSEC) 20 MG capsule Take 20 mg by mouth daily as needed. For acid reflux      . oxyCODONE-acetaminophen (PERCOCET/ROXICET) 5-325 MG per tablet Take 1 tablet by mouth every 6 (six) hours as needed for pain.  5 tablet  0  . quinapril (ACCUPRIL) 20 MG tablet TAKE 2 TABLETS BY MOUTH DAILY  60 tablet  11  . simvastatin (ZOCOR) 80 MG tablet TAKE 1 TABLET BY MOUTH BEFORE BEDTIME  30 tablet  10    Exam:  BP 161/74  Pulse 97  Temp(Src) 98.1 F (36.7 C) (Oral)  Resp 16  SpO2 100% Gen: Well NAD HEENT: EOMI,  MMM left eye irritation. PERRLA Lungs: Normal work of breathing. CTABL Heart: RRR no soft systolic murmur radiating to the neck Abd: NABS, Soft. NT, ND Exts: Brisk capillary refill, warm  and well perfused.  Neuro: Alert and oriented Left-sided facial motor weakness. Unable to smile symmetrically. Unable to retain air in the mouth. Unable to close left eye completely. The forehead appears to be symmetrical. Sensation is intact throughout Reflexes are diminished but equal bilateral upper and lower extremities. Strength is intact throughout bilateral upper lower extremities. Normal coordination gait and balance.   Assessment and Plan: 48 y.o. female with left-sided facial motor weakness. This is concerning for CVA versus bells palsy. Patient has concomitant headache and elevated blood pressure and systolic murmur radiating to the neck. Plan to transfer to the emergency room for evaluation and  management of this issue. Patient is outside a treatment window for TPA.  Discussed warning signs or symptoms. Please see discharge instructions. Patient expresses understanding.    Gregor Hams, MD 08/23/13 [phone]

## 2013-08-23 NOTE — ED Notes (Signed)
She woke up with droop to L side of face, L eye watery and can not close it, and headache. She went to [facility] and they sent for further eval of bells palsy vs cva

## 2013-08-23 NOTE — ED Notes (Signed)
C/o facial droop which started yesterday Stated she is unable to smile fully  States this is her first time having sx

## 2013-08-29 ENCOUNTER — Encounter: Payer: Self-pay | Admitting: Internal Medicine

## 2013-08-29 ENCOUNTER — Ambulatory Visit (INDEPENDENT_AMBULATORY_CARE_PROVIDER_SITE_OTHER): Payer: 59 | Admitting: Internal Medicine

## 2013-08-29 VITALS — BP 164/86 | HR 89 | Temp 97.6°F | Ht 62.0 in | Wt 133.8 lb

## 2013-08-29 DIAGNOSIS — F32A Depression, unspecified: Secondary | ICD-10-CM

## 2013-08-29 DIAGNOSIS — E785 Hyperlipidemia, unspecified: Secondary | ICD-10-CM

## 2013-08-29 DIAGNOSIS — F329 Major depressive disorder, single episode, unspecified: Secondary | ICD-10-CM

## 2013-08-29 DIAGNOSIS — F3289 Other specified depressive episodes: Secondary | ICD-10-CM

## 2013-08-29 DIAGNOSIS — R011 Cardiac murmur, unspecified: Secondary | ICD-10-CM | POA: Insufficient documentation

## 2013-08-29 DIAGNOSIS — G510 Bell's palsy: Secondary | ICD-10-CM

## 2013-08-29 DIAGNOSIS — I10 Essential (primary) hypertension: Secondary | ICD-10-CM

## 2013-08-29 DIAGNOSIS — E119 Type 2 diabetes mellitus without complications: Secondary | ICD-10-CM

## 2013-08-29 LAB — LIPID PANEL
CHOL/HDL RATIO: 3 ratio
Cholesterol: 155 mg/dL (ref 0–200)
HDL: 52 mg/dL (ref >39)
LDL Cholesterol: 74 mg/dL (ref 0–99)
TRIGLYCERIDES: 143 mg/dL (ref <150)
VLDL: 29 mg/dL (ref 0–40)

## 2013-08-29 LAB — POCT GLYCOSYLATED HEMOGLOBIN (HGB A1C): HEMOGLOBIN A1C: 8.1

## 2013-08-29 LAB — GLUCOSE, CAPILLARY: GLUCOSE-CAPILLARY: 344 mg/dL — AB (ref 70–99)

## 2013-08-29 MED ORDER — VALACYCLOVIR HCL 1 G PO TABS: 1000.0000 mg | ORAL_TABLET | Freq: Three times a day (TID) | ORAL | Status: DC

## 2013-08-29 NOTE — Assessment & Plan Note (Signed)
On statin tx with no muscle pain complaints. Checked lipid panel during this visit.

## 2013-08-29 NOTE — Assessment & Plan Note (Signed)
Not on medications. Denies depression.

## 2013-08-29 NOTE — Assessment & Plan Note (Signed)
She has 2/6 SEM murmur heard best at the LUSB but also heard at the RUSB that radiates to her neck. Pt's last CBC in August 2014 with nl Hg--outflow murmur possible though. No S3 or S4 present, no s/s of HF with no LE edema, no lung crackles, no orthopnea, or DOE. Pt denies CP.  -Ordered 2D echo.  -EKG to be obtained during her next visit.

## 2013-08-29 NOTE — Progress Notes (Signed)
Subjective:    Patient ID: Brooke Floyd, female    DOB: [birth year], 48 y.o.   MRN: [id number]  Diabetes Pertinent negatives for hypoglycemia include no dizziness, headaches or pallor. Associated symptoms include fatigue. Pertinent negatives for diabetes include no chest pain.   Brooke Floyd is a 48 yr old woman with PMH of DM2, HTN, hyperlipidemia, who presents, accompanied by her husband, for ED follow up visit and for diabetes care. She was seen in the ED on 4/17 for acute left sided facial droopiness involving her left eyelid and forehead and was diagnosed and treated for Bell's palsy. She has been taking prednisone since that visit with the last dose scheduled for tomorrow for a total of 6 days of treatment. Her facial droop has improved and she has regained the ability to keep liquids down. She is still unable to close her left eye and is wearing the eye-patch given to her in the ED. Her husband helps her apply the artificial tears ointment in her eye three times per day. She complains of blurry vision and pain in the eye when she is not wearing the eye-patch.   Diabetes: She brings her CBG log book with her today. There are several reads in the 60s to 50s in mornings for March and reads in the 300s for this past week since she started taking prednisone. She states that she eats only one meal per day and she generally does not have a good appetite. She continues to use Lantus 36 units.She denies symptoms associated with hypoglycemia but notes that she had one day of CBG of 500s and had a headache at that time.   Review of Systems  Constitutional: Positive for appetite change and fatigue. Negative for fever, chills and diaphoresis.       Chronic decreased appetite   HENT: Negative for drooling, hearing loss and trouble swallowing.   Eyes: Positive for photophobia, pain and visual disturbance. Negative for discharge, redness and itching.  Respiratory: Negative for cough, choking and  shortness of breath.   Cardiovascular: Negative for chest pain, palpitations and leg swelling.  Gastrointestinal: Negative for nausea, abdominal pain, diarrhea and constipation.  Genitourinary: Negative for dysuria.  Skin: Negative for color change, pallor and rash.  Neurological: Negative for dizziness and headaches.  Psychiatric/Behavioral: Negative for agitation.       Denies depression        Objective:   Physical Exam  Nursing note and vitals reviewed. Constitutional: She is oriented to person, place, and time. She appears well-developed and well-nourished. No distress.  Wearing eye patch over left eye  Eyes: Conjunctivae and EOM are normal. Pupils are equal, round, and reactive to light. Right eye exhibits no discharge. Left eye exhibits no discharge. No scleral icterus.  Left eye with ointment, no signs of trauma.  Pt reports blurry vision from left eye   Cardiovascular: Normal rate and regular rhythm.   Murmur heard. 2/6 SEM heard best at the LUSB but also present in the RUSB, radiates to her neck.   Pulmonary/Chest: Effort normal and breath sounds normal. No respiratory distress. She has no wheezes. She has no rales.  Abdominal: Soft. She exhibits no distension.  Musculoskeletal: She exhibits no edema and no tenderness.  Neurological: She is alert and oriented to person, place, and time. She displays normal reflexes. A cranial nerve deficit is present. She exhibits normal muscle tone. Coordination normal.  Pt able to purse lips, no dysarthria. Able to move forehead but with limitation.  Pt unable to fully shut left eyelid with ~97mm of opening from the lower eyelid.   Skin: Skin is warm and dry. No rash noted. She is not diaphoretic. No erythema.  Psychiatric: She has a normal mood and affect. Her behavior is normal.  Denies depression           Assessment & Plan:

## 2013-08-29 NOTE — Assessment & Plan Note (Addendum)
Per House-Brackmann classification of facial nerve dysfunction she has type IV moderately severe dysfunction and could benefit from antiviral tx in addition to steroid treatment. She is at risk for corneal abrasion due to the inability to completely close her eyes--and she has been applying the artificial tears ointment only 3 times per day. She has not signs of left eye trauma but I do not have fluorescein drops or blue lights to perform examination of her cornea and therefore, she will be referred to Ophthalmology.   Rx valacyclovir 1g TID for 7 days.  Pt with Ophthalmology appointment for this afternoon, she will continue using her artificial tears ointment until then.  Pt to finish prednisone tx tomorrow Pt advised to follow up with [place] if her symptoms worsen--pt notified that 85% of patients have improvement of their symptom in 3 weeks.   Addendum: Pt called to request that valacyclovir be changed to acyclovir with cost difference of $40 to $10 respectively.  Rx called in to her Pharmacy at [facility]. Acyclovir 400mg  5 times daily #35

## 2013-08-29 NOTE — Assessment & Plan Note (Signed)
BP Readings from Last 3 Encounters:  08/29/13 164/86  08/23/13 160/88  08/23/13 161/74    Lab Results  Component Value Date   NA 135 06/13/2012   K 4.2 06/13/2012   CREATININE 0.72 06/13/2012    Assessment: Blood pressure control: mildly elevated Progress toward BP goal:  unchanged Comments: She is on accupril 40mg  daily. BP elevated today, likely 2/2 to current use of prednisone.   Plan: Medications:  continue current medications Educational resources provided:   Self management tools provided:   Other plans: Pt to return next month for BP recheck once prednisone tx is finished.

## 2013-08-29 NOTE — Assessment & Plan Note (Addendum)
Lab Results  Component Value Date   HGBA1C 8.1 08/29/2013   HGBA1C 8.1 02/18/2013   HGBA1C 10.5 11/08/2012     Assessment: Diabetes control: fair control Progress toward A1C goal:   unchanged Comments: HgA1C at 8.1%. Pt has brought her meter that was not downloaded but also had a CBG log book which had several low CBGs in 60s, 50s last month and reports no symptoms at that time, was skipping meals, eating only one meal per day due to chronic decreased appetite (denies depression). Since starting prednisone tx her CBGs have been in the 300s. She continues to use Lantus 36 units daily, metformin 1000mg  BID and glipizide 5 mg twice per day with meals. She has been eating crackers as a "meal".   Plan: Medications:  continue current medications Home glucose monitoring: Frequency: 2 times a day Timing: before meals Instruction/counseling given: reminded to get eye exam, reminded to bring blood glucose meter & log to each visit and reminded to bring medications to each visit Educational resources provided:   Self management tools provided: home glucose logbook Other plans: Pt advised to not skip meals. Pt referred to Butch Penny for further education on choices for small meals (v crackers). Pt advised to return next month once she has finished her tx for prednisone. Diabetic foot exam performed today. Pt has Ophthalmology appointment for this afternoon. Checked urine micro/Cr during this visit.

## 2013-08-29 NOTE — Patient Instructions (Signed)
General Instructions: -Try to eat at least 3 small meals per day.  -Start taking valacyclovir 1000mg  three times per day, take this for 7 days. This is for the Bell's Palsy.  -Follow up with your eye doctor tomorrow. Continue using the eye patch and the eye ointment.  -Follow up with [place] in one month or sooner if your symptoms do not improve.    Please bring your medicines with you each time you come.   Medicines may be  Eye drops  Herbal   Vitamins  Pills  Seeing these help [place] take care of you.  Treatment Goals:  Goals (1 Years of Data) as of 08/29/13         As of Today 08/23/13 08/23/13 08/23/13 08/23/13     Blood Pressure    . Blood Pressure < 140/90  164/86 160/88 161/77 155/75 161/74     Diet    . Have 3 meals a day           Result Component    . HEMOGLOBIN A1C < 7.0  8.1        . LDL CALC < 100            Progress Toward Treatment Goals:  Treatment Goal 08/29/2013  Hemoglobin A1C -  Blood pressure unchanged    Self Care Goals & Plans:  Self Care Goal 08/29/2013  Manage my medications bring my medications to every visit; refill my medications on time  Monitor my health keep track of my blood glucose; check my feet daily  Eat healthy foods -  Be physically active -  Other (No Data)  Meeting treatment goals -    Home Blood Glucose Monitoring 08/29/2013  Check my blood sugar 2 times a day  When to check my blood sugar before meals     Care Management & Community Referrals:  Referral 02/18/2013  Referrals made for care management support none needed  Referrals made to community resources -

## 2013-08-30 LAB — MICROALBUMIN / CREATININE URINE RATIO
Creatinine, Urine: 78.6 mg/dL
Microalb Creat Ratio: 112.3 mg/g — ABNORMAL HIGH (ref 0.0–30.0)
Microalb, Ur: 8.83 mg/dL — ABNORMAL HIGH (ref 0.00–1.89)

## 2013-08-30 MED ORDER — ACYCLOVIR 400 MG PO TABS: 400.0000 mg | ORAL_TABLET | Freq: Every day | ORAL | Status: DC

## 2013-08-30 NOTE — Addendum Note (Signed)
Addended by: Adele Barthel D on: 08/30/2013 05:42 PM   Modules accepted: Orders, Medications

## 2013-09-01 NOTE — Progress Notes (Signed)
Patient ID: Brooke Floyd, female   DOB: 03/04/1966, 48 y.o.   MRN: [id number] Case discussed with Dr. Hayes Ludwig soon after the resident saw the patient.  We reviewed the resident's history and exam and pertinent patient test results.  I agree with the assessment, diagnosis, and plan of care documented in the resident's note.

## 2013-09-06 ENCOUNTER — Ambulatory Visit: Payer: No Typology Code available for payment source | Admitting: Internal Medicine

## 2013-09-24 ENCOUNTER — Encounter: Payer: No Typology Code available for payment source | Admitting: Dietician

## 2013-09-28 ENCOUNTER — Other Ambulatory Visit: Payer: Self-pay | Admitting: Internal Medicine

## 2013-09-30 ENCOUNTER — Other Ambulatory Visit: Payer: Self-pay | Admitting: Internal Medicine

## 2013-10-01 NOTE — Addendum Note (Signed)
Addended by: Hulan Fray on: 10/01/2013 08:50 PM   Modules accepted: Orders

## 2013-10-07 ENCOUNTER — Other Ambulatory Visit: Payer: Self-pay | Admitting: Internal Medicine

## 2013-10-07 ENCOUNTER — Telehealth: Payer: Self-pay | Admitting: *Deleted

## 2013-10-07 DIAGNOSIS — E119 Type 2 diabetes mellitus without complications: Secondary | ICD-10-CM

## 2013-10-07 MED ORDER — INSULIN GLARGINE 100 UNITS/ML SOLOSTAR PEN: 36.0000 [IU] | PEN_INJECTOR | Freq: Every day | SUBCUTANEOUS | Status: DC

## 2013-10-07 NOTE — Addendum Note (Signed)
Addended by: Marcelino Duster on: 10/07/2013 03:30 PM   Modules accepted: Orders

## 2013-10-07 NOTE — Telephone Encounter (Signed)
Pt called - unable to afford insurance care. Has had no insulin - CBG 300+. Dr Hayes Ludwig aware - card and Rx for 5 sample lantus solostar given to pt. Pt states has used solostar before. Pt aware - needs to find affordable care due to insulin. Hilda Blades Jimeka Balan RN 10/07/13 3PM

## 2013-11-03 ENCOUNTER — Other Ambulatory Visit: Payer: Self-pay | Admitting: Internal Medicine

## 2013-11-04 ENCOUNTER — Other Ambulatory Visit: Payer: Self-pay | Admitting: *Deleted

## 2013-11-04 ENCOUNTER — Ambulatory Visit (INDEPENDENT_AMBULATORY_CARE_PROVIDER_SITE_OTHER): Payer: Self-pay | Admitting: Internal Medicine

## 2013-11-04 ENCOUNTER — Encounter: Payer: Self-pay | Admitting: Internal Medicine

## 2013-11-04 ENCOUNTER — Telehealth: Payer: Self-pay | Admitting: Dietician

## 2013-11-04 VITALS — BP 166/81 | HR 100 | Temp 98.7°F | Wt 133.1 lb

## 2013-11-04 DIAGNOSIS — E119 Type 2 diabetes mellitus without complications: Secondary | ICD-10-CM

## 2013-11-04 DIAGNOSIS — I10 Essential (primary) hypertension: Secondary | ICD-10-CM

## 2013-11-04 DIAGNOSIS — G510 Bell's palsy: Secondary | ICD-10-CM

## 2013-11-04 DIAGNOSIS — R29818 Other symptoms and signs involving the nervous system: Secondary | ICD-10-CM

## 2013-11-04 LAB — GLUCOSE, CAPILLARY: GLUCOSE-CAPILLARY: 177 mg/dL — AB (ref 70–99)

## 2013-11-04 MED ORDER — CYCLOBENZAPRINE HCL 10 MG PO TABS: 10.0000 mg | ORAL_TABLET | Freq: Three times a day (TID) | ORAL | Status: DC | PRN

## 2013-11-04 NOTE — Patient Instructions (Signed)
General Instructions: -Good job eating healthier and exercising! -Start using Lantus 28 units at bedtime and check your blood sugar in the morning, as soon as you wake up, and before meals.  -You may try Zyrtec over-the-counter for allergies.  -Follow up with [place] in 2-4 weeks for your diabetes. Call us if you are still getting low blood sugars reads.   Please bring your medicines with you each time you come.   Medicines may be  Eye drops  Herbal   Vitamins  Pills  Seeing these help [place] take care of you.    Treatment Goals:  Goals (1 Years of Data) as of 11/04/13         As of Today 08/29/13 08/23/13 08/23/13 08/23/13     Blood Pressure    . Blood Pressure < 140/90  166/81 164/86 160/88 161/77 155/75     Diet    . Have 3 meals a day           Result Component    . HEMOGLOBIN A1C < 7.0   8.1       . LDL CALC < 100   74         Progress Toward Treatment Goals:  Treatment Goal 11/04/2013  Hemoglobin A1C improved  Blood pressure unchanged    Self Care Goals & Plans:  Self Care Goal 11/04/2013  Manage my medications bring my medications to every visit; take my medicines as prescribed  Monitor my health keep track of my blood glucose; bring my glucose meter and log to each visit; check my feet daily; keep track of my weight  Eat healthy foods eat foods that are low in salt; eat baked foods instead of fried foods; drink diet soda or water instead of juice or soda; eat smaller portions  Be physically active -  Other -  Meeting treatment goals -    Home Blood Glucose Monitoring 11/04/2013  Check my blood sugar 3 times a day  When to check my blood sugar after breakfast; after lunch; at bedtime     Care Management & Community Referrals:  Referral 11/04/2013  Referrals made for care management support none needed  Referrals made to community resources -

## 2013-11-04 NOTE — Telephone Encounter (Signed)
Has been having a lot of low blood sugars:  June 27- 81mg /dl, June 28- 47mg /dl at 243 am-, 327 am  Took it again it was 86mg /dl, Bedtime last night it was  76mg /dl,  This am at 201 am  It was 36mg /dl ate banana and orange it was 70mg /dl-225 ate ginger snaps    Medications:  Glipizide 5 mg twice a day Metformin 1000 mg twice a day  36 units insulin  lantus At bedtime-  Lowered insulin to 34 one day last week June 11 it wad 49, 63 on the 14th,   Has decreased her intake and has been exercising more to try to lower her diabetes medications. Eats fruit for breakfast, lunch- leftovers- meat and 2 vegetables dinner-salmon rice, spinach,  Dinner. Now working and walking 25 minutes/day and also walking on treadmill 3 times a week   Advised patient to lower insulin to 30 units until she can see dr. Hayes Ludwig and schedule an appontment with Dr. Hayes Ludwig as soon as possible. She asked if she could decreases the glipizide.She was able to schedule an  appointment this afternoon with dr. Hayes Ludwig

## 2013-11-07 MED ORDER — GLIPIZIDE 5 MG PO TABS: 5.0000 mg | ORAL_TABLET | Freq: Two times a day (BID) | ORAL | Status: DC

## 2013-11-07 MED ORDER — QUINAPRIL HCL 20 MG PO TABS: 20.0000 mg | ORAL_TABLET | Freq: Two times a day (BID) | ORAL | Status: DC

## 2013-11-07 MED ORDER — INSULIN GLARGINE 100 UNITS/ML SOLOSTAR PEN: 28.0000 [IU] | PEN_INJECTOR | Freq: Every day | SUBCUTANEOUS | Status: DC

## 2013-11-07 NOTE — Assessment & Plan Note (Signed)
She takes Flexeril occasionally for aches and pains and I refilled this medication just prior to this visit.

## 2013-11-07 NOTE — Progress Notes (Signed)
Case discussed with Dr. Kennerly soon after the resident saw the patient.  We reviewed the resident's history and exam and pertinent patient test results.  I agree with the assessment, diagnosis, and plan of care documented in the resident's note. 

## 2013-11-07 NOTE — Assessment & Plan Note (Signed)
Lab Results  Component Value Date   HGBA1C 8.1 08/29/2013   HGBA1C 8.1 02/18/2013   HGBA1C 10.5 11/08/2012     Assessment: Diabetes control: fair control Progress toward A1C goal:  improved Comments: She is on metformin 1000mg  BID ac, Lantus 34 units qHs, glipizide 5mg  BID ac. She does not eat regular 3 meals per day, sometimes 2 meals per day and does not always take glipizide. I am concerned about her hypoglycemia at 2-3AM to 40s recently and would like to reduce her Lantus dose. She has been more physically active, eating low calorie foods and therefore, has a lower insulin dose requirement at this time.   Plan: Medications:  continue metformin and glipizde at this time but will recude Lantus dose fto 28 units qHS. She was advised to check her BS at least 3 times perday, in the morning and before meals as well as when she has symptoms of hypoglycemia.  Home glucose monitoring: Frequency: 3 times a day Timing: after breakfast;after lunch;at bedtime Instruction/counseling given: reminded to bring medications to each visit Educational resources provided: brochure Self management tools provided: copy of home glucose meter download Other plans: She will follow up with [place] in 2 weeks or call us sooner if her CBG as still persistently low. I am not sure glipized is problematic for her a this time but if she is persistently hypoglycemic, will discontinue this medication and further reduce her Lantus dose.  Performed diabetic foot exam during this visit.

## 2013-11-07 NOTE — Assessment & Plan Note (Signed)
BP Readings from Last 3 Encounters:  11/04/13 166/81  08/29/13 164/86  08/23/13 160/88    Lab Results  Component Value Date   NA 135 06/13/2012   K 4.2 06/13/2012   CREATININE 0.72 06/13/2012    Assessment: Blood pressure control: mildly elevated Progress toward BP goal:  unchanged Comments: She is on quinapril 20mg  BID, she took her dose the night prior to this visit but did not take her morning dose. Her BP has been elevated for her past visits but she reports that she always forgets to take her medications prior to coming to her clinic visits. She also reports taking Ibuprofen regularly  for headache and other occasional aches which could, like all NSAIDs, potentially lead to an increase in blood pressure.   Plan: Medications:  continue current medications Educational resources provided: brochure Self management tools provided: home blood pressure logbook Other plans: Patient advised to follow up with [place] in 2-3 weeks but to take her medication prior to her visit. She was also instructed to avoid NSAIDs, she agrees to take Tylenol as needed for headaches and other minor aches.

## 2013-11-07 NOTE — Assessment & Plan Note (Signed)
Her symptoms have completely resolved. She reports feeling some tingling over her left eye a few weeks ago but this has subsided.

## 2013-11-07 NOTE — Progress Notes (Signed)
   Subjective:    Patient ID: Brooke Floyd, female    DOB: 1965-05-24, 48 y.o.   MRN: [id number]  Diabetes Pertinent negatives for hypoglycemia include no dizziness or seizures. Pertinent negatives for diabetes include no chest pain, no fatigue and no weakness.   Brooke Floyd is a 48 yr old woman with PMH of DM2, HTN, HLP, presenting for diabetes follow up. She met with Debera Lat, the diabetes educator, the morning prior to this visit and was found to be having recurrent hypoglycemia at home. In reviewing her CBG meter download she has had low blood sugars this month despite lowering her Lantus from 36 units to 34 units. Her most low BS have been at 2-3 AM at 47-81. She usually feels sweaty and wakes up to check her BS at those times. Her BS appropriately increases to 80s once she eats a sugary food or drinks juice.  For the past month or so she has been walking more (25 minutes per day), eating fruits for breakfast and a small lunch and dinner. She reports that usually eats only 2 meals per day most days and does not always remember to take glipizide with her meals. She checks her BS 3 times daily, mostly after meals and they are typically in the 200s range.   Of note, her vision in her left eye has completely recovered and she no longer has symptoms of Bell's palsy.      Review of Systems  Constitutional: Negative for fever, chills, diaphoresis, appetite change, fatigue and unexpected weight change.  HENT: Negative for congestion, facial swelling, hearing loss, postnasal drip and rhinorrhea.   Eyes: Negative for photophobia, pain, redness and visual disturbance.  Respiratory: Negative for cough and shortness of breath.   Cardiovascular: Negative for chest pain.  Gastrointestinal: Negative for abdominal pain.  Genitourinary: Negative for dysuria.  Skin: Negative for color change.  Neurological: Negative for dizziness, seizures and weakness.  Psychiatric/Behavioral: Negative for  agitation.       Objective:   Physical Exam  Nursing note and vitals reviewed. Constitutional: She is oriented to person, place, and time. She appears well-developed and well-nourished. No distress.  HENT:  Head: Normocephalic.  Eyes: Conjunctivae are normal. Right eye exhibits no discharge. Left eye exhibits no discharge. No scleral icterus.  Cardiovascular: Normal rate.   Pulmonary/Chest: Effort normal. No respiratory distress.  Musculoskeletal: She exhibits no edema and no tenderness.  Neurological: She is alert and oriented to person, place, and time.  Skin: Skin is warm and dry. No rash noted. She is not diaphoretic. No erythema. No pallor.  Psychiatric: She has a normal mood and affect. Her behavior is normal.          Assessment & Plan:

## 2013-11-15 ENCOUNTER — Other Ambulatory Visit: Payer: Self-pay | Admitting: Internal Medicine

## 2013-12-06 ENCOUNTER — Other Ambulatory Visit: Payer: Self-pay | Admitting: Internal Medicine

## 2013-12-23 ENCOUNTER — Encounter: Payer: 59 | Admitting: Internal Medicine

## 2013-12-30 ENCOUNTER — Encounter: Payer: 59 | Admitting: Internal Medicine

## 2013-12-31 ENCOUNTER — Other Ambulatory Visit: Payer: Self-pay | Admitting: *Deleted

## 2014-01-01 MED ORDER — MONTELUKAST SODIUM 10 MG PO TABS: 10.0000 mg | ORAL_TABLET | Freq: Every day | ORAL | Status: DC

## 2014-01-01 NOTE — Addendum Note (Signed)
Addended by: Adele Barthel D on: 01/01/2014 07:11 AM   Modules accepted: Orders

## 2014-01-03 ENCOUNTER — Telehealth: Payer: Self-pay | Admitting: Internal Medicine

## 2014-01-03 NOTE — Telephone Encounter (Signed)
Patient returning [phone]

## 2014-01-03 NOTE — Telephone Encounter (Signed)
lmomtcb x1 

## 2014-01-06 NOTE — Telephone Encounter (Signed)
lmomtcb  

## 2014-01-08 NOTE — Telephone Encounter (Signed)
LMTCB

## 2014-01-09 NOTE — Telephone Encounter (Signed)
LMTCB and will close per triage protocol 

## 2014-01-10 ENCOUNTER — Encounter: Payer: 59 | Admitting: Internal Medicine

## 2014-01-10 ENCOUNTER — Encounter: Payer: Self-pay | Admitting: Internal Medicine

## 2014-01-14 ENCOUNTER — Telehealth: Payer: Self-pay | Admitting: Internal Medicine

## 2014-01-14 NOTE — Telephone Encounter (Signed)
LMTCB x 1 

## 2014-01-14 NOTE — Progress Notes (Signed)
This encounter was created in error - please disregard.

## 2014-01-15 MED ORDER — MOMETASONE FURO-FORMOTEROL FUM 100-5 MCG/ACT IN AERO
2.0000 | INHALATION_SPRAY | Freq: Two times a day (BID) | RESPIRATORY_TRACT | Status: DC
Start: 2014-01-15 — End: 2014-10-13

## 2014-01-15 MED ORDER — MOMETASONE FURO-FORMOTEROL FUM 100-5 MCG/ACT IN AERO: 2.0000 | INHALATION_SPRAY | Freq: Two times a day (BID) | RESPIRATORY_TRACT | Status: DC

## 2014-01-15 NOTE — Telephone Encounter (Signed)
Spoke with pt-- requesting sample of Dulera and Rx sent to [facility] Pt aware that sample is ready for pick up and Rx sent.  Pt requesting an OV with Dr Melida Quitter to schedule at time of phone call--will call back later to schedule this.  Nothing further needed.

## 2014-01-20 ENCOUNTER — Encounter: Payer: Self-pay | Admitting: Internal Medicine

## 2014-01-20 ENCOUNTER — Ambulatory Visit (INDEPENDENT_AMBULATORY_CARE_PROVIDER_SITE_OTHER): Payer: No Typology Code available for payment source | Admitting: Internal Medicine

## 2014-01-20 VITALS — BP 167/95 | HR 106 | Temp 98.2°F | Wt 134.6 lb

## 2014-01-20 DIAGNOSIS — J45909 Unspecified asthma, uncomplicated: Secondary | ICD-10-CM

## 2014-01-20 DIAGNOSIS — E119 Type 2 diabetes mellitus without complications: Secondary | ICD-10-CM

## 2014-01-20 DIAGNOSIS — I10 Essential (primary) hypertension: Secondary | ICD-10-CM

## 2014-01-20 DIAGNOSIS — Z23 Encounter for immunization: Secondary | ICD-10-CM

## 2014-01-20 DIAGNOSIS — Z0000 Encounter for general adult medical examination without abnormal findings: Secondary | ICD-10-CM

## 2014-01-20 LAB — POCT GLYCOSYLATED HEMOGLOBIN (HGB A1C): HEMOGLOBIN A1C: 7.6

## 2014-01-20 LAB — GLUCOSE, CAPILLARY: Glucose-Capillary: 257 mg/dL — ABNORMAL HIGH (ref 70–99)

## 2014-01-20 MED ORDER — HYDROCHLOROTHIAZIDE 12.5 MG PO CAPS: 12.5000 mg | ORAL_CAPSULE | Freq: Every day | ORAL | Status: DC

## 2014-01-20 MED ORDER — ALBUTEROL SULFATE HFA 108 (90 BASE) MCG/ACT IN AERS: 2.0000 | INHALATION_SPRAY | Freq: Four times a day (QID) | RESPIRATORY_TRACT | Status: DC | PRN

## 2014-01-20 NOTE — Progress Notes (Signed)
   Subjective:    Patient ID: Brooke Floyd, female    DOB: 1965/11/12, 48 y.o.   MRN: [id number]  HPI Ms. Wakeman is a 48 year old woman with PMH of HTN, DM2, asthma, HLP, GERD, who presents for follow up visit for her blood pressure and diabetes.  She states that her blood pressure has been elevated at home to the 170s when she checks. 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  She denies hypoglycemic episodes but as discontinued walking and exercising due to concerns for hypoglycemia as she had low BS 4 months ago while taking higher dose of Lantus.  She states she had an asthma attack last week after running out of Singulair but this improved once she used albuterol inhaler. She has refilled her Singulair.   She requests a work note for her employer for today's visit.    Review of Systems  Constitutional: Negative for fever, chills, diaphoresis, activity change, appetite change, fatigue and unexpected weight change.  Respiratory: Negative for cough, shortness of breath and wheezing.   Cardiovascular: Negative for chest pain, palpitations and leg swelling.  Gastrointestinal: Negative for abdominal pain.  Genitourinary: Negative for dysuria.  Neurological: Negative for dizziness, weakness, light-headedness and headaches.  Psychiatric/Behavioral: Negative for agitation.       Objective:   Physical Exam  Nursing note and vitals reviewed. Constitutional: She is oriented to person, place, and time. She appears well-developed and well-nourished. No distress.  Cardiovascular: Regular rhythm.   Mild tachycardia  Pulmonary/Chest: Effort normal. No respiratory distress. She has no wheezes. She has no rales.  Musculoskeletal: She exhibits no edema.  Neurological: She is alert and oriented to person, place, and time.  Skin: Skin is warm and dry. She is not diaphoretic.  Psychiatric: She has a normal mood and affect.            Assessment & Plan:

## 2014-01-20 NOTE — Assessment & Plan Note (Signed)
She is on Singulair and Dulera Has albuterol inhaler PRN She wants to continue taking ASA despite possibility of trigger of asthma attack with this medication

## 2014-01-20 NOTE — Patient Instructions (Signed)
General Instructions: -Start taking hydrochlorothiazide 12.5mg  daily in addition to the quinapril 20mg  twice per day.  -Check your blood pressure at home once per day. Your goal blood pressure is less than 140/90 but more than 100/60. -Continue using Lantus 28 units at night, and taking glipizide 5mg  and metformin 1000mg  twice per day with meals.  -Follow up in 1 month or sooner as needed.   Thank you for bringing your medicines today. This helps [place] keep you safe from mistakes.  Treatment Goals:  Goals (1 Years of Data) as of 01/20/14         As of Today 11/04/13 08/29/13 08/23/13 08/23/13     Blood Pressure    . Blood Pressure < 140/90  167/95 166/81 164/86 160/88 161/77     Diet    . Have 3 meals a day   No        Result Component    . HEMOGLOBIN A1C < 7.0    8.1      . LDL CALC < 100    74        Progress Toward Treatment Goals:  Treatment Goal 01/20/2014  Hemoglobin A1C unable to assess  Blood pressure unchanged    Self Care Goals & Plans:  Self Care Goal 01/20/2014  Manage my medications bring my medications to every visit; refill my medications on time  Monitor my health -  Eat healthy foods -  Be physically active take a walk every day  Other -  Meeting treatment goals maintain the current self-care plan    Home Blood Glucose Monitoring 01/20/2014  Check my blood sugar once a day  When to check my blood sugar before breakfast     Care Management & Community Referrals:  Referral 11/04/2013  Referrals made for care management support none needed  Referrals made to community resources -

## 2014-01-20 NOTE — Assessment & Plan Note (Signed)
Lab Results  Component Value Date   HGBA1C 7.6 01/20/2014   HGBA1C 8.1 08/29/2013   HGBA1C 8.1 02/18/2013     Assessment: Diabetes control: fair control Progress toward A1C goal:  unable to assess Comments: She is on Lantus 28 units qHS and glipizide 5mg  ac BID, and metformin 1000mg  BID ac. She has stopped exercising. She denies hypoglycemia s/s. Her BS has been in 140-200s before breakfast.  Plan: Medications:  continue current medications Home glucose monitoring: Frequency: once a day Timing: before breakfast Instruction/counseling given: reminded to get eye exam, reminded to bring blood glucose meter & log to each visit and reminded to bring medications to each visit Educational resources provided:   Self management tools provided:   Other plans: Follow up in 1 month. She will begin exercising again and will call us if her BS is <80 in before breakfast. She was given pamphlet for [facility] eye exam and will call them to set up the appointment.

## 2014-01-20 NOTE — Assessment & Plan Note (Signed)
She received the flu vaccine during this visit.

## 2014-01-20 NOTE — Assessment & Plan Note (Addendum)
BP Readings from Last 3 Encounters:  01/20/14 167/95  11/04/13 166/81  08/29/13 164/86    Lab Results  Component Value Date   NA 135 06/13/2012   K 4.2 06/13/2012   CREATININE 0.72 06/13/2012    Assessment: Blood pressure control: mildly elevated Progress toward BP goal:  unchanged Comments: BP elevated during previous visits as well. No ibuprofen use recently. She is on quinapril 20mg  BID.   Plan: Medications:  continue current medications and add HCTZ 12.5mg  daily Educational resources provided:   Self management tools provided:   Other plans: Due to her current employment schedule she can only return in 1 month. She was encouraged to continue checking her BP at home.

## 2014-01-22 ENCOUNTER — Other Ambulatory Visit: Payer: Self-pay | Admitting: Internal Medicine

## 2014-01-22 NOTE — Progress Notes (Signed)
Internal Medicine Clinic Attending Date of visit: 01/20/2014   Case discussed with Dr. Hayes Ludwig soon after the resident saw the patient.  We reviewed the resident's history and exam and pertinent patient test results.  I agree with the assessment, diagnosis, and plan of care documented in the resident's note.

## 2014-01-24 ENCOUNTER — Other Ambulatory Visit: Payer: Self-pay | Admitting: *Deleted

## 2014-01-24 DIAGNOSIS — E119 Type 2 diabetes mellitus without complications: Secondary | ICD-10-CM

## 2014-01-24 MED ORDER — CYCLOBENZAPRINE HCL 10 MG PO TABS: 10.0000 mg | ORAL_TABLET | Freq: Three times a day (TID) | ORAL | Status: DC | PRN

## 2014-01-24 MED ORDER — SIMVASTATIN 80 MG PO TABS: 80.0000 mg | ORAL_TABLET | Freq: Every day | ORAL | Status: DC

## 2014-01-24 MED ORDER — MONTELUKAST SODIUM 10 MG PO TABS: 10.0000 mg | ORAL_TABLET | Freq: Every day | ORAL | Status: DC

## 2014-01-24 MED ORDER — GLIPIZIDE 5 MG PO TABS: 5.0000 mg | ORAL_TABLET | Freq: Two times a day (BID) | ORAL | Status: DC

## 2014-01-24 MED ORDER — OMEPRAZOLE 20 MG PO CPDR: 20.0000 mg | DELAYED_RELEASE_CAPSULE | Freq: Every day | ORAL | Status: DC

## 2014-01-25 ENCOUNTER — Other Ambulatory Visit: Payer: Self-pay | Admitting: Internal Medicine

## 2014-01-29 ENCOUNTER — Telehealth: Payer: Self-pay | Admitting: *Deleted

## 2014-01-29 NOTE — Telephone Encounter (Signed)
Received PA request from pt's pharmacyAmmie [facility]) for her Lantus Solostar, form also stated that Levemir was the preferred, but will need MD approval to switch.  Will forward request to MD for review.  Please advise.Brooke Hidden Cassady9/23/20159:27 AM         [phone] to initiate PA)

## 2014-01-29 NOTE — Telephone Encounter (Addendum)
MD wishes to complete form for Lantus PA.  [employer], they will fax form today.  I was also informed that pt's current insurance plan will expire on 02/05/14 and that approval on the Lantus pen was unlikely given that pt has no documented trial and failure on Levemir, which is there preferred medication.  Will wait for form and fax back once completed by MD.Brooke Talamante Cassady9/23/201511:51 AM  Pt ID# [id number]

## 2014-01-30 NOTE — Telephone Encounter (Signed)
Discussed with MD, pt will be changed to the preferred medication-Levemir for insurance purposes.  Message left on pt's recorder that there may be a change in her medication and to contact office for further details. Regenia Skeeter, Darlene Cassady9/24/20154:46 PM

## 2014-02-06 ENCOUNTER — Other Ambulatory Visit: Payer: Self-pay | Admitting: *Deleted

## 2014-02-07 ENCOUNTER — Telehealth: Payer: Self-pay | Admitting: Dietician

## 2014-02-07 MED ORDER — INSULIN GLARGINE 100 UNIT/ML SOLOSTAR PEN: 28.0000 [IU] | PEN_INJECTOR | Freq: Every day | SUBCUTANEOUS | Status: DC

## 2014-02-07 NOTE — Telephone Encounter (Signed)
Patient left message that she had to cancel her insurance and hopes to renew with them next year. she asked [place] to fax a lantus savings card [facility] on [address]. I left her a message to call lantus savings program's toll free number [phone] or that she can come to the office on Monday to pick up a savings card or ask to have her insulin changes to [facility] insulin that she can purchase for 25$ until her new insurance starts.  Called [facility] per patient request about faxing them a lantus savings card. They agreed to try to process it and confirmed patient's lantus RX is active. Card was faxed.

## 2014-02-07 NOTE — Telephone Encounter (Signed)
Pt aware Rx was done.  Wants coupon for med - no insurance - pt and husband working - over income for [employer]. Suggest for pt to call Butch Penny Plyler  About insulin and no money - too busy now at work. Butch Penny aware.

## 2014-02-11 NOTE — Telephone Encounter (Signed)
Thanks for faxing it the card for her! Hopefully she will be able to afford her Lantus and stay compliant!  SK

## 2014-02-12 ENCOUNTER — Encounter: Payer: Self-pay | Admitting: *Deleted

## 2014-02-12 ENCOUNTER — Other Ambulatory Visit: Payer: Self-pay | Admitting: Internal Medicine

## 2014-02-21 ENCOUNTER — Telehealth: Payer: Self-pay | Admitting: Internal Medicine

## 2014-02-21 NOTE — Telephone Encounter (Signed)
No samples available  I made pt aware  I have mailed her [employer] assistance forms

## 2014-03-06 ENCOUNTER — Telehealth: Payer: Self-pay | Admitting: Internal Medicine

## 2014-03-11 NOTE — Telephone Encounter (Signed)
CY has completed and sign the forms; I have notified patient as well. We have to mail the original form in to [facility] as they do not offer a fax in option per rep. I have left a message on patients voicemail stating this. Copy mailed to patients confirmed home address and a copy made for our records. Nothing more needed at this time.

## 2014-03-31 ENCOUNTER — Encounter: Payer: No Typology Code available for payment source | Admitting: Internal Medicine

## 2014-04-02 ENCOUNTER — Encounter: Payer: Self-pay | Admitting: Internal Medicine

## 2014-05-21 ENCOUNTER — Other Ambulatory Visit: Payer: Self-pay | Admitting: Internal Medicine

## 2014-06-06 ENCOUNTER — Encounter: Payer: Self-pay | Admitting: Internal Medicine

## 2014-06-14 ENCOUNTER — Other Ambulatory Visit: Payer: Self-pay | Admitting: Internal Medicine

## 2014-06-28 ENCOUNTER — Other Ambulatory Visit: Payer: Self-pay | Admitting: Internal Medicine

## 2014-07-17 ENCOUNTER — Other Ambulatory Visit: Payer: Self-pay | Admitting: Internal Medicine

## 2014-08-15 ENCOUNTER — Other Ambulatory Visit: Payer: Self-pay | Admitting: Internal Medicine

## 2014-08-30 ENCOUNTER — Other Ambulatory Visit: Payer: Self-pay | Admitting: Internal Medicine

## 2014-09-19 ENCOUNTER — Encounter: Payer: Self-pay | Admitting: *Deleted

## 2014-09-22 ENCOUNTER — Telehealth: Payer: Self-pay | Admitting: Internal Medicine

## 2014-09-22 NOTE — Telephone Encounter (Signed)
Call to patient to confirm appointment for 09/23/14 at 3:15 lmtcb

## 2014-09-23 ENCOUNTER — Encounter: Payer: Self-pay | Admitting: Internal Medicine

## 2014-09-23 ENCOUNTER — Ambulatory Visit (INDEPENDENT_AMBULATORY_CARE_PROVIDER_SITE_OTHER): Payer: Managed Care, Other (non HMO) | Admitting: Internal Medicine

## 2014-09-23 VITALS — BP 138/73 | HR 94 | Temp 97.8°F | Ht 62.0 in | Wt 144.5 lb

## 2014-09-23 DIAGNOSIS — I10 Essential (primary) hypertension: Secondary | ICD-10-CM | POA: Diagnosis not present

## 2014-09-23 DIAGNOSIS — E119 Type 2 diabetes mellitus without complications: Secondary | ICD-10-CM

## 2014-09-23 DIAGNOSIS — Z794 Long term (current) use of insulin: Secondary | ICD-10-CM

## 2014-09-23 LAB — POCT GLYCOSYLATED HEMOGLOBIN (HGB A1C): Hemoglobin A1C: 7.8

## 2014-09-23 LAB — GLUCOSE, CAPILLARY: GLUCOSE-CAPILLARY: 122 mg/dL — AB (ref 65–99)

## 2014-09-23 NOTE — Patient Instructions (Signed)
Please make every effort to increase your exercise and work on eating a low-carbohydrate (low sugar) diet between now and your next appointment.   If your A1c continues to stay at the same level (7.8%), we will need to increase your lantus dose then.  General Instructions:   Please bring your medicines with you each time you come to clinic.  Medicines may include prescription medications, over-the-counter medications, herbal remedies, eye drops, vitamins, or other pills.   Progress Toward Treatment Goals:  Treatment Goal 01/20/2014  Hemoglobin A1C unable to assess  Blood pressure unchanged    Self Care Goals & Plans:  Self Care Goal 09/23/2014  Manage my medications take my medicines as prescribed; bring my medications to every visit; refill my medications on time  Monitor my health bring my glucose meter and log to each visit; keep track of my blood glucose; keep track of my blood pressure  Eat healthy foods eat more vegetables; eat foods that are low in salt; eat baked foods instead of fried foods  Be physically active find an activity I enjoy  Other -  Meeting treatment goals -    Home Blood Glucose Monitoring 01/20/2014  Check my blood sugar once a day  When to check my blood sugar before breakfast     Care Management & Community Referrals:  Referral 11/04/2013  Referrals made for care management support none needed  Referrals made to community resources -

## 2014-09-25 NOTE — Assessment & Plan Note (Signed)
BP Readings from Last 3 Encounters:  09/23/14 138/73  01/20/14 167/95  11/04/13 166/81    Lab Results  Component Value Date   NA 135 06/13/2012   K 4.2 06/13/2012   CREATININE 0.72 06/13/2012    Assessment: Blood pressure control: good   Progress toward BP goal: at goal    Plan: Medications:  continue current medications Educational resources provided:   Self management tools provided:   Other plans:  - Continue use of quinapril and HCTZ, as these are providing adequate control - Continue to encourage exercise and healthy eating

## 2014-09-25 NOTE — Progress Notes (Signed)
Subjective:   Patient ID: Brooke Floyd female   DOB: 06-07-1965 49 y.o.   MRN: [id number]  HPI: [name] is a 49 y.o. woman with a history of hypertension, DM2, hyperlipidemia, asthma and GERD who presents for diabetes and hypertension follow up. She has been trying to start exercising and is proud of her work toward a low carbohydrate, low salt diet. Her husband was recently diagnosed with diabetes, and the two of them are trying to walk together and make good dietary choices. She has a diet made up primarily of fruits and vegetables. She has measured no low blood glucose levels and has suffered no symptoms of hypoglycemia. The highest blood glucose that she measured has been 160. She does not have her meter today.   Past Medical History  Diagnosis Date  . Unspecified asthma, with exacerbation   . Other symptoms involving nervous and musculoskeletal systems(781.99)   . Type II or unspecified type diabetes mellitus without mention of complication, not stated as uncontrolled     takes insulin  . GERD (gastroesophageal reflux disease)   . Hypertension    Current Outpatient Prescriptions  Medication Sig Dispense Refill  . albuterol (PROVENTIL HFA;VENTOLIN HFA) 108 (90 BASE) MCG/ACT inhaler Inhale 2 puffs into the lungs every 6 (six) hours as needed for wheezing or shortness of breath. 1 Inhaler 2  . aspirin 81 MG tablet Take 81 mg by mouth daily.     . Cholecalciferol (VITAMIN D-3) 5000 UNITS TABS Take 1 tablet by mouth daily.    . cyclobenzaprine (FLEXERIL) 10 MG tablet Take 1 tablet (10 mg total) by mouth 3 (three) times daily as needed for muscle spasms. 90 tablet 3  . cyclobenzaprine (FLEXERIL) 10 MG tablet TAKE 1 TABLET (10 MG TOTAL) BY MOUTH 3 (THREE) TIMES DAILY AS NEEDED FOR MUSCLE SPASMS. 30 tablet 1  . glipiZIDE (GLUCOTROL) 5 MG tablet Take 1 tablet (5 mg total) by mouth 2 (two) times daily before a meal. 180 tablet 3  . glipiZIDE (GLUCOTROL) 5 MG tablet TAKE 1  TABLET BY MOUTH TWICE DAILY 60 tablet 9  . hydrochlorothiazide (MICROZIDE) 12.5 MG capsule TAKE 1 CAPSULE BY MOUTH DAILY 30 capsule 3  . hydrochlorothiazide (MICROZIDE) 12.5 MG capsule TAKE 1 CAPSULE BY MOUTH DAILY 30 capsule 1  . hydrochlorothiazide (MICROZIDE) 12.5 MG capsule TAKE 1 CAPSULE BY MOUTH DAILY 30 capsule 1  . Insulin Glargine (LANTUS SOLOSTAR) 100 UNIT/ML Solostar Pen Inject 28 Units into the skin daily at 10 pm. 15 mL 5  . Insulin Pen Needle (PEN NEEDLES 3/16") 31G X 5 MM MISC Use once in the evening each day to inject insulin. diag code 250.00. Insulin dependent 100 each 4  . metFORMIN (GLUCOPHAGE) 1000 MG tablet TAKE 1 TABLET BY MOUTH TWICE DAILY WITH A MEAL 60 tablet 10  . mometasone-formoterol (DULERA) 100-5 MCG/ACT AERO Inhale 2 puffs into the lungs 2 (two) times daily. Rinse mouth 1 Inhaler 0  . montelukast (SINGULAIR) 10 MG tablet Take 1 tablet (10 mg total) by mouth at bedtime. 90 tablet 4  . omeprazole (PRILOSEC) 20 MG capsule Take 1 capsule (20 mg total) by mouth daily. 90 capsule 3  . quinapril (ACCUPRIL) 20 MG tablet Take 1 tablet (20 mg total) by mouth 2 (two) times daily. 60 tablet 3  . quinapril (ACCUPRIL) 20 MG tablet TAKE 2 TABLETS BY MOUTH DAILY 60 tablet 10  . simvastatin (ZOCOR) 80 MG tablet Take 1 tablet (80 mg total) by mouth at bedtime.  90 tablet 3  . simvastatin (ZOCOR) 80 MG tablet TAKE 1 TABLET BY MOUTH BEFORE BEDTIME 30 tablet 9   No current facility-administered medications for this visit.   Family History  Problem Relation Age of Onset  . Coronary artery disease Father    History   Social History  . Marital Status: Married    Spouse Name: N/A  . Number of Children: 2  . Years of Education: 14   Occupational History  . CNA    Social History Main Topics  . Smoking status: Never Smoker   . Smokeless tobacco: Not on file  . Alcohol Use: No  . Drug Use: No  . Sexual Activity: Not on file   Other Topics Concern  . None   Social History  Narrative   Review of Systems: General: no recent illness, good energy level Skin: no rashes or lesions HEENT: no headaches, no vision changes Cardiac: no chest pain or palpitations Respiratory: no shortness of breath GI: no changes Urinary: no changes Msk: no pain or swelling Psychiatric: no history  Objective:  Physical Exam: Filed Vitals:   09/23/14 1556  BP: 138/73  Pulse: 94  Temp: 97.8 F (36.6 C)  TempSrc: Oral  Height: 5\' 2"  (1.575 m)  Weight: 144 lb 8 oz (65.545 kg)  SpO2: 98%   Appearance: in NAD, in examining room with husband, wearing work clothes HEENT: AT/[place], PERRL, EOMi, no lymphadenopathy Heart: RRR, normal S1S2, no murmur Lungs: CTAB, no wheezes Abdomen: BS+, soft, nontender Musculoskeletal: normal range of motion, no edema Neurologic: A&Ox3, grossly intact Skin: no rashes or lesions, good distal pulses  Assessment & Plan:  Please see problem oriented charting for assessment & plan  Venita Lick, MD

## 2014-09-25 NOTE — Progress Notes (Signed)
Internal Medicine Clinic Attending  Case discussed with Dr. Mallory at the time of the visit.  We reviewed the resident's history and exam and pertinent patient test results.  I agree with the assessment, diagnosis, and plan of care documented in the resident's note. 

## 2014-09-25 NOTE — Assessment & Plan Note (Signed)
Lab Results  Component Value Date   HGBA1C 7.8 09/23/2014   HGBA1C 7.6 01/20/2014   HGBA1C 8.1 08/29/2013     Assessment: Diabetes control: stable   Progress toward A1C goal: stable but not at goal    Comments: Patient and her husband are working on improving their diet and exercise.  Plan: Medications:  continue current medications  Instruction/counseling given: reminded to bring blood glucose meter & log to each visit Other plans:  - Continue current medications; lantus 28 qHS, metformin 1000 BID and glipizide 5 mg BID - Patient to bring log to next appointment - Patient to continue to cut down on carbohydrates in her diet with her husband - Patient to increase walking outside as summer begins - Follow up with A1c in 3 months; patient told that if A1c is increasing, will likely need increase in her insulin regimen

## 2014-10-03 ENCOUNTER — Telehealth: Payer: Self-pay | Admitting: Internal Medicine

## 2014-10-03 NOTE — Telephone Encounter (Signed)
Lmtcb.

## 2014-10-07 NOTE — Telephone Encounter (Signed)
Pt has not been seen since 01/2013.   lmtcb X1 for pt.

## 2014-10-08 NOTE — Telephone Encounter (Signed)
lmtcb x3 for pt. 

## 2014-10-09 ENCOUNTER — Telehealth: Payer: Self-pay | Admitting: Internal Medicine

## 2014-10-09 DIAGNOSIS — J45901 Unspecified asthma with (acute) exacerbation: Secondary | ICD-10-CM

## 2014-10-09 NOTE — Telephone Encounter (Signed)
LMOM TCB x4 Explained to pt in VM that message will be closed per protocol and new message will be generated when she returns call.  Pt is also aware she is overdue for appt

## 2014-10-09 NOTE — Telephone Encounter (Signed)
Pt last seen 01/28/13. Overdue for appt and needs this to be scheduled.  Called pt and LMTCB x1

## 2014-10-10 NOTE — Telephone Encounter (Signed)
Will attempt to call patient back at 2:10pm.  Patient cannot be reached at any other time.

## 2014-10-13 MED ORDER — ALBUTEROL SULFATE HFA 108 (90 BASE) MCG/ACT IN AERS: 2.0000 | INHALATION_SPRAY | Freq: Four times a day (QID) | RESPIRATORY_TRACT | Status: DC | PRN

## 2014-10-13 MED ORDER — MOMETASONE FURO-FORMOTEROL FUM 100-5 MCG/ACT IN AERO: 2.0000 | INHALATION_SPRAY | Freq: Two times a day (BID) | RESPIRATORY_TRACT | Status: DC

## 2014-10-13 NOTE — Telephone Encounter (Signed)
Spoke with patient-she states she needs refill on rescue inhaler and Dulera. Pt is aware that I can send 1 month supply only and patient will need to be seen-appt set for Thursday 10-30-14 at 4:15pm. Nothing more needed at this time.

## 2014-10-24 ENCOUNTER — Other Ambulatory Visit: Payer: Self-pay | Admitting: Internal Medicine

## 2014-10-26 ENCOUNTER — Other Ambulatory Visit: Payer: Self-pay | Admitting: Internal Medicine

## 2014-10-27 NOTE — Telephone Encounter (Signed)
Brooke Floyd Albuterol inhaler filled 10/13/14 patient has appt 10/30/14. Is it ok to fill?

## 2014-10-30 ENCOUNTER — Encounter: Payer: Self-pay | Admitting: Internal Medicine

## 2014-10-30 ENCOUNTER — Ambulatory Visit (INDEPENDENT_AMBULATORY_CARE_PROVIDER_SITE_OTHER): Payer: Managed Care, Other (non HMO) | Admitting: Internal Medicine

## 2014-10-30 VITALS — BP 134/78 | HR 106 | Ht 62.0 in | Wt 140.0 lb

## 2014-10-30 DIAGNOSIS — J4520 Mild intermittent asthma, uncomplicated: Secondary | ICD-10-CM

## 2014-10-30 DIAGNOSIS — J45901 Unspecified asthma with (acute) exacerbation: Secondary | ICD-10-CM

## 2014-10-30 MED ORDER — MOMETASONE FURO-FORMOTEROL FUM 100-5 MCG/ACT IN AERO: 2.0000 | INHALATION_SPRAY | Freq: Two times a day (BID) | RESPIRATORY_TRACT | Status: DC

## 2014-10-30 MED ORDER — MONTELUKAST SODIUM 10 MG PO TABS: 10.0000 mg | ORAL_TABLET | Freq: Every day | ORAL | Status: DC

## 2014-10-30 MED ORDER — ALBUTEROL SULFATE HFA 108 (90 BASE) MCG/ACT IN AERS
2.0000 | INHALATION_SPRAY | Freq: Four times a day (QID) | RESPIRATORY_TRACT | Status: DC | PRN
Start: 2014-10-30 — End: 2015-09-29

## 2014-10-30 NOTE — Patient Instructions (Signed)
Scripts printed to refill singulair, Dulera and [employer]. We will see if there is financial assistance
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
## 2014-10-31 NOTE — Assessment & Plan Note (Signed)
Rare need for rescue inhaler. Current medicines are adequate. Plan-refilled continue present treatment

## 2014-11-18 ENCOUNTER — Other Ambulatory Visit: Payer: Self-pay | Admitting: Internal Medicine

## 2014-11-20 ENCOUNTER — Other Ambulatory Visit: Payer: Self-pay | Admitting: Internal Medicine

## 2014-12-15 ENCOUNTER — Other Ambulatory Visit: Payer: Self-pay | Admitting: Internal Medicine

## 2014-12-16 ENCOUNTER — Other Ambulatory Visit: Payer: Self-pay | Admitting: Internal Medicine

## 2014-12-20 ENCOUNTER — Other Ambulatory Visit: Payer: Self-pay | Admitting: Internal Medicine

## 2015-01-20 ENCOUNTER — Other Ambulatory Visit: Payer: Self-pay | Admitting: Internal Medicine

## 2015-04-16 ENCOUNTER — Other Ambulatory Visit: Payer: Self-pay

## 2015-05-14 ENCOUNTER — Other Ambulatory Visit: Payer: Self-pay | Admitting: Internal Medicine

## 2015-05-14 ENCOUNTER — Other Ambulatory Visit: Payer: Self-pay | Admitting: *Deleted

## 2015-05-14 MED ORDER — MONTELUKAST SODIUM 10 MG PO TABS: 10.0000 mg | ORAL_TABLET | Freq: Every day | ORAL | Status: DC

## 2015-05-14 MED ORDER — HYDROCHLOROTHIAZIDE 12.5 MG PO CAPS: 12.5000 mg | ORAL_CAPSULE | Freq: Every day | ORAL | Status: DC

## 2015-05-14 NOTE — Telephone Encounter (Signed)
duplicate

## 2015-05-14 NOTE — Telephone Encounter (Signed)
Brooke Floyd from [place] requesting hydrochlorothiazide and Singulair to be filled.

## 2015-05-15 NOTE — Telephone Encounter (Signed)
Rx called in to pharmacy. 

## 2015-05-22 ENCOUNTER — Ambulatory Visit: Payer: Managed Care, Other (non HMO)

## 2015-05-27 ENCOUNTER — Telehealth: Payer: Self-pay | Admitting: Internal Medicine

## 2015-05-27 NOTE — Telephone Encounter (Signed)
Pt requesting Lantus Vial to be filled @ [facility] for $10.00. Please call pt back.

## 2015-05-28 NOTE — Telephone Encounter (Signed)
i do not see LANTUS currently on her medlist, please advise

## 2015-05-29 NOTE — Telephone Encounter (Signed)
Patient needs an appointment as soon as possible. She was last seen in the clinic in 09/2014. Lantus was on her med list at that time but I noticed she is now on Novolog possibly prescribed by another provider (?) since 10/2014. I am not sure why Lantus was discontinued. It is best if patient returns to the clinic as soon as possible so that labs can be done. In addition, I would really appreciate it if you could ask the patient to bring all her meds and glucose meter with her to the visit. Thanks.

## 2015-06-01 NOTE — Telephone Encounter (Signed)
I think it is best for her to see someone now for her diabetes. I want to make sure her labs are done soon and that she is on the correct medication regimen.

## 2015-06-01 NOTE — Telephone Encounter (Signed)
Pt has April appt w/ dr Marlowe Sax, she would rather wait and see dr Marlowe Sax in April Does pt need to see someone else that is in clinic before April?

## 2015-06-04 NOTE — Telephone Encounter (Addendum)
Pt finally answered phone Gave her dr rathore's advisement, she stated she could not come in til feb appt given w/ dr Heber Altus 2/3 Ask how she was doing with diab, answered "fair", states cbg between 100 and 200, hard to stay on foods she should be eating, offered her assistance if possible, she said not now, thanks

## 2015-06-08 NOTE — Telephone Encounter (Signed)
Feb 3rd would be good. Thanks.

## 2015-06-12 ENCOUNTER — Ambulatory Visit: Payer: Managed Care, Other (non HMO) | Admitting: Internal Medicine

## 2015-06-19 ENCOUNTER — Telehealth: Payer: Self-pay | Admitting: *Deleted

## 2015-06-19 NOTE — Telephone Encounter (Signed)
rec'd request for refill of lantus, went back to previous request for lantus, called pt, attempted to schedule an appt before April, pt stated she would call back and schedule soon, tols her dr Marlowe Sax could not fill lantus til she came in for appt, she was agreeable

## 2015-06-24 NOTE — Telephone Encounter (Signed)
[facility] called checking on refill for cyclobenzaprine and lantus, advised pharmacy based on this note, pt would not receive any refills until she is seen in office- pt has apt 2/20

## 2015-06-26 ENCOUNTER — Telehealth: Payer: Self-pay | Admitting: Internal Medicine

## 2015-06-26 NOTE — Telephone Encounter (Signed)
APPT REMINDER CALL, LMTCB IF SHE NEEDS TO CANCEL °

## 2015-06-29 ENCOUNTER — Encounter: Payer: Self-pay | Admitting: Internal Medicine

## 2015-06-29 ENCOUNTER — Ambulatory Visit (INDEPENDENT_AMBULATORY_CARE_PROVIDER_SITE_OTHER): Payer: BLUE CROSS/BLUE SHIELD | Admitting: Internal Medicine

## 2015-06-29 VITALS — BP 148/73 | HR 95 | Temp 97.8°F | Ht 62.0 in | Wt 138.4 lb

## 2015-06-29 DIAGNOSIS — E1165 Type 2 diabetes mellitus with hyperglycemia: Secondary | ICD-10-CM | POA: Diagnosis not present

## 2015-06-29 DIAGNOSIS — R252 Cramp and spasm: Secondary | ICD-10-CM

## 2015-06-29 DIAGNOSIS — I10 Essential (primary) hypertension: Secondary | ICD-10-CM | POA: Diagnosis not present

## 2015-06-29 DIAGNOSIS — E119 Type 2 diabetes mellitus without complications: Secondary | ICD-10-CM

## 2015-06-29 DIAGNOSIS — Z794 Long term (current) use of insulin: Secondary | ICD-10-CM

## 2015-06-29 DIAGNOSIS — Z7984 Long term (current) use of oral hypoglycemic drugs: Secondary | ICD-10-CM | POA: Diagnosis not present

## 2015-06-29 DIAGNOSIS — Z79899 Other long term (current) drug therapy: Secondary | ICD-10-CM

## 2015-06-29 DIAGNOSIS — G2581 Restless legs syndrome: Secondary | ICD-10-CM | POA: Insufficient documentation

## 2015-06-29 LAB — GLUCOSE, CAPILLARY: Glucose-Capillary: 152 mg/dL — ABNORMAL HIGH (ref 65–99)

## 2015-06-29 LAB — POCT GLYCOSYLATED HEMOGLOBIN (HGB A1C): Hemoglobin A1C: 8.8

## 2015-06-29 MED ORDER — INSULIN GLARGINE 100 UNIT/ML ~~LOC~~ SOLN: 50.0000 [IU] | Freq: Every day | SUBCUTANEOUS | Status: DC

## 2015-06-29 MED ORDER — ROPINIROLE HCL 0.25 MG PO TABS: ORAL_TABLET | ORAL | Status: DC

## 2015-06-29 NOTE — Assessment & Plan Note (Signed)
HPI: She is taking her HCTZ and Quinapril daily  A: Essental HTN not at goal  P: Continue HCTZ and Quinapril, she is close to goal and I have encourage a low salt diet and increased exercise to get to goal. -Check BMP

## 2015-06-29 NOTE — Assessment & Plan Note (Addendum)
HPI:  She has been taking Glipizide 5mg  BID, Metformin 1g BID, and Novolin NPH 50units QHS for her diabetes.  She reports that her sugars have been higher lately and she would like to get back on Lantus which she had better control with.  She has not had any hypoglycemia.  A: Uncontrolled Type 2 DM without complication  P: - Continue Metformin 1g BID -Continue Glipizde 5mg  BID - Change insulin to Lantus 50units QHS -Return in 2-3 months with meter for review - Optometry referral for eye exam

## 2015-06-29 NOTE — Patient Instructions (Addendum)
General Instructions:  I want you to look into Victoza, Byetta, or Trulicity  Please bring your medicines with you each time you come to clinic.  Medicines may include prescription medications, over-the-counter medications, herbal remedies, eye drops, vitamins, or other pills.   Progress Toward Treatment Goals:  Treatment Goal 01/20/2014  Hemoglobin A1C unable to assess  Blood pressure unchanged    Self Care Goals & Plans:  Self Care Goal 06/29/2015  Manage my medications take my medicines as prescribed; bring my medications to every visit; refill my medications on time  Monitor my health -  Eat healthy foods drink diet soda or water instead of juice or soda; eat foods that are low in salt; eat more vegetables; eat baked foods instead of fried foods  Be physically active find an activity I enjoy  Meeting treatment goals -    Home Blood Glucose Monitoring 06/29/2015  Check my blood sugar no home glucose monitoring  When to check my blood sugar -     Care Management & Community Referrals:  Referral 11/04/2013  Referrals made for care management support none needed    Restless Legs Syndrome Restless legs syndrome is a condition that causes uncomfortable feelings or sensations in the legs, especially while sitting or lying down. The sensations usually cause an overwhelming urge to move the legs. The arms can also sometimes be affected. The condition can range from mild to severe. The symptoms often interfere with a person's ability to sleep. CAUSES The cause of this condition is not known. RISK FACTORS This condition is more likely to develop in:  People who are older than age 69.  Pregnant women. In general, restless legs syndrome is more common in women than in men.  People who have a family history of the condition.  People who have certain medical conditions, such as iron deficiency, kidney disease, Parkinson disease, or nerve damage.  People who take certain medicines,  such as medicines for high blood pressure, nausea, colds, allergies, depression, and some heart conditions. SYMPTOMS The main symptom of this condition is uncomfortable sensations in the legs. These sensations may be:  Described as pulling, tingling, prickling, throbbing, crawling, or burning.  Worse while you are sitting or lying down.  Worse during periods of rest or inactivity.  Worse at night, often interfering with your sleep.  Accompanied by a very strong urge to move your legs.  Temporarily relieved by movement of your legs. The sensations usually affect both sides of the body. The arms can also be affected, but this is rare. People who have this condition often have tiredness during the day because of their lack of sleep at night. DIAGNOSIS This condition may be diagnosed based on your description of the symptoms. You may also have tests, including blood tests, to check for other conditions that may lead to your symptoms. In some cases, you may be asked to spend some time in a sleep lab so your sleeping can be monitored. TREATMENT Treatment for this condition is focused on managing the symptoms. Treatment may include:  Self-help and lifestyle changes.  Medicines. HOME CARE INSTRUCTIONS  Take medicines only as directed by your health care provider.  Try these methods to get temporary relief from the uncomfortable sensations:  Massage your legs.  Walk or stretch.  Take a cold or hot bath.  Practice good sleep habits. For example, go to bed and get up at the same time every day.  Exercise regularly.  Practice ways of relaxing, such  as yoga or meditation.  Avoid caffeine and alcohol.  Do not use any tobacco products, including cigarettes, chewing tobacco, or electronic cigarettes. If you need help quitting, ask your health care provider.  Keep all follow-up visits as directed by your health care provider. This is important. SEEK MEDICAL CARE IF: Your symptoms do  not improve with treatment, or they get worse.   This information is not intended to replace advice given to you by your health care provider. Make sure you discuss any questions you have with your health care provider.   Document Released: 04/15/2002 Document Revised: 09/09/2014 Document Reviewed: 04/21/2014 Elsevier Interactive Patient Education [employer].

## 2015-06-29 NOTE — Assessment & Plan Note (Signed)
HPI: She would like a change from flexeril to another medciation for her cramping of her lower extremties.  She reports flexeril was initially somewhat helpful but now no longer helps. She describes the bain as achey and "ache like a toothpick" she reports that it sometimes happens while walking but mostly at night when resting or durning the day when she is not moving her legs.  She denies any numbness or tingling of her feet.  She does have some insomnia.  She has also tried Zanaflex from a friend which did not help.  A: Cramp of bilateral lower extremities possible Restless leg syndrome  P: She is overdue for routinue labwork, check BMP to r/o hypokalemia as cause Overall I am concerned for RLS and do not feel that muscle relaxants are the right choice of medication,  She is adamant that I prescribe something today so I will go with that working diagnosis. - Start Requip 0.25mg  QHS, may increase in 2-3 days to 0.5mg , we can titrate this up to 1mg  QHS if needed. -IF this fails would recommend trial of B-Complex.

## 2015-06-29 NOTE — Progress Notes (Signed)
Brooke Floyd Subjective:   Patient ID: Brooke Floyd female   DOB: Sep 03, 1965 50 y.o.   MRN: [id number]  HPI: Brooke Floyd is a 50 y.o. female with a PMH detailed below who presents for overdue follow up DM and HTN.   Please see problem based charting below for the status of her chronic medical problems.    Past Medical History  Diagnosis Date  . Unspecified asthma, with exacerbation   . Other symptoms involving nervous and musculoskeletal systems(781.99)   . Type II or unspecified type diabetes mellitus without mention of complication, not stated as uncontrolled     takes insulin  . GERD (gastroesophageal reflux disease)   . Hypertension    Current Outpatient Prescriptions  Medication Sig Dispense Refill  . albuterol (PROVENTIL HFA;VENTOLIN HFA) 108 (90 BASE) MCG/ACT inhaler Inhale 2 puffs into the lungs every 6 (six) hours as needed for wheezing or shortness of breath. 1 Inhaler 11  . aspirin 81 MG tablet Take 81 mg by mouth daily.     . Cholecalciferol (VITAMIN D-3) 5000 UNITS TABS Take 1 tablet by mouth daily.    . cyclobenzaprine (FLEXERIL) 10 MG tablet Take 1 tablet (10 mg total) by mouth 3 (three) times daily as needed for muscle spasms. 90 tablet 3  . hydrochlorothiazide (MICROZIDE) 12.5 MG capsule Take 1 capsule (12.5 mg total) by mouth daily. 30 capsule 2  . insulin aspart protamine- aspart (NOVOLOG MIX 70/30) (70-30) 100 UNIT/ML injection Inject 50 Units into the skin daily with supper.    . metFORMIN (GLUCOPHAGE) 1000 MG tablet TAKE 1 TABLET BY MOUTH TWICE DAILY WITH A MEAL 60 tablet 9  . mometasone-formoterol (DULERA) 100-5 MCG/ACT AERO Inhale 2 puffs into the lungs 2 (two) times daily. Rinse mouth 1 Inhaler 11  . montelukast (SINGULAIR) 10 MG tablet Take 1 tablet (10 mg total) by mouth at bedtime. 30 tablet 2  . quinapril (ACCUPRIL) 20 MG tablet Take 1 tablet (20 mg total) by mouth 2 (two) times daily. 60 tablet 3  . quinapril  (ACCUPRIL) 20 MG tablet TAKE 2 TABLETS BY MOUTH DAILY 60 tablet 9  . simvastatin (ZOCOR) 80 MG tablet TAKE 1 TABLET BY MOUTH BEFORE BEDTIME 30 tablet 9   No current facility-administered medications for this visit.   Family History  Problem Relation Age of Onset  . Coronary artery disease Father    Social History   Social History  . Marital Status: Married    Spouse Name: N/A  . Number of Children: 2  . Years of Education: 14   Occupational History  . CNA    Social History Main Topics  . Smoking status: Never Smoker   . Smokeless tobacco: Not on file  . Alcohol Use: No  . Drug Use: No  . Sexual Activity: Not on file   Other Topics Concern  . Not on file   Social History Narrative   Review of Systems: Review of Systems  Constitutional: Negative for fever and chills.  Respiratory: Negative for cough.   Cardiovascular: Negative for chest pain.  Gastrointestinal: Negative for heartburn and abdominal pain.  Genitourinary: Negative for frequency.  Neurological: Negative for dizziness, tingling, sensory change and headaches.  Endo/Heme/Allergies: Negative for polydipsia.     Objective:  Physical Exam: Filed Vitals:   06/29/15 1609  BP: 148/73  Pulse: 95  Temp: 97.8 F (36.6 C)  TempSrc: Oral  Height: 5\' 2"  (1.575 m)  Weight: 138 lb 6.4 oz (62.778 kg)  SpO2: 100%  Physical Exam  Constitutional: She is well-developed, well-nourished, and in no distress.  Cardiovascular: Normal rate and regular rhythm.   Pulses:      Dorsalis pedis pulses are 2+ on the right side, and 2+ on the left side.       Posterior tibial pulses are 2+ on the right side, and 2+ on the left side.  Pulmonary/Chest: Effort normal and breath sounds normal.  Abdominal: Soft. Bowel sounds are normal.  Neurological:  Normal monofilament exam of bilateral feet  Skin:  Skin intact of bilateral feet without ulceration or callus  Nursing note and vitals reviewed.   Assessment & Plan:  Case  discussed with Dr. Daryll Drown  Diabetes mellitus without complication HPI:  She has been taking Glipizide 5mg  BID, Metformin 1g BID, and Novolin NPH 50units QHS for her diabetes.  She reports that her sugars have been higher lately and she would like to get back on Lantus which she had better control with.  She has not had any hypoglycemia.  A: Uncontrolled Type 2 DM without complication  P: - Continue Metformin 1g BID -Continue Glipizde 5mg  BID - Change insulin to Lantus 50units QHS -Return in 2-3 months with meter for review - Optometry referral for eye exam  Cramp of both lower extremities HPI: She would like a change from flexeril to another medciation for her cramping of her lower extremties.  She reports flexeril was initially somewhat helpful but now no longer helps. She describes the bain as achey and "ache like a toothpick" she reports that it sometimes happens while walking but mostly at night when resting or durning the day when she is not moving her legs.  She denies any numbness or tingling of her feet.  She does have some insomnia.  She has also tried Zanaflex from a friend which did not help.  A: Cramp of bilateral lower extremities possible Restless leg syndrome  P: She is overdue for routinue labwork, check BMP to r/o hypokalemia as cause Overall I am concerned for RLS and do not feel that muscle relaxants are the right choice of medication,  She is adamant that I prescribe something today so I will go with that working diagnosis. - Start Requip 0.25mg  QHS, may increase in 2-3 days to 0.5mg , we can titrate this up to 1mg  QHS if needed. -IF this fails would recommend trial of B-Complex.  HTN (hypertension) HPI: She is taking her HCTZ and Quinapril daily  A: Essental HTN not at goal  P: Continue HCTZ and Quinapril, she is close to goal and I have encourage a low salt diet and increased exercise to get to goal. -Check BMP    Medications Ordered Meds ordered this  encounter  Medications  . glipiZIDE (GLUCOTROL) 5 MG tablet    Sig:     Refill:  12  . insulin glargine (LANTUS) 100 UNIT/ML injection    Sig: Inject 0.5 mLs (50 Units total) into the skin at bedtime.    Dispense:  10 mL    Refill:  2  . rOPINIRole (REQUIP) 0.25 MG tablet    Sig: Take 0.25mg  QHS if not improved may increase to 0.5mg  QHS.    Dispense:  60 tablet    Refill:  2   Other Orders Orders Placed This Encounter  Procedures  . Glucose, capillary  . BMP8+Anion Gap  . CBC with Diff  . Vitamin D (25 hydroxy)  . Ambulatory referral to Optometry    Referral Priority:  Routine    Referral Type:  [facility] planner)    Referral Reason:  Specialty Services Required    Requested Specialty:  Optometry    Number of Visits Requested:  1  . POC Hbg A1C   Follow Up: Return 2-3 months.

## 2015-06-30 ENCOUNTER — Telehealth: Payer: Self-pay | Admitting: Internal Medicine

## 2015-06-30 LAB — CBC WITH DIFFERENTIAL/PLATELET
BASOS ABS: 0 10*3/uL (ref 0.0–0.2)
Basos: 0 %
EOS (ABSOLUTE): 0.1 10*3/uL (ref 0.0–0.4)
EOS: 1 %
HEMATOCRIT: 34.8 % (ref 34.0–46.6)
HEMOGLOBIN: 11.2 g/dL (ref 11.1–15.9)
IMMATURE GRANULOCYTES: 0 %
Immature Grans (Abs): 0 10*3/uL (ref 0.0–0.1)
Lymphocytes Absolute: 1.8 10*3/uL (ref 0.7–3.1)
Lymphs: 30 %
MCH: 29.4 pg (ref 26.6–33.0)
MCHC: 32.2 g/dL (ref 31.5–35.7)
MCV: 91 fL (ref 79–97)
MONOCYTES: 10 %
Monocytes Absolute: 0.6 10*3/uL (ref 0.1–0.9)
NEUTROS PCT: 59 %
Neutrophils Absolute: 3.6 10*3/uL (ref 1.4–7.0)
Platelets: 560 10*3/uL — ABNORMAL HIGH (ref 150–379)
RBC: 3.81 x10E6/uL (ref 3.77–5.28)
RDW: 13 % (ref 12.3–15.4)
WBC: 6.2 10*3/uL (ref 3.4–10.8)

## 2015-06-30 LAB — BMP8+ANION GAP
ANION GAP: 22 mmol/L — AB (ref 10.0–18.0)
BUN/Creatinine Ratio: 20 (ref 9–23)
BUN: 12 mg/dL (ref 6–24)
CALCIUM: 10.2 mg/dL (ref 8.7–10.2)
CO2: 20 mmol/L (ref 18–29)
CREATININE: 0.59 mg/dL (ref 0.57–1.00)
Chloride: 101 mmol/L (ref 96–106)
GFR calc Af Amer: 124 mL/min/{1.73_m2} (ref >59)
GFR calc non Af Amer: 107 mL/min/{1.73_m2} (ref >59)
Glucose: 147 mg/dL — ABNORMAL HIGH (ref 65–99)
Potassium: 4.7 mmol/L (ref 3.5–5.2)
Sodium: 143 mmol/L (ref 134–144)

## 2015-06-30 LAB — VITAMIN D 25 HYDROXY (VIT D DEFICIENCY, FRACTURES): Vit D, 25-Hydroxy: 78.1 ng/mL (ref 30.0–100.0)

## 2015-06-30 NOTE — Progress Notes (Signed)
Internal Medicine Clinic Attending  Case discussed with Dr. Hoffman at the time of the visit.  We reviewed the resident's history and exam and pertinent patient test results.  I agree with the assessment, diagnosis, and plan of care documented in the resident's note.  

## 2015-06-30 NOTE — Telephone Encounter (Signed)
I called her and talked with her, I recommended to try to give it a few more days.  She will call back on Thursday.

## 2015-06-30 NOTE — Telephone Encounter (Signed)
Pt calls and states she was up all night, kept her husband up, she states she has got to get some rest this is causing family distress

## 2015-06-30 NOTE — Telephone Encounter (Signed)
Pt states she was given medication fo restless leg yesterday but it has not helped so far. States she could not sleep at all last night.

## 2015-07-07 ENCOUNTER — Telehealth: Payer: Self-pay | Admitting: Internal Medicine

## 2015-07-07 DIAGNOSIS — G2581 Restless legs syndrome: Secondary | ICD-10-CM

## 2015-07-07 MED ORDER — ROPINIROLE HCL 1 MG PO TABS: 1.0000 mg | ORAL_TABLET | Freq: Every day | ORAL | Status: DC

## 2015-07-07 NOTE — Telephone Encounter (Signed)
Pt states she had to take four pill a day for restless leg and it is much better than before. Requesting the nurse to call back.

## 2015-07-07 NOTE — Assessment & Plan Note (Signed)
A: Restless leg syndrome  P: I spoke to her by phone, It sounds like her leg cramps were due to RLS and she is doing much better on 1mg  of Requip QHS - Update Rx to 1mg  QHS

## 2015-07-07 NOTE — Telephone Encounter (Signed)
Spoke with patient, she reports taking 1mg  of the requip on the advise of Dr. Heber Naches.  She says this is helping her, but she is also still taking 2 benadryl. If you are OK with her taking 1mg  daily you may want to update the rx so that she doesn't run out and not have a supply.

## 2015-07-13 ENCOUNTER — Other Ambulatory Visit: Payer: Self-pay | Admitting: Internal Medicine

## 2015-07-16 ENCOUNTER — Other Ambulatory Visit: Payer: Self-pay | Admitting: Internal Medicine

## 2015-07-16 MED ORDER — SIMVASTATIN 80 MG PO TABS: ORAL_TABLET | ORAL | Status: DC

## 2015-07-16 NOTE — Telephone Encounter (Addendum)
Pt needs refill for simvastatin (ZOCOR) 80 MG tablet [facility].

## 2015-08-05 ENCOUNTER — Other Ambulatory Visit: Payer: Self-pay

## 2015-08-05 DIAGNOSIS — Z1231 Encounter for screening mammogram for malignant neoplasm of breast: Secondary | ICD-10-CM

## 2015-08-08 ENCOUNTER — Other Ambulatory Visit: Payer: Self-pay | Admitting: Internal Medicine

## 2015-08-12 ENCOUNTER — Ambulatory Visit
Admission: RE | Admit: 2015-08-12 | Discharge: 2015-08-12 | Disposition: A | Discharge status: Home / Self Care | Payer: BLUE CROSS/BLUE SHIELD | Source: Ambulatory Visit

## 2015-08-12 DIAGNOSIS — Z1231 Encounter for screening mammogram for malignant neoplasm of breast: Secondary | ICD-10-CM

## 2015-08-17 ENCOUNTER — Telehealth: Payer: Self-pay | Admitting: Dietician

## 2015-08-17 NOTE — Telephone Encounter (Signed)
Called patient as part of project trying to help patient's with a1C between 8-9% lower their blood sugars to target She is mostly concerned with her restless legs, she reports her blood sugars are 100-200 fasting,  one morning it was 59, sick in the morning from the new medicine, so not eating well.  She needs a meter, test strips and lancets. Suggest she call her insurance to find out preferred meter and pharmacy.

## 2015-08-18 ENCOUNTER — Ambulatory Visit (INDEPENDENT_AMBULATORY_CARE_PROVIDER_SITE_OTHER): Payer: BLUE CROSS/BLUE SHIELD | Admitting: Internal Medicine

## 2015-08-18 ENCOUNTER — Telehealth: Payer: Self-pay | Admitting: Licensed Clinical Social Worker

## 2015-08-18 ENCOUNTER — Other Ambulatory Visit: Payer: Self-pay | Admitting: Internal Medicine

## 2015-08-18 ENCOUNTER — Encounter: Payer: Self-pay | Admitting: Internal Medicine

## 2015-08-18 VITALS — BP 137/72 | HR 95 | Temp 98.4°F | Ht 62.0 in | Wt 140.0 lb

## 2015-08-18 DIAGNOSIS — F329 Major depressive disorder, single episode, unspecified: Secondary | ICD-10-CM

## 2015-08-18 DIAGNOSIS — R519 Headache, unspecified: Secondary | ICD-10-CM

## 2015-08-18 DIAGNOSIS — F32A Depression, unspecified: Secondary | ICD-10-CM

## 2015-08-18 DIAGNOSIS — G2581 Restless legs syndrome: Secondary | ICD-10-CM | POA: Diagnosis not present

## 2015-08-18 DIAGNOSIS — I10 Essential (primary) hypertension: Secondary | ICD-10-CM | POA: Diagnosis not present

## 2015-08-18 DIAGNOSIS — R51 Headache: Secondary | ICD-10-CM

## 2015-08-18 MED ORDER — AMITRIPTYLINE HCL 50 MG PO TABS
50.0000 mg | ORAL_TABLET | Freq: Every day | ORAL | Status: DC
Start: 2015-08-18 — End: 2015-09-12

## 2015-08-18 NOTE — Assessment & Plan Note (Signed)
States she has a HA daily that describes as tightness around the top of her head.  Also reports phonophobia, photophobia.  No N/V.  No associated vision changes.  She reports a lot stress in her life and would like to leave her husband.  States she wants to "leave and not come back."  Denies any SI.   -will refer to [facility]

## 2015-08-18 NOTE — Progress Notes (Signed)
Patient ID: TAMAKIA HOLTMEYER, female   DOB: 1966-03-26, 50 y.o.   MRN: [id number]     Subjective:   Patient ID: TYKIA PASSALACQUA female    DOB: December 29, 1965 50 y.o.    MRN: [id number] Health Maintenance Due: Health Maintenance Due  Topic Date Due  . HIV Screening  05/20/1980  . TETANUS/TDAP  05/20/1984  . OPHTHALMOLOGY EXAM  02/18/2014  . LIPID PANEL  08/30/2014  . COLONOSCOPY  05/21/2015    _________________________________________________  HPI: Ms.Addy TRIVIA PASSALAQUA is a 50 y.o. female here for an acute visit for depression, headaches, and RLS.  Pt has a PMH outlined below.  Please see problem-based charting assessment and plan for further status of patient's chronic medical problems addressed at today's visit.  PMH: Past Medical History  Diagnosis Date  . Unspecified asthma, with exacerbation   . Other symptoms involving nervous and musculoskeletal systems(781.99)   . Type II or unspecified type diabetes mellitus without mention of complication, not stated as uncontrolled     takes insulin  . GERD (gastroesophageal reflux disease)   . Hypertension     Medications: Current Outpatient Prescriptions on File Prior to Visit  Medication Sig Dispense Refill  . albuterol (PROVENTIL HFA;VENTOLIN HFA) 108 (90 BASE) MCG/ACT inhaler Inhale 2 puffs into the lungs every 6 (six) hours as needed for wheezing or shortness of breath. 1 Inhaler 11  . aspirin 81 MG tablet Take 81 mg by mouth daily.     . Cholecalciferol (VITAMIN D-3) 5000 UNITS TABS Take 1 tablet by mouth daily.    Marland Kitchen glipiZIDE (GLUCOTROL) 5 MG tablet   12  . hydrochlorothiazide (MICROZIDE) 12.5 MG capsule TAKE 1 CAPSULE BY MOUTH DAILY 30 capsule 1  . LANTUS 100 UNIT/ML injection INJECT 0.5 MLS (50 UNITS TOTAL) INTO THE SKIN AT BEDTIME. 10 mL 1  . metFORMIN (GLUCOPHAGE) 1000 MG tablet TAKE 1 TABLET BY MOUTH TWICE DAILY WITH A MEAL 60 tablet 9  . mometasone-formoterol (DULERA) 100-5 MCG/ACT AERO Inhale 2 puffs into the lungs 2  (two) times daily. Rinse mouth 1 Inhaler 11  . montelukast (SINGULAIR) 10 MG tablet TAKE 1 TABLET BY MOUTH EVERY NIGHT AT BEDTIME 30 tablet 1  . quinapril (ACCUPRIL) 20 MG tablet TAKE 2 TABLETS BY MOUTH DAILY 60 tablet 9  . simvastatin (ZOCOR) 80 MG tablet TAKE 1 TABLET BY MOUTH BEFORE BEDTIME 30 tablet 9   No current facility-administered medications on file prior to visit.    Allergies: Allergies  Allergen Reactions  . Celexa [Citalopram Hydrobromide] Other (See Comments)    Low back pain, increased libido.   . Diclofenac Sodium Itching  . Penicillins Other (See Comments)    convulsions    FH: Family History  Problem Relation Age of Onset  . Coronary artery disease Father     SH: Social History   Social History  . Marital Status: Married    Spouse Name: N/A  . Number of Children: 2  . Years of Education: 14   Occupational History  . CNA    Social History Main Topics  . Smoking status: Never Smoker   . Smokeless tobacco: None  . Alcohol Use: No  . Drug Use: No  . Sexual Activity: Not Asked   Other Topics Concern  . None   Social History Narrative    Review of Systems: Constitutional: Negative for fever, chills. Eyes: Negative blurred vision.  Respiratory: Negative for cough and +shortness of breath.  Cardiovascular: +chest pain. Gastrointestinal: +nausea, vomiting. Neurological: +  headaches.     Objective:   Vital Signs: Filed Vitals:   08/18/15 0902  BP: 137/72  Pulse: 95  Temp: 98.4 F (36.9 C)  TempSrc: Oral  Height: 5\' 2"  (1.575 m)  Weight: 140 lb (63.504 kg)  SpO2: 98%     BP Readings from Last 3 Encounters:  08/18/15 137/72  06/29/15 148/73  10/30/14 134/78    Physical Exam: Constitutional: Vital signs reviewed.  Patient is in NAD and cooperative with exam.  Head: Normocephalic and atraumatic. Eyes: EOMI, conjunctivae nl, no scleral icterus.  Neck: Supple. Cardiovascular: RRR, no MRG. Pulmonary/Chest: normal effort, CTAB, no  wheezes, rales, or rhonchi. Neurological: A&O x3, cranial nerves II-XII are grossly intact, moving all extremities. Extremities: No LE edema. Skin: Warm, dry and intact.   Assessment & Plan:   Assessment and plan was discussed and formulated with my attending.

## 2015-08-18 NOTE — Telephone Encounter (Signed)
Ms. Comes returned call to [facility].  Pt hesitant to initiate behavioral health services.  CSW provided additional information regarding benefits of therapy.  Pt has not had behavioral health services in the past, stating "one time, maybe".  Ms. Basco has no preference of provider other than female provider.  Pt would like to initiate services prior to returning to work on 4/24.  Pt agreeable for CSW to utilize [employer] and complete referral and schedule if possible.  CSW sent inquiry to provider regarding availability, awaiting response.

## 2015-08-18 NOTE — Assessment & Plan Note (Addendum)
She stopped the requip because she stated it was causing her some nausea.  Likely exacerbated by depression.   -d/c requip  -will resume elavil

## 2015-08-18 NOTE — Assessment & Plan Note (Addendum)
Pt reports a lot of stress in her life and wishes she would "leave and not come back."  Denies SI.  She has not been sleeping or eating well.  She feels fatigued without energy.  She tells her husband that sometimes she "wishes she were dead" to be with her mother and her brother.   -referral to [facility]  -begin elavil 50mg  qhs  -return in 4-6 weeks

## 2015-08-18 NOTE — Telephone Encounter (Signed)
Ms. Bromberg was referred to CSW for psychotherapy referral.  Pt is covered by [facility].  CSW utilized [employer] to obtain [employer].  CSW placed called to pt.  CSW left message requesting return call. CSW provided contact hours and phone number.

## 2015-08-18 NOTE — Telephone Encounter (Signed)
Pt has add'l appt's scheduled: 4/17 and 4/21.

## 2015-08-18 NOTE — Patient Instructions (Signed)
Thank you for your visit today.   Please return to the internal medicine clinic in May or sooner if needed.     I have made the following additions/changes to your medications:  I have sent elavil to your pharmacy, please take this at bedtime.    I have made the following referrals for you: I will refer you to social work.    Please be sure to bring all of your medications with you to every visit; this includes herbal supplements, vitamins, eye drops, and any over-the-counter medications.   Should you have any questions regarding your medications and/or any new or worsening symptoms, please be sure to call the clinic at [phone].   If you believe that you are suffering from a life threatening condition or one that may result in the loss of limb or function, then you should call 911 and proceed to the nearest Emergency Department.   A healthy lifestyle and preventative care can promote health and wellness.   Maintain regular health, dental, and eye exams.  Eat a healthy diet. Foods like vegetables, fruits, whole grains, low-fat dairy products, and lean protein foods contain the nutrients you need without too many calories. Decrease your intake of foods high in solid fats, added sugars, and salt. Get information about a proper diet from your caregiver, if necessary.  Regular physical exercise is one of the most important things you can do for your health. Most adults should get at least 150 minutes of moderate-intensity exercise (any activity that increases your heart rate and causes you to sweat) each week. In addition, most adults need muscle-strengthening exercises on 2 or more days a week.   Maintain a healthy weight. The body mass index (BMI) is a screening tool to identify possible weight problems. It provides an estimate of body fat based on height and weight. Your caregiver can help determine your BMI, and can help you achieve or maintain a healthy weight. For adults 20 years and  older:  A BMI below 18.5 is considered underweight.  A BMI of 18.5 to 24.9 is normal.  A BMI of 25 to 29.9 is considered overweight.  A BMI of 30 and above is considered obese.   Major Depressive Disorder Major depressive disorder is a mental illness. It also may be called clinical depression or unipolar depression. Major depressive disorder usually causes feelings of sadness, hopelessness, or helplessness. Some people with this disorder do not feel particularly sad but lose interest in doing things they used to enjoy (anhedonia). Major depressive disorder also can cause physical symptoms. It can interfere with work, school, relationships, and other normal everyday activities. The disorder varies in severity but is longer lasting and more serious than the sadness we all feel from time to time in our lives. Major depressive disorder often is triggered by stressful life events or major life changes. Examples of these triggers include divorce, loss of your job or home, a move, and the death of a family member or close friend. Sometimes this disorder occurs for no obvious reason at all. People who have family members with major depressive disorder or bipolar disorder are at higher risk for developing this disorder, with or without life stressors. Major depressive disorder can occur at any age. It may occur just once in your life (single episode major depressive disorder). It may occur multiple times (recurrent major depressive disorder). SYMPTOMS People with major depressive disorder have either anhedonia or depressed mood on nearly a daily basis for at least  2 weeks or longer. Symptoms of depressed mood include:  Feelings of sadness (blue or down in the dumps) or emptiness.  Feelings of hopelessness or helplessness.  Tearfulness or episodes of crying (may be observed by others).  Irritability (children and adolescents). In addition to depressed mood or anhedonia or both, people with this  disorder have at least four of the following symptoms:  Difficulty sleeping or sleeping too much.   Significant change (increase or decrease) in appetite or weight.   Lack of energy or motivation.  Feelings of guilt and worthlessness.   Difficulty concentrating, remembering, or making decisions.  Unusually slow movement (psychomotor retardation) or restlessness (as observed by others).   Recurrent wishes for death, recurrent thoughts of self-harm (suicide), or a suicide attempt. People with major depressive disorder commonly have persistent negative thoughts about themselves, other people, and the world. People with severe major depressive disorder may experiencedistorted beliefs or perceptions about the world (psychotic delusions). They also may see or hear things that are not real (psychotic hallucinations). DIAGNOSIS Major depressive disorder is diagnosed through an assessment by your health care provider. Your health care provider will ask aboutaspects of your daily life, such as mood,sleep, and appetite, to see if you have the diagnostic symptoms of major depressive disorder. Your health care provider may ask about your medical history and use of alcohol or drugs, including prescription medicines. Your health care provider also may do a physical exam and blood work. This is because certain medical conditions and the use of certain substances can cause major depressive disorder-like symptoms (secondary depression). Your health care provider also may refer you to a mental health specialist for further evaluation and treatment. TREATMENT It is important to recognize the symptoms of major depressive disorder and seek treatment. The following treatments can be prescribed for this disorder:   Medicine. Antidepressant medicines usually are prescribed. Antidepressant medicines are thought to correct chemical imbalances in the brain that are commonly associated with major depressive  disorder. Other types of medicine may be added if the symptoms do not respond to antidepressant medicines alone or if psychotic delusions or hallucinations occur.  Talk therapy. Talk therapy can be helpful in treating major depressive disorder by providing support, education, and guidance. Certain types of talk therapy also can help with negative thinking (cognitive behavioral therapy) and with relationship issues that trigger this disorder (interpersonal therapy). A mental health specialist can help determine which treatment is best for you. Most people with major depressive disorder do well with a combination of medicine and talk therapy. Treatments involving electrical stimulation of the brain can be used in situations with extremely severe symptoms or when medicine and talk therapy do not work over time. These treatments include electroconvulsive therapy, transcranial magnetic stimulation, and vagal nerve stimulation.   This information is not intended to replace advice given to you by your health care provider. Make sure you discuss any questions you have with your health care provider.   Document Released: 08/20/2012 Document Revised: 05/16/2014 Document Reviewed: 08/20/2012 Elsevier Interactive Patient Education [employer].

## 2015-08-18 NOTE — Assessment & Plan Note (Signed)
BP under control 132/72. -cont current meds

## 2015-08-19 NOTE — Progress Notes (Signed)
Case discussed with Dr. Gill soon after the resident saw the patient.  We reviewed the resident's history and exam and pertinent patient test results.  I agree with the assessment, diagnosis, and plan of care documented in the resident's note. 

## 2015-08-24 NOTE — Telephone Encounter (Signed)
No response back from first agency referral regarding network status.  CSW placed call to [facility] to inquire if provider remains in-network and if pt could be scheduled prior to 08/31/15.  Referral request left on voicemail requesting return call or call to patient for scheduling.

## 2015-08-27 NOTE — Telephone Encounter (Signed)
CSW placed call to Ms. Wollen to follow up on referral to [facility].  Pt states she has not received call from [facility] for scheduling.  CSW inquired if pt would like referral sent to [facility] as they are accepting new clients.  Ms. Prinzo states "I don't think I need counseling.".  Pt declines further referrals to be sent for behavioral health services.  CSW encouraged Ms. Mackins to contact [facility] should she desire counseling services.  Letter mailed with open access resources.

## 2015-09-02 ENCOUNTER — Other Ambulatory Visit: Payer: Self-pay | Admitting: *Deleted

## 2015-09-02 MED ORDER — GLIPIZIDE 5 MG PO TABS: 5.0000 mg | ORAL_TABLET | Freq: Two times a day (BID) | ORAL | Status: DC

## 2015-09-02 NOTE — Telephone Encounter (Signed)
Received faxed refill request from pt's pharmacy for her glipizide 5mg  tabs take one by mouth twice daily.  Pharmacy requesting new rx.  Will forward to pcp for review. Please advise.Despina Hidden Cassady4/26/201712:42 PM

## 2015-09-03 ENCOUNTER — Other Ambulatory Visit: Payer: Self-pay

## 2015-09-03 ENCOUNTER — Encounter: Payer: BLUE CROSS/BLUE SHIELD | Admitting: Dietician

## 2015-09-03 ENCOUNTER — Encounter: Payer: Managed Care, Other (non HMO) | Admitting: Internal Medicine

## 2015-09-03 NOTE — Telephone Encounter (Signed)
Pt requesting glipizide to be filled @ [facility]

## 2015-09-04 ENCOUNTER — Other Ambulatory Visit: Payer: Self-pay | Admitting: *Deleted

## 2015-09-04 NOTE — Telephone Encounter (Signed)
PCP sent in new rx yesterday.Brooke Hidden Cassady4/28/20178:43 AM

## 2015-09-07 MED ORDER — METFORMIN HCL 1000 MG PO TABS: ORAL_TABLET | ORAL | Status: DC

## 2015-09-12 ENCOUNTER — Telehealth: Payer: Self-pay | Admitting: Internal Medicine

## 2015-09-14 NOTE — Addendum Note (Signed)
Addended by: Hulan Fray on: 09/14/2015 09:44 AM   Modules accepted: Orders

## 2015-09-15 NOTE — Telephone Encounter (Signed)
Left message to call clinic

## 2015-09-16 NOTE — Telephone Encounter (Signed)
Patient called, scheduled appointment for 09/24/2015.

## 2015-09-18 ENCOUNTER — Other Ambulatory Visit: Payer: Self-pay

## 2015-09-18 NOTE — Telephone Encounter (Signed)
Appointments scheduled 5/18 and 5/30

## 2015-09-21 MED ORDER — HYDROCHLOROTHIAZIDE 12.5 MG PO CAPS: 12.5000 mg | ORAL_CAPSULE | Freq: Every day | ORAL | Status: DC

## 2015-09-21 MED ORDER — MONTELUKAST SODIUM 10 MG PO TABS: 10.0000 mg | ORAL_TABLET | Freq: Every day | ORAL | Status: DC

## 2015-09-24 ENCOUNTER — Ambulatory Visit: Payer: BLUE CROSS/BLUE SHIELD | Admitting: Internal Medicine

## 2015-09-29 ENCOUNTER — Encounter: Payer: Self-pay | Admitting: Internal Medicine

## 2015-09-29 ENCOUNTER — Ambulatory Visit (INDEPENDENT_AMBULATORY_CARE_PROVIDER_SITE_OTHER): Payer: BLUE CROSS/BLUE SHIELD | Admitting: Dietician

## 2015-09-29 ENCOUNTER — Ambulatory Visit (INDEPENDENT_AMBULATORY_CARE_PROVIDER_SITE_OTHER): Payer: BLUE CROSS/BLUE SHIELD | Admitting: Internal Medicine

## 2015-09-29 VITALS — BP 142/72 | HR 101 | Temp 97.4°F | Ht 62.0 in | Wt 142.7 lb

## 2015-09-29 DIAGNOSIS — E119 Type 2 diabetes mellitus without complications: Secondary | ICD-10-CM | POA: Diagnosis not present

## 2015-09-29 DIAGNOSIS — Z713 Dietary counseling and surveillance: Secondary | ICD-10-CM

## 2015-09-29 DIAGNOSIS — G2581 Restless legs syndrome: Secondary | ICD-10-CM | POA: Diagnosis not present

## 2015-09-29 DIAGNOSIS — Z794 Long term (current) use of insulin: Secondary | ICD-10-CM

## 2015-09-29 DIAGNOSIS — J4520 Mild intermittent asthma, uncomplicated: Secondary | ICD-10-CM

## 2015-09-29 DIAGNOSIS — Z7984 Long term (current) use of oral hypoglycemic drugs: Secondary | ICD-10-CM | POA: Diagnosis not present

## 2015-09-29 DIAGNOSIS — F32A Depression, unspecified: Secondary | ICD-10-CM

## 2015-09-29 DIAGNOSIS — F329 Major depressive disorder, single episode, unspecified: Secondary | ICD-10-CM | POA: Diagnosis not present

## 2015-09-29 DIAGNOSIS — Z1881 Retained glass fragments: Secondary | ICD-10-CM

## 2015-09-29 LAB — POCT GLYCOSYLATED HEMOGLOBIN (HGB A1C): Hemoglobin A1C: 8.5

## 2015-09-29 LAB — GLUCOSE, CAPILLARY: Glucose-Capillary: 324 mg/dL — ABNORMAL HIGH (ref 65–99)

## 2015-09-29 MED ORDER — PRAMIPEXOLE DIHYDROCHLORIDE 0.125 MG PO TABS: 0.1250 mg | ORAL_TABLET | Freq: Every day | ORAL | Status: DC

## 2015-09-29 MED ORDER — MOMETASONE FURO-FORMOTEROL FUM 100-5 MCG/ACT IN AERO: 2.0000 | INHALATION_SPRAY | Freq: Two times a day (BID) | RESPIRATORY_TRACT | Status: DC

## 2015-09-29 MED ORDER — PRAMIPEXOLE DIHYDROCHLORIDE 0.5 MG PO TABS: 0.5000 mg | ORAL_TABLET | Freq: Every day | ORAL | Status: DC

## 2015-09-29 MED ORDER — ALBUTEROL SULFATE HFA 108 (90 BASE) MCG/ACT IN AERS: 2.0000 | INHALATION_SPRAY | Freq: Four times a day (QID) | RESPIRATORY_TRACT | Status: DC | PRN

## 2015-09-29 MED ORDER — LORATADINE 10 MG PO TABS: 10.0000 mg | ORAL_TABLET | Freq: Every day | ORAL | Status: DC

## 2015-09-29 MED ORDER — SERTRALINE HCL 50 MG PO TABS: 50.0000 mg | ORAL_TABLET | Freq: Every day | ORAL | Status: DC

## 2015-09-29 MED ORDER — ALBUTEROL SULFATE HFA 108 (90 BASE) MCG/ACT IN AERS
2.0000 | INHALATION_SPRAY | Freq: Four times a day (QID) | RESPIRATORY_TRACT | Status: DC | PRN
Start: 2015-09-29 — End: 2015-11-30

## 2015-09-29 NOTE — Assessment & Plan Note (Signed)
A: Patient's previous GI discomfort on requip likely related to not slowly introducing the medications. Symptoms have been unresponsive to amitriptyline. We will initiate pramipexole with a gradual taper to a therapeutic to minimize side effects  P: Pramipexole 0.125 mg week 1, 0.25 mg week 2, and 0.5 mg week 3 qhs two hours before bedtime

## 2015-09-29 NOTE — Patient Instructions (Signed)
Mrs. Kornbluth,  It was a JOY taking care of you today.  For your diabetes, your A1c is still high. Without having your glucometer, it is difficult to ascertain the best route to treating your diabetes. Please bring in your glucometer the next visit. Please eat regular meals to avoid low blood sugar.  For your foot lesion, I have made a referral to a podiatrist.  For how you're feeling, I have started sertraline (Zoloft) 50 mg every day.  For your Restless Legs, I have started a new medication called pramipexole. You will take it as prescribed: You will take 1 pill of the 0.125 mg tablet 2 hours before bedtime for Week 1. You will take 2 pills of the 0.125 mg tablet 2 hours before bedtime for Week 2. You will take 4 pills of the 0.125 mg tablet 2 hours before bed for Week 3  Thereafter, you will obtain a new prescription for the 0.5 mg tablet, after which you will take 1 tablet 2 hours before bedtime.  For your asthma, I think we can get better control of your asthma symptoms if we treat your allergies better. I have prescribed loratadine (Claritin) 10 mg daily, but you can get it over the counter if it's cheaper that way.  Have a lovely day.

## 2015-09-29 NOTE — Patient Instructions (Signed)
Brooke Floyd,   It would be great to briefly review the consistent carb meal plan we talked about today at your next visit.   We talked about trying to eat about the same amount of carb at each meal: between    A CARB is fruits, starchy foods, sweets, milk and yogurt,  Starchy veggies like corn, potatoes, pintos, limas  Breakfast- 45-60 grams CARBS Lunch-45-60 grams per meal CARBS Dinner-45-60 grams per meal CARBS Snacks ~15-20 grams CARBS  You get to choose what CARBS you eat.   Call anytime I can be of help!    Butch Penny [phone]

## 2015-09-29 NOTE — Assessment & Plan Note (Signed)
A: Patient has had an episode of symptomatic hypoglycemia, likely related to her evening glipizide. Her A1c elevated at 8.5 today. However, without a log from her glucometer, it is difficult to determine how to adjust her medications. She was told to come in next week with her glucometer readings.  P: Continue metformin 1g BID, glipizide 5 mg bid, lantus 50 qhs Referral to dietician F/u 1 week

## 2015-09-29 NOTE — Assessment & Plan Note (Signed)
A: Feet appear calloused on exam. A possible, miniscule retained glass is noted causing minimal discomfort.  P:  Referral to podiatry

## 2015-09-29 NOTE — Assessment & Plan Note (Signed)
A: Patient is 7/9 on SIGECAPS, unwilling to do PHQ-9. Passive sense of not wanting to live, but no active plan. Unresponsive to amitriptyline. Patient is in denial about her symptoms, but is amenable to further treatment.  P: Initiate sertraline 50 mg daily

## 2015-09-29 NOTE — Progress Notes (Signed)
Subjective:    Patient ID: Brooke Floyd, female    DOB: 06-Feb-1966, 50 y.o.   MRN: [id number]  HPI  Brooke Floyd is a 50 year old woman with a PMH of hypertension, DM2, hyperlipidemia, asthma who comes to the clinic to discuss acute and chronic problems as outlines below.  T2DM: Brooke Floyd says she has been adherent to Lantus 50U qhs, Metformin 1000 mg BID, and glipizide 5 mg BID. She reports one episode of symptomatic hypoglycemia last week with a BG in the 60s with associate diaphoresis, tachycardia, and lightheaded that was readily relieved with carbohydrate rich food. She has a glucometer at home with plenty of testing strips but she forgot to bring it to her appointment.  Mild foot discomfort after stepping on glass: Approximately 2 weeks ago, Brooke Floyd had stepped on a small "speck" of glass. She felt it when it occurred. She did not attempt to take it out. She says it does not bother her at rest, only when she stands on it for long periods. She denies any loss of sensation or burning sensation in her lower extremities.  Depressed Mood: Patient endorses sadness, minimal sleep, anhedonia, guilt, low energy, decreased concentration, and wish she was "not here." She denies any active suicidal ideation and does not have a plan. She indicates that 50 mg amitriptyline prescribed early last month has not modified her mood. She says these symptoms started when her mother died in the not too distant past. She is not interested in any form of grief counseling. She refuses to refer to her symptoms as "depression." She is not interested in a depression screen, such as the PHQ-9, today.  Restless Legs: Patient reports a significant leg discomfort associated with an intense urge to move her legs, especially at night. Her husband has noted that she constantly "kicks" and cannot "lay still." She had previous tried ropinirole, but she was unable to tolerate it due to GI discomfort and flushed it down  the toilet. She was started on a trial of amitriptyline, which has not been effective.  Asthma: Brooke Floyd has been adherent with Dulera 2 puffs BID and Singulair. She has been needing her albuterol once daily, whereas, she had only been needing it once per week. She attributes this to her pollen allergies. While she denies it presently, she has had itchy and watery eyes.    Review of Systems  Constitutional: Negative for fever and fatigue.  HENT: Negative for congestion and sore throat.   Eyes: Negative for redness and itching.  Respiratory: Negative for cough, shortness of breath and wheezing.   Cardiovascular: Negative for chest pain and palpitations.  Gastrointestinal: Negative for nausea and abdominal pain.  Endocrine: Negative for polydipsia, polyphagia and polyuria.  Genitourinary: Negative for dysuria and frequency.  Musculoskeletal: Negative for back pain and arthralgias.  Skin: Positive for wound. Negative for rash.  Psychiatric/Behavioral: Positive for sleep disturbance, dysphoric mood and decreased concentration. Negative for suicidal ideas. The patient is not nervous/anxious.        Objective:   Physical Exam  HENT:  Mouth/Throat: Oropharynx is clear and moist. No oropharyngeal exudate.  Eyes: Conjunctivae are normal. No scleral icterus.  Neck: Neck supple.  Cardiovascular: Normal rate, regular rhythm and normal heart sounds.   Pulmonary/Chest: Effort normal and breath sounds normal. No respiratory distress. She has no wheezes.  Abdominal: Soft. Bowel sounds are normal. She exhibits no distension. There is no tenderness.  Musculoskeletal: She exhibits no edema or tenderness.  Lymphadenopathy:    She has no cervical adenopathy.  Neurological: She is alert. She has normal reflexes.  Skin: Skin is warm.  <0.70mm white lesion with overlying callous on lateral aspect of left foot; only tender to deep palpation  Psychiatric: She has a normal mood and affect. Her behavior is  normal.  Vitals reviewed.         Assessment & Plan:   Please see problem based assessment and plan for details.

## 2015-09-29 NOTE — Progress Notes (Signed)
  Medical Nutrition Therapy:  Appt start time: [id number] end time:  1615. Visit # 1  Assessment:  Primary concerns today: blood sugar control.  Brooke Floyd wants help with meal planning to help her better control her blood sugars. She has had some diabetes training in the past and has limited/fair knowledge about what foods increase blood sugar. She says her husband is supportive at times and encourages her to over eat at other times. She does not drink alcohol or smoke.  Preferred Learning Style: No preference indicated  Learning Readiness: Ready   ANTHROPOMETRICS: weight-142#, BMI-26 WEIGHT HISTORY:stable SLEEP:reports her sleep is poor, goes to bed about 10-11 and wakes 3 days a week at 3:30 AM MEDICATIONS: lantus 50 units daily- reports very good adherence and lows int the 60s if she doesn't eat a bedtime snack, takes metformin & glipizide with lunch and at bedtime.  BLOOD SUGAR:no meter with her today, says she will bring it next visit DIETARY INTAKE: Usual eating pattern includes 3 meals and 1 snacks per day.   24-hr recall:  B ( AM): large muffin and fruit L ( PM): unsure what she eats, but 3-4 days this is at work D ( PM): 2 slices pizza, meat, vegetables, chicken. fish Snk ( PM): orange Beverages: mineral water  Usual physical activity: walks on occasion and activity at work  Daily Estimated energy needs: 1500-1700 calories 150-200 g carbohydrates  Progress Towards Goal(s):  In progress.   Nutritional Diagnosis:  NB-1.1 Food and nutrition-related knowledge deficit As related to lack of sufficient knowledge about diabetes meal planning.  As evidenced by her reprot and lack of knoweldge about portions and carb content of foods.    Intervention:  Nutrition education about carb consistency to assist her with blood sugar and weight control. Coordination of care: suggest lowering lantus dose, also timing of her glipizide is not optimal, suggested she take them at breakfast and dinner    Teaching Method Utilized: Visual,,Auditory,,Hands on Handouts given during visit include: Barriers to learning/adherence to lifestyle change:lack of material resources, organizational skills and competing priorities Demonstrated degree of understanding via:  Teach Back   Monitoring/Evaluation:  Dietary intake, exercise, meter, and body weight in 4 week(s).

## 2015-09-29 NOTE — Assessment & Plan Note (Signed)
A: Patient seems to have more difficulty with symptom control with seasonal allergies.  P: Add Loratadine 10 mg daily to current regimen

## 2015-09-30 NOTE — Progress Notes (Signed)
Internal Medicine Clinic Attending  Case discussed with Dr. Marijean Bravo at the time of the visit.  We reviewed the resident's history and exam and pertinent patient test results.  I agree with the assessment, diagnosis, and plan of care documented in the resident's note. Patient presented with chief complaint of diabetes.

## 2015-10-02 ENCOUNTER — Telehealth: Payer: Self-pay | Admitting: Internal Medicine

## 2015-10-02 NOTE — Telephone Encounter (Signed)
APPT. REMINDER CALL, LMTCB °

## 2015-10-06 ENCOUNTER — Encounter: Payer: BLUE CROSS/BLUE SHIELD | Admitting: Dietician

## 2015-10-06 ENCOUNTER — Encounter: Payer: BLUE CROSS/BLUE SHIELD | Admitting: Internal Medicine

## 2015-10-08 ENCOUNTER — Other Ambulatory Visit: Payer: Self-pay | Admitting: Internal Medicine

## 2015-10-09 ENCOUNTER — Ambulatory Visit (INDEPENDENT_AMBULATORY_CARE_PROVIDER_SITE_OTHER): Payer: BLUE CROSS/BLUE SHIELD | Admitting: Podiatry

## 2015-10-09 ENCOUNTER — Encounter: Payer: Self-pay | Admitting: Podiatry

## 2015-10-09 ENCOUNTER — Ambulatory Visit (INDEPENDENT_AMBULATORY_CARE_PROVIDER_SITE_OTHER): Payer: BLUE CROSS/BLUE SHIELD

## 2015-10-09 VITALS — BP 149/68 | HR 102 | Resp 16 | Ht 62.0 in | Wt 140.0 lb

## 2015-10-09 DIAGNOSIS — L923 Foreign body granuloma of the skin and subcutaneous tissue: Secondary | ICD-10-CM | POA: Diagnosis not present

## 2015-10-09 DIAGNOSIS — M79672 Pain in left foot: Secondary | ICD-10-CM | POA: Diagnosis not present

## 2015-10-09 NOTE — Patient Instructions (Signed)

## 2015-10-09 NOTE — Progress Notes (Signed)
   Subjective:    Patient ID: Brooke Floyd, female    DOB: Aug 20, 1965, 50 y.o.   MRN: [id number]  HPI Chief Complaint  Patient presents with  . Foot Pain    Left foot; heel; lateral side; pt stated, "Feels like someting is in foot for the past month"      Review of Systems  All other systems reviewed and are negative.      Objective:   Physical Exam        Assessment & Plan:

## 2015-10-12 NOTE — Progress Notes (Signed)
Subjective:     Patient ID: CARALEIGH EAKLE, female   DOB: [birth year], 50 y.o.   MRN: [id number]  HPI patient presents with deformity in the outside of her left foot with irritation and states she stepped on something which bled and she thinks there is a piece of glass in there   Review of Systems  All other systems reviewed and are negative.      Objective:   Physical Exam  Constitutional: She is oriented to person, place, and time.  Cardiovascular: Intact distal pulses.   Musculoskeletal: Normal range of motion.  Neurological: She is oriented to person, place, and time.  Skin: Skin is warm.  Nursing note and vitals reviewed.  neurovascular status intact muscle strength adequate range of motion within normal limits with patient found to have an irritation of the outside of the left lateral foot on the proximal side that shows a small area penetration measuring approximately 2 x 2 mm with no erythema edema or drainage surrounding it. Patient's found have good digital perfusion and is well oriented 3     Assessment:     Possibility for foreign body versus wart or other unknown soft tissue lesion    Plan:     H&P and x-ray reviewed with patient. At this time I did a proximal nerve block of the area and after appropriate numbness using sterile sharp instrumentation I opened the area up and found a small shard of glass superficially which I removed and then flushed the area. I did not note any further abnormal tissue but I did explain there may be some kind of epidermal cyst associated with trauma and that this may recur. I applied sterile dressing instructed on soaks and reappoint for [place] to reevaluate again  Fourth indicates no indications of foreign body upon review of conventional x-rays

## 2015-10-13 ENCOUNTER — Encounter: Payer: BLUE CROSS/BLUE SHIELD | Admitting: Dietician

## 2015-10-27 ENCOUNTER — Other Ambulatory Visit: Payer: Self-pay | Admitting: Internal Medicine

## 2015-10-28 ENCOUNTER — Other Ambulatory Visit: Payer: Self-pay | Admitting: Internal Medicine

## 2015-11-03 ENCOUNTER — Other Ambulatory Visit: Payer: Self-pay | Admitting: *Deleted

## 2015-11-04 MED ORDER — QUINAPRIL HCL 20 MG PO TABS: 40.0000 mg | ORAL_TABLET | Freq: Every day | ORAL | Status: DC

## 2015-11-09 ENCOUNTER — Ambulatory Visit: Payer: BLUE CROSS/BLUE SHIELD | Admitting: Internal Medicine

## 2015-11-21 ENCOUNTER — Other Ambulatory Visit: Payer: Self-pay | Admitting: Internal Medicine

## 2015-11-23 NOTE — Telephone Encounter (Signed)
Needs appt

## 2015-11-30 ENCOUNTER — Ambulatory Visit (INDEPENDENT_AMBULATORY_CARE_PROVIDER_SITE_OTHER): Payer: BLUE CROSS/BLUE SHIELD | Admitting: Internal Medicine

## 2015-11-30 ENCOUNTER — Encounter: Payer: Self-pay | Admitting: Internal Medicine

## 2015-11-30 ENCOUNTER — Ambulatory Visit: Payer: BLUE CROSS/BLUE SHIELD | Admitting: Internal Medicine

## 2015-11-30 DIAGNOSIS — G2581 Restless legs syndrome: Secondary | ICD-10-CM | POA: Diagnosis not present

## 2015-11-30 DIAGNOSIS — J4520 Mild intermittent asthma, uncomplicated: Secondary | ICD-10-CM

## 2015-11-30 MED ORDER — MOMETASONE FURO-FORMOTEROL FUM 100-5 MCG/ACT IN AERO: 2.0000 | INHALATION_SPRAY | Freq: Two times a day (BID) | RESPIRATORY_TRACT | 11 refills | Status: DC

## 2015-11-30 MED ORDER — METHYLPREDNISOLONE ACETATE 80 MG/ML IJ SUSP
80.0000 mg | Freq: Once | INTRAMUSCULAR | Status: AC
  Administered 2015-11-30: 80 mg via INTRAMUSCULAR

## 2015-11-30 MED ORDER — MONTELUKAST SODIUM 10 MG PO TABS: 10.0000 mg | ORAL_TABLET | Freq: Every day | ORAL | 3 refills | Status: DC

## 2015-11-30 MED ORDER — ALBUTEROL SULFATE HFA 108 (90 BASE) MCG/ACT IN AERS: 2.0000 | INHALATION_SPRAY | Freq: Four times a day (QID) | RESPIRATORY_TRACT | 11 refills | Status: DC | PRN

## 2015-11-30 NOTE — Progress Notes (Signed)
Subjective:     Patient ID: Brooke Floyd, female   DOB: [birth year], 50 y.o.   MRN: [id number]  HPI   01/28/13- 63 yoF never smoker, followed for asthma complicated by GERD.  FOLLOWS [id number] Proair only when wheezing; has not used Dulera inhaler in about 1 month-feels fine without it. Needs refill for Singulair. Has had flu and pneumonia vaccines.  Had a cold last week with not much wheeze. Using rescue inhaler about twice a week  10/30/14- 49 yoF never smoker, followed for asthma complicated by GERD. FOLLOWS FOR: last seen 01/2013.  pt has no breathing complaints at this time. 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 Using [facility] 8 units inhaler twice daily with only occasional need for rescue inhaler and no significant cough or wheeze. She wants to continue these medicines and Singulair.  11/30/2015-50 year old female never smoker followed for asthma complicated by GERD, DM, GERD, HBP, restless legs ACUTE VISIT:Pt has been using rescue inhaler daily for about the past 2 weeks. SOB and wheezing. Denies any cough. Increased short of breath for the past 2 weeks without infection or acute symptoms. Blames the hot weather. Using her medications as instructed. Diabetic but interested in getting a steroid injection which should help her before. 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 Awakens at night with leg cramps. Wants natural" therapy for this".  ROS-see HPI Constitutional:   No-   weight loss, night sweats, fevers, chills, fatigue, lassitude.  HEENT:   No-  headaches, difficulty swallowing, tooth/dental problems, sore throat,       No-  sneezing, itching, ear ache,+ nasal congestion, post nasal drip,  CV:  No-   chest pain, orthopnea, PND, swelling in lower extremities, anasarca, dizziness, palpitations Resp: +   shortness of breath with exertion or at rest.              No-    productive cough, no- non-productive cough,  No- coughing up of blood.              No-   change in color of mucus.  + wheezing.   Skin: No-   rash or lesions. GI:  No-   heartburn, indigestion, abdominal pain, nausea, vomiting,  GU:  MS:  No-   joint pain or swelling.  . Neuro-     nothing unusual Psych:  No- change in mood or affect. No depression or anxiety.  No memory loss.  OBJ- Physical Exam General- Alert, Oriented, Affect-appropriate, Distress- none acute. Overweight Skin- rash-none, lesions- none, excoriation- none Lymphadenopathy- tender L axilla c/w hydradinitis Head- atraumatic            Eyes- Gross vision intact, PERRLA, conjunctivae and secretions clear            Ears- Hearing, canals-normal            Nose- Clear, no-Septal dev, mucus, polyps, erosion, perforation             Throat- Mallampati III , mucosa clear , drainage- none, tonsils- atrophic Neck- flexible , trachea midline, no stridor , thyroid nl, carotid no bruit Chest - symmetrical excursion , unlabored           Heart/CV- RRR , no murmur , no gallop  , no rub, nl s1 s2                           -  JVD- none , edema- none, stasis changes- none, varices- none           Lung- clear, wheeze- none, cough- none , dullness-none, rub- none, unlabored           Chest wall-  Abd-  Br/ Gen/ Rectal- Not done, not indicated Extrem- cyanosis- none, clubbing, none, atrophy- none, strength- nl Neuro- grossly intact to observation

## 2015-11-30 NOTE — Patient Instructions (Signed)
Depo 80    Dx asthma moderate intermittent  Scripts sent refilling Dulera and albuterol rescue inhaler  Script sent refilling singulair

## 2015-12-01 NOTE — Assessment & Plan Note (Signed)
Increased symptoms without significant wheezing. I do not suspect heart failure, acute infection, PE or other serious medical problem. Hot weather seems to be a factor. Plan-medication education. Depo-Medrol. Refill Singulair

## 2015-12-01 NOTE — Assessment & Plan Note (Signed)
Stands up most of the day. Plan-since she wants "natural" treatments I suggested support hose, tonic water/quinine

## 2015-12-03 ENCOUNTER — Other Ambulatory Visit: Payer: Self-pay | Admitting: Internal Medicine

## 2015-12-03 ENCOUNTER — Telehealth: Payer: Self-pay | Admitting: Internal Medicine

## 2015-12-03 MED ORDER — MONTELUKAST SODIUM 10 MG PO TABS: 10.0000 mg | ORAL_TABLET | Freq: Every day | ORAL | 3 refills | Status: DC

## 2015-12-03 NOTE — Telephone Encounter (Signed)
Rx for Singulair EScribed to [facility]. Nothing further needed.

## 2015-12-03 NOTE — Telephone Encounter (Signed)
hydrochlorothiazide (MICROZIDE) 12.5 MG capsule quinapril (ACCUPRIL) 20 MG tablet metFORMIN (GLUCOPHAGE) 1000 MG tablet  [facility]

## 2015-12-04 ENCOUNTER — Telehealth: Payer: Self-pay | Admitting: Internal Medicine

## 2015-12-04 ENCOUNTER — Other Ambulatory Visit: Payer: Self-pay | Admitting: *Deleted

## 2015-12-04 DIAGNOSIS — G2581 Restless legs syndrome: Secondary | ICD-10-CM

## 2015-12-04 LAB — HM DIABETES EYE EXAM

## 2015-12-04 MED ORDER — PRAMIPEXOLE DIHYDROCHLORIDE 0.5 MG PO TABS: 0.5000 mg | ORAL_TABLET | Freq: Every day | ORAL | 1 refills | Status: DC

## 2015-12-04 MED ORDER — QUINAPRIL HCL 20 MG PO TABS: 40.0000 mg | ORAL_TABLET | Freq: Every day | ORAL | 0 refills | Status: DC

## 2015-12-04 MED ORDER — METFORMIN HCL 1000 MG PO TABS: ORAL_TABLET | ORAL | 0 refills | Status: DC

## 2015-12-04 MED ORDER — AZITHROMYCIN 250 MG PO TABS: ORAL_TABLET | ORAL | 0 refills | Status: DC

## 2015-12-04 MED ORDER — HYDROCHLOROTHIAZIDE 12.5 MG PO CAPS: 12.5000 mg | ORAL_CAPSULE | Freq: Every day | ORAL | 0 refills | Status: DC

## 2015-12-04 NOTE — Telephone Encounter (Signed)
Spoke with pt, c/o body aches, nonprod cough, fatigue, sob, sneezing X2 days.  Denies fever, mucus production, sinus congestion.  Pt has not taken anything to help with s/s.    Pt uses [facility] on [address].    Sending to MW as Adonis Housekeeper is off today.  Please advise on recs.  Thanks!   Instructions     Return in about 1 year (around 11/29/2016).  Depo 80    Dx asthma moderate intermittent   Scripts sent refilling Dulera and albuterol rescue inhaler   Script sent refilling singulair

## 2015-12-04 NOTE — Telephone Encounter (Signed)
Pt aware of recs.  rx sent to preferred pharmacy.  Pt states she will call next week to schedule ov if her s/s don't improve.  Nothing further needed.

## 2015-12-04 NOTE — Telephone Encounter (Signed)
Ok to take zpak if has fever, sorethroat or purulent sputum otherwise just hold it as likely viral rx cough with mucinex Note on acei so may need re-eval early next week as this can sure make cough worse

## 2015-12-07 ENCOUNTER — Telehealth: Payer: Self-pay | Admitting: Internal Medicine

## 2015-12-07 NOTE — Telephone Encounter (Signed)
lmtcb X1 for pt  

## 2015-12-08 MED ORDER — BENZONATATE 200 MG PO CAPS: 200.0000 mg | ORAL_CAPSULE | Freq: Three times a day (TID) | ORAL | 0 refills | Status: DC | PRN

## 2015-12-08 NOTE — Telephone Encounter (Signed)
lmomtcb x 2  

## 2015-12-08 NOTE — Telephone Encounter (Signed)
Spoke with the pt and notified of recs per CDY  Pt verbalized understanding and I have sent benzonate to pharm  She declined pack  She is asking about medication refills "of both my inhalers" through pt assistance She could not tell me the name of these meds, so I assume ventolin and dulera  I called [employer] pt assistance and spoke with Di Kindle  She states that they only mailed 1 shipment of Dulera on 03-28-14 and she never called back for any refills  The application is expired as of 03/10/15  I spoke with the pt and notified of this and she verbalized understanding  She will pick up [employer] pt assistance forms today, I have left up front  Nothing further needed per pt

## 2015-12-08 NOTE — Telephone Encounter (Signed)
Patient calling back . She can be reached at [phone]

## 2015-12-08 NOTE — Telephone Encounter (Signed)
Offer repeat Z pak           Benzonatate 200 mg, # 30, 1 evey 8 hours if needed for cough. Can also take otc Mucinex- DM

## 2015-12-08 NOTE — Telephone Encounter (Signed)
LMOMTCB x 1 

## 2015-12-08 NOTE — Telephone Encounter (Signed)
Spoke with the pt  She states feeling much better since she started zpack 12/04/15 but still c/o cough  Cough is non prod and mucinex does not seem to be helping Please advise, thanks! Allergies  Allergen Reactions  . Diclofenac Sodium Itching  . Penicillins Other (See Comments)    convulsions   Current Outpatient Prescriptions on File Prior to Visit  Medication Sig Dispense Refill  . albuterol (PROVENTIL HFA;VENTOLIN HFA) 108 (90 Base) MCG/ACT inhaler Inhale 2 puffs into the lungs every 6 (six) hours as needed for wheezing or shortness of breath. 1 Inhaler 11  . aspirin 81 MG tablet Take 81 mg by mouth daily.     Marland Kitchen azithromycin (ZITHROMAX) 250 MG tablet Take 2 tabs today, then 1 daily until gone. 6 tablet 0  . Cholecalciferol (VITAMIN D-3) 5000 UNITS TABS Take 1 tablet by mouth daily.    Marland Kitchen glipiZIDE (GLUCOTROL) 5 MG tablet Take 1 tablet (5 mg total) by mouth 2 (two) times daily before a meal. 60 tablet 5  . hydrochlorothiazide (MICROZIDE) 12.5 MG capsule Take 1 capsule (12.5 mg total) by mouth daily. 90 capsule 0  . LANTUS 100 UNIT/ML injection INJECT 0.5MLS INTO THE SKIN AT BEDTIME 10 mL 3  . loratadine (CLARITIN) 10 MG tablet Take 1 tablet (10 mg total) by mouth daily. 30 tablet 2  . metFORMIN (GLUCOPHAGE) 1000 MG tablet TAKE 1 TABLET BY MOUTH TWICE DAILY WITH A MEAL 180 tablet 0  . mometasone-formoterol (DULERA) 100-5 MCG/ACT AERO Inhale 2 puffs into the lungs 2 (two) times daily. Rinse mouth 1 Inhaler 11  . montelukast (SINGULAIR) 10 MG tablet Take 1 tablet (10 mg total) by mouth at bedtime. 90 tablet 3  . pramipexole (MIRAPEX) 0.5 MG tablet Take 1 tablet (0.5 mg total) by mouth at bedtime. 90 tablet 1  . quinapril (ACCUPRIL) 20 MG tablet Take 2 tablets (40 mg total) by mouth daily. 180 tablet 0  . simvastatin (ZOCOR) 80 MG tablet TAKE 1 TABLET BY MOUTH BEFORE BEDTIME 30 tablet 9   No current facility-administered medications on file prior to visit.

## 2015-12-09 ENCOUNTER — Other Ambulatory Visit: Payer: Self-pay | Admitting: *Deleted

## 2015-12-09 MED ORDER — SIMVASTATIN 80 MG PO TABS: ORAL_TABLET | ORAL | 0 refills | Status: DC

## 2015-12-09 MED ORDER — GLIPIZIDE 5 MG PO TABS: 5.0000 mg | ORAL_TABLET | Freq: Two times a day (BID) | ORAL | 0 refills | Status: DC

## 2015-12-10 ENCOUNTER — Telehealth: Payer: Self-pay | Admitting: Internal Medicine

## 2015-12-10 ENCOUNTER — Encounter: Payer: Self-pay | Admitting: Internal Medicine

## 2015-12-10 NOTE — Telephone Encounter (Signed)
Called and spoke with pt and she is aware that we have the forms completed.  She will need to bring in the income statement to be faxed in with this.  Pt stated that she will bring this in tomorrow.  Will hold in my box until the pt returns tomorrow.

## 2015-12-11 NOTE — Telephone Encounter (Signed)
Patient came back and brought check stubs. Placed in Dr Janee Morn folder.-pr

## 2015-12-11 NOTE — Telephone Encounter (Signed)
Forms dropped off by pt today and these have been placed on CY cart to have him sign the patient assistance forms.

## 2015-12-14 NOTE — Telephone Encounter (Signed)
done

## 2015-12-14 NOTE — Telephone Encounter (Signed)
Katie did CY give these papers to you by chance?

## 2015-12-14 NOTE — Telephone Encounter (Signed)
Forms are on CY's cart.

## 2015-12-15 ENCOUNTER — Ambulatory Visit (INDEPENDENT_AMBULATORY_CARE_PROVIDER_SITE_OTHER): Payer: BLUE CROSS/BLUE SHIELD | Admitting: Internal Medicine

## 2015-12-15 ENCOUNTER — Encounter: Payer: Self-pay | Admitting: Internal Medicine

## 2015-12-15 VITALS — BP 145/61 | HR 84 | Temp 98.3°F | Ht 62.0 in | Wt 138.4 lb

## 2015-12-15 DIAGNOSIS — E11649 Type 2 diabetes mellitus with hypoglycemia without coma: Secondary | ICD-10-CM

## 2015-12-15 DIAGNOSIS — Z794 Long term (current) use of insulin: Secondary | ICD-10-CM

## 2015-12-15 DIAGNOSIS — E119 Type 2 diabetes mellitus without complications: Secondary | ICD-10-CM

## 2015-12-15 DIAGNOSIS — K219 Gastro-esophageal reflux disease without esophagitis: Secondary | ICD-10-CM

## 2015-12-15 DIAGNOSIS — I10 Essential (primary) hypertension: Secondary | ICD-10-CM | POA: Diagnosis not present

## 2015-12-15 DIAGNOSIS — R0789 Other chest pain: Secondary | ICD-10-CM | POA: Diagnosis not present

## 2015-12-15 DIAGNOSIS — F32A Depression, unspecified: Secondary | ICD-10-CM

## 2015-12-15 DIAGNOSIS — Z79899 Other long term (current) drug therapy: Secondary | ICD-10-CM

## 2015-12-15 DIAGNOSIS — J4520 Mild intermittent asthma, uncomplicated: Secondary | ICD-10-CM

## 2015-12-15 DIAGNOSIS — F329 Major depressive disorder, single episode, unspecified: Secondary | ICD-10-CM

## 2015-12-15 DIAGNOSIS — R011 Cardiac murmur, unspecified: Secondary | ICD-10-CM

## 2015-12-15 DIAGNOSIS — Z0000 Encounter for general adult medical examination without abnormal findings: Secondary | ICD-10-CM

## 2015-12-15 LAB — POCT GLYCOSYLATED HEMOGLOBIN (HGB A1C): Hemoglobin A1C: 8.3

## 2015-12-15 LAB — GLUCOSE, CAPILLARY: Glucose-Capillary: 257 mg/dL — ABNORMAL HIGH (ref 65–99)

## 2015-12-15 MED ORDER — GLIPIZIDE 10 MG PO TABS: 10.0000 mg | ORAL_TABLET | Freq: Every day | ORAL | 3 refills | Status: DC

## 2015-12-15 NOTE — Telephone Encounter (Signed)
Katie please advise if this message can be closed.  Thanks!  

## 2015-12-15 NOTE — Telephone Encounter (Signed)
CY has signed forms. Spoke with pt and she requested that we mail them to [employer]. Forms mailed. Copy given to Katie. Nothing further needed.

## 2015-12-15 NOTE — Telephone Encounter (Signed)
[phone] pt calling back

## 2015-12-15 NOTE — Patient Instructions (Signed)
Return for a follow-up visit in 1 month.  Glipizide has been changed to 10 mg once daily.  Continue using Lantus and Metformin as before.  Check your blood sugar twice daily and bring your meter to the next visit.  An echocardiogram of your heart has been ordered. Our clinic will set up the appointment for you.

## 2015-12-17 DIAGNOSIS — R079 Chest pain, unspecified: Secondary | ICD-10-CM | POA: Insufficient documentation

## 2015-12-17 NOTE — Progress Notes (Signed)
   CC: Patient is here to discuss her hypertension, asthma, and diabetes.  HPI:  [name] is a 50 y.o. female with past medical history of conditions listed below presenting to the clinic to discuss her hypertension, asthma, and diabetes. During review of systems, patient also mentioned having intermittent chest pain.  Please see problem based charting for the status of the patient's current and chronic medical conditions.  Past Medical History:  Diagnosis Date  . GERD (gastroesophageal reflux disease)   . Hypertension   . Other symptoms involving nervous and musculoskeletal systems(781.99)   . Type II or unspecified type diabetes mellitus without mention of complication, not stated as uncontrolled    takes insulin  . Unspecified asthma, with exacerbation     Review of Systems:  Pertinent positives mentioned in history of present illness. All other review of systems negative.  Physical Exam:  Vitals:   12/15/15 1605  BP: (!) 145/61  Pulse: 84  Temp: 98.3 F (36.8 C)  TempSrc: Oral  SpO2: 100%  Weight: 138 lb 6.4 oz (62.8 kg)  Height: 5\' 2"  (1.575 m)   Physical Exam  Constitutional: She is oriented to person, place, and time. She appears well-developed and well-nourished. No distress.  HENT:  Head: Normocephalic and atraumatic.  Mouth/Throat: Oropharynx is clear and moist.  Eyes: EOM are normal. Pupils are equal, round, and reactive to light.  Neck: Neck supple. No tracheal deviation present.  Cardiovascular: Normal rate, regular rhythm and intact distal pulses.  Exam reveals no gallop and no friction rub.   Murmur heard. Grade 3/6 systolic murmur radiating to the neck.   Pulmonary/Chest: Effort normal and breath sounds normal. No respiratory distress. She has no wheezes. She has no rales.  Abdominal: Soft. Bowel sounds are normal. She exhibits no distension. There is no tenderness. There is no guarding.  Musculoskeletal: Normal range of motion. She exhibits no  edema or deformity.  Neurological: She is alert and oriented to person, place, and time.  Skin: Skin is warm and dry.  Psychiatric:  Playing with her Ipad and not maintaining eye contact. Patient seemed disinterested in answering questions.    Assessment & Plan:   See Encounters Tab for problem based charting.  Patient discussed with Dr. Beryle Beams

## 2015-12-17 NOTE — Progress Notes (Signed)
Medicine attending: Medical history, presenting problems, physical findings, and medications, reviewed with resident physician Dr Beckie Salts on the day of the patient visit and I concur with her evaluation and management plan.

## 2015-12-17 NOTE — Assessment & Plan Note (Signed)
Referral for screening colonoscopy

## 2015-12-17 NOTE — Assessment & Plan Note (Signed)
History of present illness During review of systems, patient mentions having occasional, intermittent nonradiating chest pain which is sharp in character and substernal in location. Patient seemed disinterested in answering questions and it was difficult to elicit a history from her. She is not sure if the chest pain is associated with activity or occurs at rest. States the chest pain lasts for a few minutes and on average she gets 3 episodes in the month. Aspirin helps alleviate the pain. States the chest pain is associated with shortness of breath which resolves after she uses her inhalers for asthma. Denies having any associated nausea, vomiting, or diaphoresis. Reports having occasional GERD symptoms, well controlled with over-the-counter medications per patient. Denies having any chest pain or GERD symptoms at present.  Assessment Atypical chest pain. Asthma and/or occasional GERD could possibly be playing a role. However, there is concern for a cardiac cause of her chest pain as this patient does have risk factors including hypertension, hyperlipidemia, and uncontrolled diabetes. No EKG or echo on file.  Plan -Risk factor modification: Aspirin, BP control, statin, strict glycemic control -If patient continues to have symptoms at follow-up visit, consider referral for a stress test.

## 2015-12-17 NOTE — Assessment & Plan Note (Signed)
Assessment Patient states she was previously on sertraline but stopped taking it on her own. When asked when she stopped taking the medication, patient did not answer the question and stated "my mood is good."  Plan -Continue to monitor

## 2015-12-17 NOTE — Assessment & Plan Note (Signed)
Lab Results  Component Value Date   HGBA1C 8.3 12/15/2015   HGBA1C 8.5 09/29/2015   HGBA1C 8.8 06/29/2015     Assessment: Diabetes control:  above goal (A1c less than 7)  Comments: Patient states she has been "eating everything" and does not exercise. Currently on glipizide 5 mg twice daily, Lantus 50 units at bedtime, and metformin 1000 mg twice daily. She is requesting for glipizide to be changed to a 10 mg once daily dose so she can get it at a cheaper rate through a mail order pharmacy. Patient is checking her blood glucose 1-2 times daily and only in the evening. Meter is showing a few readings in the 79s and 60s. Patient reports eating dinner at 4 PM and has experienced symptoms of hypoglycemia close to bedtime a few times when she did not eat a snack.   Plan: Medications: Continue Lantus and metformin as above. Glipizide has been changed to 10 mg once daily dose. Home glucose monitoring: Frequency:  at least twice a day (patient refuses to check her blood glucose more than twice a day due to concern for the cost of testing supplies) Timing:  before breakfast and at bedtime Instruction/counseling given: reminded to bring blood glucose meter & log to each visit, reminded to bring medications to each visit, discussed foot care, discussed the need for weight loss and discussed diet   Other plans:  -Eye exam due: Patient states she just recently had her eye exam done in July. Our staff will try to obtain records. -Foot exam -Recheck A1c in 3 months. -Patient has been advised to return to the clinic in one month with her blood glucose meter. If meter reflects resolution of low blood glucose readings, consider increasing dose of basal insulin.

## 2015-12-17 NOTE — Assessment & Plan Note (Signed)
Assessment Stable. Patient states she is following up with Dr. Annamaria Boots (pulmonology) and just saw him last week for a cough. States her cough is now better after taking benzonatate. She has not used her albuterol rescue inhaler in over a week. Continues to use Dulera and Singulair as prescribed.  Plan -Continue current management

## 2015-12-17 NOTE — Assessment & Plan Note (Signed)
Assessment Stable. She uses over-the-counter medications as needed for occasional GERD symptoms.  Plan -Continue to monitor

## 2015-12-17 NOTE — Assessment & Plan Note (Signed)
BP Readings from Last 3 Encounters:  12/15/15 (!) 145/61  11/30/15 114/76  10/09/15 (!) 149/68    Lab Results  Component Value Date   NA 143 06/29/2015   K 4.7 06/29/2015   CREATININE 0.59 06/29/2015    Assessment: Blood pressure control:  above goal (less than 140/90) Comments: Blood pressure above goal today; was normal during previous visit. Currently on hydrochlorothiazide 12.5 mg daily and quinapril 40 mg daily. Patient states she was busy and did not have a chance take her blood pressure medications today.   Plan: Medications:  continue current medications Educational resources provided:   Educated patient about healthy eating and exercise. Emphasized the importance of medication compliance. Other plans:  -Recheck at follow-up visit

## 2015-12-17 NOTE — Assessment & Plan Note (Addendum)
Assessment Grade 3/6 systolic murmur radiating to the neck appreciated on exam today. Records show murmur has been appreciated on exam previously and an echo was ordered in 2015 but never done.  Plan -Reorder echo    Addendum (01/19/16):  Echo done 01/13/16 (Study Conclusions)  - Left ventricle: The cavity size was normal. Wall thickness was increased in a pattern of mild LVH. Systolic function was normal. The estimated ejection fraction was in the range of 55% to 60%. Wall motion was normal; there were no regional wall motion abnormalities. Doppler parameters are indeterminate for diastolic function. - Aortic valve: Poorly visualized. There was no stenosis. There was no regurgitation. - Left atrium: The atrium was normal in size. - Inferior vena cava: The vessel was normal in size. The respirophasic diameter changes were in the normal range (>= 50%), consistent with normal central venous pressure. - Pericardium, extracardiac: Small pericardial effusion. Features were not consistent with tamponade physiology.  A/P: Small pericardial effusion not likely due to uremia as patient's renal function is normal. TSH was last checked in 2014. Consider checking again at next visit. Discussed results with patient over the phone. Reports feeling well - denies having any SOB or LE edema.

## 2015-12-18 ENCOUNTER — Other Ambulatory Visit: Payer: Self-pay | Admitting: Internal Medicine

## 2015-12-18 NOTE — Telephone Encounter (Signed)
Sent request to dr rathore 

## 2015-12-18 NOTE — Telephone Encounter (Signed)
Patient asking for a 3 month supply Lantus  And Pens.

## 2015-12-21 MED ORDER — "PEN NEEDLES 3/16"" 31G X 5 MM MISC": 6 refills | Status: DC

## 2015-12-21 MED ORDER — INSULIN GLARGINE 100 UNIT/ML ~~LOC~~ SOLN: SUBCUTANEOUS | 6 refills | Status: DC

## 2015-12-24 NOTE — Telephone Encounter (Signed)
Received faxed request from [facility]) to change pt's lantus vials to to lantus pens (which is what pt was already taking).  Verbal ok given to have vials changed to pens.Despina Hidden Cassady8/17/20174:29 PM

## 2015-12-28 ENCOUNTER — Other Ambulatory Visit (HOSPITAL_COMMUNITY): Payer: BLUE CROSS/BLUE SHIELD

## 2015-12-30 ENCOUNTER — Telehealth: Payer: Self-pay | Admitting: Internal Medicine

## 2015-12-30 ENCOUNTER — Ambulatory Visit (HOSPITAL_COMMUNITY): Payer: Managed Care, Other (non HMO)

## 2015-12-30 DIAGNOSIS — J4520 Mild intermittent asthma, uncomplicated: Secondary | ICD-10-CM

## 2015-12-30 NOTE — Telephone Encounter (Signed)
Pt states she needs CY to sign Rx for [place] to send to [employer] for her [facility] inhaler.    Please advise.     Fax to [employer] at [phone].

## 2015-12-31 MED ORDER — ALBUTEROL SULFATE HFA 108 (90 BASE) MCG/ACT IN AERS: 2.0000 | INHALATION_SPRAY | Freq: Four times a day (QID) | RESPIRATORY_TRACT | 3 refills | Status: DC | PRN

## 2015-12-31 NOTE — Telephone Encounter (Signed)
lmomtcb x1 

## 2015-12-31 NOTE — Telephone Encounter (Signed)
Ok to refill 

## 2015-12-31 NOTE — Telephone Encounter (Signed)
Py returning call.Hillery Hunter

## 2015-12-31 NOTE — Telephone Encounter (Signed)
Please let patient know that Rx has been faxed to [facility]). Thanks.

## 2015-12-31 NOTE — Telephone Encounter (Signed)
Called made pt aware of below. Nothing further needed 

## 2015-12-31 NOTE — Telephone Encounter (Signed)
Pt can be reached @ [phone].Brooke Floyd

## 2016-01-05 ENCOUNTER — Telehealth: Payer: Self-pay | Admitting: Internal Medicine

## 2016-01-05 NOTE — Telephone Encounter (Signed)
Patient states that she sent in patient assistance paperwork, she said that her name was spelled incorrectly with a D instead of O and she needs [place] to call [employer] to clarify her information and her medications so they can send the medication today.   Called and spoke to [place] at [employer], and spoke with Market researcher at [employer].  Confirmed medications that patient needed and correction of name.  They advised me that they would mail the medication out to patient tonight and she should receive the medication in 2-3 days.  Called and advised patient. Nothing further needed.

## 2016-01-12 DIAGNOSIS — E113412 Type 2 diabetes mellitus with severe nonproliferative diabetic retinopathy with macular edema, left eye: Secondary | ICD-10-CM | POA: Diagnosis not present

## 2016-01-13 ENCOUNTER — Ambulatory Visit (HOSPITAL_COMMUNITY)
Admission: RE | Admit: 2016-01-13 | Discharge: 2016-01-13 | Disposition: A | Discharge status: Home / Self Care | Payer: Managed Care, Other (non HMO) | Source: Ambulatory Visit | Attending: Internal Medicine | Admitting: Internal Medicine

## 2016-01-13 DIAGNOSIS — J45901 Unspecified asthma with (acute) exacerbation: Secondary | ICD-10-CM | POA: Insufficient documentation

## 2016-01-13 DIAGNOSIS — K219 Gastro-esophageal reflux disease without esophagitis: Secondary | ICD-10-CM | POA: Insufficient documentation

## 2016-01-13 DIAGNOSIS — R011 Cardiac murmur, unspecified: Secondary | ICD-10-CM

## 2016-01-13 DIAGNOSIS — I313 Pericardial effusion (noninflammatory): Secondary | ICD-10-CM | POA: Insufficient documentation

## 2016-01-13 DIAGNOSIS — I517 Cardiomegaly: Secondary | ICD-10-CM | POA: Diagnosis not present

## 2016-01-13 DIAGNOSIS — E119 Type 2 diabetes mellitus without complications: Secondary | ICD-10-CM | POA: Diagnosis not present

## 2016-01-13 DIAGNOSIS — R079 Chest pain, unspecified: Secondary | ICD-10-CM | POA: Insufficient documentation

## 2016-01-13 LAB — ECHOCARDIOGRAM COMPLETE
E decel time: 169 msec
EERAT: 12.09
FS: 21 % — AB (ref 28–44)
IVS/LV PW RATIO, ED: 0.98
LA ID, A-P, ES: 34 mm
LADIAMINDEX: 2.09 cm/m2
LAVOLA4C: 30.8 mL
LDCA: 1.77 cm2
LEFT ATRIUM END SYS DIAM: 34 mm
LV PW d: 11.6 mm — AB (ref 0.6–1.1)
LV SIMPSON'S DISK: 54
LV TDI E'MEDIAL: 7.95
LV dias vol: 70 mL (ref 46–106)
LV sys vol index: 20 mL/m2
LV sys vol: 32 mL (ref 14–42)
LVDIAVOLIN: 43 mL/m2
LVEEAVG: 12.09
LVEEMED: 12.09
LVELAT: 9.68 cm/s
LVOT VTI: 25.4 cm
LVOT diameter: 15 mm
LVOT peak grad rest: 8 mmHg
LVOT peak vel: 137 cm/s
LVOTSV: 45 mL
MV Dec: 169
MVPG: 5 mmHg
MVPKAVEL: 78.6 m/s
MVPKEVEL: 117 m/s
RV TAPSE: 20.8 mm
Stroke v: 38 ml
TDI e' lateral: 9.68

## 2016-01-13 NOTE — Progress Notes (Signed)
  Echocardiogram 2D Echocardiogram has been performed.  Brooke Floyd 01/13/2016, 1:26 PM

## 2016-01-18 ENCOUNTER — Ambulatory Visit (INDEPENDENT_AMBULATORY_CARE_PROVIDER_SITE_OTHER): Payer: Managed Care, Other (non HMO) | Admitting: Internal Medicine

## 2016-01-18 ENCOUNTER — Encounter: Payer: Self-pay | Admitting: Internal Medicine

## 2016-01-18 VITALS — BP 148/75 | HR 97 | Temp 98.0°F | Ht 62.0 in | Wt 138.0 lb

## 2016-01-18 DIAGNOSIS — R0789 Other chest pain: Secondary | ICD-10-CM

## 2016-01-18 DIAGNOSIS — G2581 Restless legs syndrome: Secondary | ICD-10-CM

## 2016-01-18 DIAGNOSIS — E119 Type 2 diabetes mellitus without complications: Secondary | ICD-10-CM

## 2016-01-18 DIAGNOSIS — G4700 Insomnia, unspecified: Secondary | ICD-10-CM | POA: Diagnosis not present

## 2016-01-18 DIAGNOSIS — J4520 Mild intermittent asthma, uncomplicated: Secondary | ICD-10-CM

## 2016-01-18 MED ORDER — GLIPIZIDE 10 MG PO TABS: 10.0000 mg | ORAL_TABLET | Freq: Every day | ORAL | 3 refills | Status: DC

## 2016-01-18 MED ORDER — HYDROCHLOROTHIAZIDE 12.5 MG PO CAPS: 12.5000 mg | ORAL_CAPSULE | Freq: Every day | ORAL | 3 refills | Status: DC

## 2016-01-18 MED ORDER — BENZONATATE 200 MG PO CAPS: 200.0000 mg | ORAL_CAPSULE | Freq: Three times a day (TID) | ORAL | 0 refills | Status: DC | PRN

## 2016-01-18 MED ORDER — LORATADINE 10 MG PO TABS: 10.0000 mg | ORAL_TABLET | Freq: Every day | ORAL | 2 refills | Status: DC

## 2016-01-18 MED ORDER — ASPIRIN 81 MG PO TABS: 81.0000 mg | ORAL_TABLET | Freq: Every day | ORAL | 2 refills | Status: DC

## 2016-01-18 MED ORDER — "PEN NEEDLES 3/16"" 31G X 5 MM MISC": 6 refills | Status: DC

## 2016-01-18 MED ORDER — INSULIN GLARGINE 100 UNIT/ML ~~LOC~~ SOLN: SUBCUTANEOUS | 6 refills | Status: DC

## 2016-01-18 MED ORDER — SIMVASTATIN 80 MG PO TABS: ORAL_TABLET | ORAL | 2 refills | Status: DC

## 2016-01-18 MED ORDER — METFORMIN HCL 1000 MG PO TABS: ORAL_TABLET | ORAL | 2 refills | Status: DC

## 2016-01-18 MED ORDER — ALBUTEROL SULFATE HFA 108 (90 BASE) MCG/ACT IN AERS: 2.0000 | INHALATION_SPRAY | Freq: Four times a day (QID) | RESPIRATORY_TRACT | 3 refills | Status: DC | PRN

## 2016-01-18 MED ORDER — MONTELUKAST SODIUM 10 MG PO TABS: 10.0000 mg | ORAL_TABLET | Freq: Every day | ORAL | 3 refills | Status: DC

## 2016-01-18 MED ORDER — PRAMIPEXOLE DIHYDROCHLORIDE 0.5 MG PO TABS: 0.5000 mg | ORAL_TABLET | Freq: Every day | ORAL | 1 refills | Status: DC

## 2016-01-18 MED ORDER — MOMETASONE FURO-FORMOTEROL FUM 100-5 MCG/ACT IN AERO: 2.0000 | INHALATION_SPRAY | Freq: Two times a day (BID) | RESPIRATORY_TRACT | 11 refills | Status: DC

## 2016-01-18 MED ORDER — QUINAPRIL HCL 20 MG PO TABS: 40.0000 mg | ORAL_TABLET | Freq: Every day | ORAL | 0 refills | Status: DC

## 2016-01-18 NOTE — Patient Instructions (Signed)
Thank you for visiting our clinic today!  We were glad to see the normal results for your heart ultrasound.  To help with sleep, here are a list of tips to maintain good sleep hygiene:  - Go to bed and get up at the same time every day - Try to spend no more than 8 hours in bed - Avoid bright lights or bright screens before bed - Make sure the bedroom is quiet and dark - Avoid eating large meals right before bedtime - Avoid caffeine, nicotine, and alcohol - Attempt to exercise 30 minutes daily - Avoid exercising at night (before bedtime) - Relax for 30 minutes before bedtime

## 2016-01-18 NOTE — Assessment & Plan Note (Addendum)
Patient endorses persistent difficulty falling and staying asleep despite taking 2x each melatonin, Benadryl, Unisom at night. However endorses several behaviors concerning for poor sleep hygiene - chiefly late night meals and consistent tablet/phone video game use before bed. She has had some social stressors such as job/shift changes and moving into a smaller place.  Discussed the concept of sleep hygiene and provided a list of tips for improving this. Patient agreeable to trying theses strategies before attempting further pharmacologic options (e.g. Ambien, etc).

## 2016-01-18 NOTE — Assessment & Plan Note (Addendum)
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 The pain is now mild, intermittent, and typically resolves with deep breaths. The patient was curious about her echocardiogram result (normal LVEF, no valvular abnormalities, only mild LVH), and was reassured to learn that it was largely normal.

## 2016-01-18 NOTE — Progress Notes (Signed)
   CC: [place], follow up echocardiogram, refills  HPI:  [name] is a 50 y.o. female with PMHx detailed below presenting with ongoing insomnia, plan to discuss results of her recent echocardiogram, and request for printed refills of her prescriptions as she plans to change pharmacies.  See problem based assessment and plan below for additional details.  Past Medical History:  Diagnosis Date  . GERD (gastroesophageal reflux disease)   . Hypertension   . Other symptoms involving nervous and musculoskeletal systems(781.99)   . Type II or unspecified type diabetes mellitus without mention of complication, not stated as uncontrolled    takes insulin  . Unspecified asthma, with exacerbation     Review of Systems: Review of Systems  Constitutional: Negative for chills and fever.  Respiratory: Negative for cough and shortness of breath.   Cardiovascular: Positive for chest pain.  Gastrointestinal: Negative for abdominal pain.     Physical Exam: Vitals:   01/18/16 1016  BP: (!) 148/75  Pulse: 97  Temp: 98 F (36.7 C)  TempSrc: Oral  SpO2: 98%  Weight: 138 lb (62.6 kg)  Height: 5\' 2"  (1.575 m)   GENERAL- Woman sitting comfortably in exam room chair, alert, in no distress HEENT- Atraumatic, PERRL, EOMI, moist mucous membranes CARDIAC- Systolic murmur present, regular rhythm, no rubs or gallops. RESP- Clear to ascultation bilaterally, no wheezing or crackles, normal work of breathing ABDOMEN- Normoactive bowel sounds, soft, nontender, nondistended EXTREMITIES- Normal bulk and range of motion, no edema, 2+ peripheral pulses SKIN- Warm, dry, intact, without visible rash PSYCH- Appropriate affect, slowed speech, thoughts linear and goal-directed  Assessment & Plan:   See encounters tab for problem based medical decision making.  Patient seen with Dr. Eppie Gibson

## 2016-01-19 NOTE — Progress Notes (Signed)
Patient ID: Brooke Floyd, female   DOB: 03/13/1966, 50 y.o.   MRN: [id number]  I saw and evaluated the patient. I personally confirmed the key portions of Dr. Durenda Age history and exam and reviewed pertinent patient test results. The assessment, diagnosis, and plan were formulated together and I agree with the documentation in the resident's note.  We also spoke some about diet, and in particular, what to avoid or eat in moderation given her diabetes and what were hidden calories given her current attempts at weight loss.

## 2016-01-22 ENCOUNTER — Other Ambulatory Visit (HOSPITAL_COMMUNITY): Payer: BLUE CROSS/BLUE SHIELD

## 2016-01-28 ENCOUNTER — Telehealth: Payer: Self-pay | Admitting: Internal Medicine

## 2016-01-28 NOTE — Telephone Encounter (Signed)
Patient requesting A return visit to [facility] for her Headaches.  Per Office a Referral is requested.  Please advise.

## 2016-01-29 NOTE — Telephone Encounter (Signed)
Patient needs to be seen in the clinic for her headache before any referral can be placed. Thanks.

## 2016-02-01 NOTE — Telephone Encounter (Signed)
Called patient and offered the patient an appointment.  Pt declined at this time and will call us back.

## 2016-02-11 ENCOUNTER — Telehealth: Payer: Self-pay | Admitting: Internal Medicine

## 2016-02-11 NOTE — Telephone Encounter (Signed)
Asking for refill of lantus but says she wants the pen not the vial  [place]ane [phone]

## 2016-02-11 NOTE — Telephone Encounter (Signed)
Called pt to find out correct name of pharmacy to send rx.

## 2016-02-12 MED ORDER — INSULIN GLARGINE 100 UNIT/ML SOLOSTAR PEN: 50.0000 [IU] | PEN_INJECTOR | Freq: Every day | SUBCUTANEOUS | 6 refills | Status: DC

## 2016-02-15 ENCOUNTER — Other Ambulatory Visit: Payer: Self-pay | Admitting: Internal Medicine

## 2016-02-18 ENCOUNTER — Telehealth: Payer: Self-pay | Admitting: Internal Medicine

## 2016-02-18 NOTE — Telephone Encounter (Signed)
Tried to call pt got no answer, lm for rtc

## 2016-02-18 NOTE — Telephone Encounter (Signed)
Trouble sleeping would like something for it declined appt

## 2016-02-18 NOTE — Telephone Encounter (Signed)
appt 10/13

## 2016-02-18 NOTE — Telephone Encounter (Signed)
Pt returning call to nurse

## 2016-02-18 NOTE — Telephone Encounter (Signed)
Patient has to be seen in the clinic. Thanks.

## 2016-02-19 ENCOUNTER — Encounter: Payer: Self-pay | Admitting: Internal Medicine

## 2016-02-19 ENCOUNTER — Ambulatory Visit (INDEPENDENT_AMBULATORY_CARE_PROVIDER_SITE_OTHER): Payer: Managed Care, Other (non HMO) | Admitting: Internal Medicine

## 2016-02-19 VITALS — BP 135/60 | HR 90 | Temp 98.2°F | Wt 138.6 lb

## 2016-02-19 DIAGNOSIS — R232 Flushing: Secondary | ICD-10-CM | POA: Diagnosis not present

## 2016-02-19 MED ORDER — GABAPENTIN 300 MG PO CAPS: 300.0000 mg | ORAL_CAPSULE | Freq: Every day | ORAL | 2 refills | Status: DC

## 2016-02-19 MED ORDER — CITALOPRAM HYDROBROMIDE 10 MG PO TABS: 10.0000 mg | ORAL_TABLET | Freq: Every day | ORAL | 2 refills | Status: DC

## 2016-02-19 NOTE — Patient Instructions (Addendum)
Brooke Floyd it was nice seeing you today.  -Start taking Citalopram as instructed  -Start taking Gabapentin as instructed  -Return for a follow-up visit in 1 month

## 2016-02-20 LAB — TSH: TSH: 1.63 u[IU]/mL (ref 0.450–4.500)

## 2016-02-20 NOTE — Progress Notes (Signed)
   CC: Patient is complaining of hot flashes and trouble staying asleep.  HPI:  [name] is a 50 y.o. female with a past medical history of conditions listed below presenting to the clinic complaining of hot flashes and trouble staying asleep. Please see problem based charting for the status of the patient's current and chronic medical conditions.   Past Medical History:  Diagnosis Date  . GERD (gastroesophageal reflux disease)   . Hypertension   . Other symptoms involving nervous and musculoskeletal systems(781.99)   . Type II or unspecified type diabetes mellitus without mention of complication, not stated as uncontrolled    takes insulin  . Unspecified asthma, with exacerbation     Review of Systems:  Pertinent positives mentioned in HPI. Remainder of all ROS negative.   Physical Exam:  Vitals:   02/19/16 1658  BP: 135/60  Pulse: 90  Temp: 98.2 F (36.8 C)  TempSrc: Oral  SpO2: 96%  Weight: 138 lb 9.6 oz (62.9 kg)   Physical Exam  Constitutional: She is oriented to person, place, and time. She appears well-developed and well-nourished. No distress.  HENT:  Head: Normocephalic and atraumatic.  Eyes: EOM are normal. Right eye exhibits no discharge. Left eye exhibits no discharge.  Neck: Neck supple. No tracheal deviation present.  Cardiovascular: Normal rate, regular rhythm and intact distal pulses.   Pulmonary/Chest: Effort normal and breath sounds normal. No respiratory distress.  Abdominal: Soft. Bowel sounds are normal. She exhibits no distension. There is no tenderness. There is no guarding.  Musculoskeletal: Normal range of motion. She exhibits no edema or deformity.  Neurological: She is alert and oriented to person, place, and time.  Skin: Skin is warm and dry.    Assessment & Plan:   See Encounters Tab for problem based charting.  Patient discussed with Dr. Angelia Mould

## 2016-02-20 NOTE — Assessment & Plan Note (Signed)
History of present illness Patient reports having hot flashes for the past 6 months to a year. Reports having symptoms both during the day and at night; worse at night. States she has trouble staying asleep due to hot flashes. Denies consuming any caffeinated beverages on a daily basis. Denies having any heart palpitations or weight loss. Denies having any hair or skin changes. States she had a partial hysterectomy done at age 50 and one of her ovaries were removed in the past because of a "cyst."  Assessment Hot flashes likely secondary to menopause. TSH checked at this visit is normal.  Plan -Citalopram 10 mg daily for hot flashes -Gabapentin 300 mg at bedtime for hot flashes (nightime symptoms)

## 2016-02-23 NOTE — Progress Notes (Signed)
Internal Medicine Clinic Attending  Case discussed with Dr. Rathoreat the time of the visit. We reviewed the resident's history and exam and pertinent patient test results. I agree with the assessment, diagnosis, and plan of care documented in the resident's note.  

## 2016-03-03 ENCOUNTER — Other Ambulatory Visit: Payer: Self-pay | Admitting: *Deleted

## 2016-03-03 ENCOUNTER — Telehealth: Payer: Self-pay

## 2016-03-03 MED ORDER — GABAPENTIN 300 MG PO CAPS: 600.0000 mg | ORAL_CAPSULE | Freq: Every day | ORAL | 2 refills | Status: DC

## 2016-03-03 NOTE — Telephone Encounter (Signed)
Spoke to dr Marlowe Sax she will review chart and message at 5pm

## 2016-03-03 NOTE — Telephone Encounter (Signed)
Needs to speak with a nurse regarding  gabapentin (NEURONTIN) 300 MG capsule

## 2016-03-03 NOTE — Telephone Encounter (Signed)
Pt states she has taken 1 gabapentin and 2 benadryl and it does not work so she took 2 gabapentin and 2 benadryl and it works Recruitment consultant, no adverse effects such as sleepiness, cloudy thinking, sluggishness

## 2016-03-04 NOTE — Telephone Encounter (Signed)
[place] - stated rx was not received even though it was electronic sent yesterday. Verbal order given.

## 2016-03-04 NOTE — Telephone Encounter (Signed)
Requesting gabapentin to be filled @ [facility]

## 2016-03-07 NOTE — Telephone Encounter (Signed)
Called pt 4 days ago and gave her dr rathore's message

## 2016-03-10 ENCOUNTER — Telehealth: Payer: Self-pay | Admitting: Internal Medicine

## 2016-03-10 MED ORDER — BUDESONIDE-FORMOTEROL FUMARATE 160-4.5 MCG/ACT IN AERO
2.0000 | INHALATION_SPRAY | Freq: Two times a day (BID) | RESPIRATORY_TRACT | 12 refills | Status: DC
Start: 2016-03-10 — End: 2016-06-28

## 2016-03-10 NOTE — Telephone Encounter (Signed)
Called spoke with patient to verify that her Ruthe Mannan is no longer covered - covered alternatives at Advair, Breo and Symbicort.  Informed pt that message will be routed to [facility] for recs.  Patient requested Rx be [facility] to [facility] [address].  Checked with patient to ensure she has enough Dulera to last until tomorrow > she does NOT.  She has been out of her Dulera x1 week and has been using her rescue inhaler.  Dr Annamaria Boots please advise on alternative to [facility]  Thank you!  Last ov 7.24.17 w/ CDY: Patient Instructions  Depo 80    Dx asthma moderate intermittent   Scripts sent refilling Dulera and albuterol rescue inhaler   Script sent refilling singulair   Allergies  Allergen Reactions  . Diclofenac Sodium Itching  . Penicillins Other (See Comments)    convulsions  . Codeine Itching

## 2016-03-10 NOTE — Telephone Encounter (Signed)
Called spoke with patient and informed her of CY's recommendations Pt voiced her understanding Rx sent to verified pharmacy Nothing further needed at this time; will sign off

## 2016-03-10 NOTE — Telephone Encounter (Signed)
Recommend Symbicort 160, # 1,  Inhale 2 puffs then rinse mouth, twice daily,   Refill x 12

## 2016-03-21 ENCOUNTER — Telehealth: Payer: Self-pay | Admitting: Internal Medicine

## 2016-03-21 NOTE — Telephone Encounter (Signed)
APT. REMINDER CALL, LMTCB °

## 2016-03-22 ENCOUNTER — Encounter: Payer: Managed Care, Other (non HMO) | Admitting: Internal Medicine

## 2016-03-28 ENCOUNTER — Telehealth: Payer: Self-pay | Admitting: Internal Medicine

## 2016-03-28 NOTE — Telephone Encounter (Signed)
APT. REMINDER CALL, LMTCB °

## 2016-03-29 ENCOUNTER — Encounter: Payer: Self-pay | Admitting: Internal Medicine

## 2016-03-29 ENCOUNTER — Ambulatory Visit (INDEPENDENT_AMBULATORY_CARE_PROVIDER_SITE_OTHER): Payer: Managed Care, Other (non HMO) | Admitting: Internal Medicine

## 2016-03-29 VITALS — BP 140/85 | HR 77 | Temp 98.4°F | Ht 62.0 in | Wt 139.9 lb

## 2016-03-29 DIAGNOSIS — E1165 Type 2 diabetes mellitus with hyperglycemia: Secondary | ICD-10-CM

## 2016-03-29 DIAGNOSIS — G4700 Insomnia, unspecified: Secondary | ICD-10-CM

## 2016-03-29 DIAGNOSIS — N951 Menopausal and female climacteric states: Secondary | ICD-10-CM | POA: Diagnosis not present

## 2016-03-29 DIAGNOSIS — G2581 Restless legs syndrome: Secondary | ICD-10-CM | POA: Diagnosis not present

## 2016-03-29 DIAGNOSIS — Z79899 Other long term (current) drug therapy: Secondary | ICD-10-CM

## 2016-03-29 DIAGNOSIS — E119 Type 2 diabetes mellitus without complications: Secondary | ICD-10-CM

## 2016-03-29 DIAGNOSIS — Z794 Long term (current) use of insulin: Secondary | ICD-10-CM | POA: Diagnosis not present

## 2016-03-29 DIAGNOSIS — E785 Hyperlipidemia, unspecified: Secondary | ICD-10-CM | POA: Diagnosis not present

## 2016-03-29 DIAGNOSIS — I10 Essential (primary) hypertension: Secondary | ICD-10-CM

## 2016-03-29 DIAGNOSIS — R232 Flushing: Secondary | ICD-10-CM

## 2016-03-29 DIAGNOSIS — Z23 Encounter for immunization: Secondary | ICD-10-CM | POA: Diagnosis not present

## 2016-03-29 DIAGNOSIS — Z0000 Encounter for general adult medical examination without abnormal findings: Secondary | ICD-10-CM

## 2016-03-29 LAB — POCT GLYCOSYLATED HEMOGLOBIN (HGB A1C): HEMOGLOBIN A1C: 7.8

## 2016-03-29 LAB — GLUCOSE, CAPILLARY: GLUCOSE-CAPILLARY: 179 mg/dL — AB (ref 65–99)

## 2016-03-29 MED ORDER — SITAGLIPTIN PHOSPHATE 100 MG PO TABS: 100.0000 mg | ORAL_TABLET | Freq: Every day | ORAL | 1 refills | Status: DC

## 2016-03-29 MED ORDER — INSULIN GLARGINE 100 UNIT/ML SOLOSTAR PEN: 40.0000 [IU] | PEN_INJECTOR | Freq: Every day | SUBCUTANEOUS | 6 refills | Status: DC

## 2016-03-29 NOTE — Patient Instructions (Signed)
Ms. Drapkin it was nice seeing you today.  -Lantus dose has been decreased: use 40 units at bedtime  -Start taking Januvia as instructed  -Return for a follow-up visit in 3 months.

## 2016-03-30 LAB — LIPID PANEL
CHOL/HDL RATIO: 2.9 ratio (ref 0.0–4.4)
Cholesterol, Total: 156 mg/dL (ref 100–199)
HDL: 54 mg/dL (ref >39)
LDL Calculated: 83 mg/dL (ref 0–99)
Triglycerides: 95 mg/dL (ref 0–149)
VLDL Cholesterol Cal: 19 mg/dL (ref 5–40)

## 2016-03-30 LAB — BMP8+ANION GAP
Anion Gap: 18 mmol/L (ref 10.0–18.0)
BUN/Creatinine Ratio: 22 (ref 9–23)
BUN: 15 mg/dL (ref 6–24)
CALCIUM: 9.8 mg/dL (ref 8.7–10.2)
CHLORIDE: 101 mmol/L (ref 96–106)
CO2: 24 mmol/L (ref 18–29)
Creatinine, Ser: 0.67 mg/dL (ref 0.57–1.00)
GFR calc non Af Amer: 103 mL/min/{1.73_m2} (ref >59)
GFR, EST AFRICAN AMERICAN: 119 mL/min/{1.73_m2} (ref >59)
GLUCOSE: 217 mg/dL — AB (ref 65–99)
POTASSIUM: 4.1 mmol/L (ref 3.5–5.2)
Sodium: 143 mmol/L (ref 134–144)

## 2016-03-31 NOTE — Assessment & Plan Note (Signed)
Lab Results  Component Value Date   HGBA1C 7.8 03/29/2016   HGBA1C 8.3 12/15/2015   HGBA1C 8.5 09/29/2015     Assessment: Diabetes control:  poorly controlled (above goal A1c <7) Progress toward A1C goal:   improved  Comments: Currently taking Glipizide 10 mg daily, Lantus 50 units qhs, and Metformin 1000 mg BID. States she has reduced fried foods in her diet and wants to eventually come off of insulin. She is motivated to begin an exercise program soon. Meter shows she is checking her blood glucose once daily - avg 133, range 53-241. She has 2 low CBGs in the 50s early in the morning (2:40 am and 4:01 am). Patient states she eats dinner early by 5-6 pm and always eats a snack later at night before bedtime. States these 2 times she forgot to eat her snack and noticed her blood glucose dropped early in the morning. States her last eye exam was in July 2017 and she is scheduled to see her ophthamologist on 04/04/16.   Plan: Medications:  Decrease Lantus to 40 units qhs. Start Januvia 100 mg daily. Continue Glipizide and Metformin as above.  Home glucose monitoring: Frequency:  1-2x/ day Timing:  before meals  Instruction/counseling given: reminded to get eye exam, reminded to bring blood glucose meter & log to each visit, reminded to bring medications to each visit, discussed foot care and discussed diet Other plans:  -Repeat A1c in 3 months

## 2016-03-31 NOTE — Assessment & Plan Note (Signed)
A Denies having any trouble sleeping since she started using a new pillow. States the recent increase in the dose of her Gabapentin (taking for hot flashes) also helped with her sleep.  P -CTM

## 2016-03-31 NOTE — Assessment & Plan Note (Signed)
Tdap and influenza vaccines administered at this visit.

## 2016-03-31 NOTE — Progress Notes (Signed)
   CC: Pt is here to discuss HTN, HLD, DM2, restless leg syndrome, and hot flashes.   HPI:  [name] is a 50 y.o. F with a  PMHx of conditions listed below presenting to the clinic to discuss HTN, HLD, DM2, restless leg syndrome, and hot flashes.   Past Medical History:  Diagnosis Date  . GERD (gastroesophageal reflux disease)   . Hypertension   . Other symptoms involving nervous and musculoskeletal systems(781.99)   . Type II or unspecified type diabetes mellitus without mention of complication, not stated as uncontrolled    takes insulin  . Unspecified asthma, with exacerbation     Review of Systems:  Pertinent positives mentioned in HPI. Remainder of all ROS negative.   Physical Exam:  Vitals:   03/29/16 1513  BP: 140/85  Pulse: 77  Temp: 98.4 F (36.9 C)  TempSrc: Oral  SpO2: 99%  Weight: 139 lb 14.4 oz (63.5 kg)  Height: 5\' 2"  (1.575 m)   Physical Exam  Constitutional: She is oriented to person, place, and time. She appears well-developed and well-nourished. No distress.  HENT:  Mouth/Throat: Oropharynx is clear and moist.  Eyes: EOM are normal.  Neck: Neck supple. No tracheal deviation present.  Cardiovascular: Normal rate, regular rhythm and intact distal pulses.   DP pulse: 1+ bilaterally. Capillary refill <2 sec.   Pulmonary/Chest: Effort normal and breath sounds normal. No respiratory distress.  Abdominal: Soft. Bowel sounds are normal. She exhibits no distension. There is no tenderness. There is no guarding.  Musculoskeletal: Normal range of motion. She exhibits no edema.  Neurological: She is alert and oriented to person, place, and time.  Skin: Skin is warm and dry.    Assessment & Plan:   See Encounters Tab for problem based charting.  Patient discussed with Dr. Daryll Drown

## 2016-03-31 NOTE — Assessment & Plan Note (Signed)
BP Readings from Last 3 Encounters:  03/29/16 140/85  02/19/16 135/60  01/18/16 (!) 148/75    Lab Results  Component Value Date   NA 143 03/29/2016   K 4.1 03/29/2016   CREATININE 0.67 03/29/2016    Assessment: Blood pressure control:  at goal Comments: Currently taking HCTZ 12.5 mg daily and Quinapril 40 mg daily. She is motivated to start a new exercise program and has cut down on the amount of fried foods from her diet.   Plan: Medications:  continue current medications Educational resources provided:   Educated patient about healthy eating and exercise.

## 2016-03-31 NOTE — Assessment & Plan Note (Signed)
A Currently taking Mirapex 0.5 mg qhs. States her symptoms are improved, although she continues to have occasional cramping in legs at rest. Arterial insufficiency less likely as patient denies any leg pain/ discomfort with walking, LE pulses intact, and normal capillary refill time. Venous insufficiency less likely as patient does not have any LE edema or skin changes. B12 deficiency less likely as she does not have any leg weakness or paresthesias. TSH checked 02/2016 was normal. Serum K checked at this visit normal (4.1).   P -CTM

## 2016-03-31 NOTE — Assessment & Plan Note (Signed)
A Currently taking Citalopram 10 mg daily and Gabapentin 600 mg qhs. States her symptoms are much improved.   P -Continue current mgmt

## 2016-03-31 NOTE — Assessment & Plan Note (Signed)
A Currently taking simvastatin 80 mg daily. Lipid panel at this visit showing chol 156, HDL 54, and LDL 83. Her 10 year ASCVD risk score is 9.3%.   P -Continue current mgmt

## 2016-04-04 NOTE — Progress Notes (Signed)
Internal Medicine Clinic Attending  Case discussed with Dr. Rathore soon after the resident saw the patient.  We reviewed the resident's history and exam and pertinent patient test results.  I agree with the assessment, diagnosis, and plan of care documented in the resident's note.  

## 2016-04-05 ENCOUNTER — Telehealth: Payer: Self-pay | Admitting: Internal Medicine

## 2016-04-05 NOTE — Telephone Encounter (Signed)
Spoke to the patient over the phone and discussed lab results with her. Thanks. 

## 2016-04-05 NOTE — Telephone Encounter (Signed)
Pt is returning your call

## 2016-04-23 ENCOUNTER — Other Ambulatory Visit: Payer: Self-pay | Admitting: Internal Medicine

## 2016-05-04 ENCOUNTER — Telehealth: Payer: Self-pay | Admitting: Internal Medicine

## 2016-05-04 NOTE — Telephone Encounter (Signed)
Patient returned phone call.Brooke Floyd ° °

## 2016-05-04 NOTE — Telephone Encounter (Signed)
lmtcb for pt.  

## 2016-05-04 NOTE — Telephone Encounter (Signed)
lmtcb X1 

## 2016-05-06 ENCOUNTER — Telehealth: Payer: Self-pay | Admitting: Internal Medicine

## 2016-05-06 MED ORDER — BENZONATATE 200 MG PO CAPS: 200.0000 mg | ORAL_CAPSULE | Freq: Three times a day (TID) | ORAL | 0 refills | Status: DC | PRN

## 2016-05-06 MED ORDER — AZITHROMYCIN 250 MG PO TABS: ORAL_TABLET | ORAL | 0 refills | Status: DC

## 2016-05-06 NOTE — Telephone Encounter (Signed)
lmomtcb x1 

## 2016-05-06 NOTE — Telephone Encounter (Signed)
Offer Zpak,  2 today then one daily            Tessalon perles 200 mg, # 30,  1 every 8 hours if needed for cough

## 2016-05-06 NOTE — Telephone Encounter (Signed)
Pt called back and she is aware of CY recs.  meds have been sent to the pharmacy.  Pt is aware and nothing further is needed.

## 2016-05-06 NOTE — Telephone Encounter (Signed)
Called and spoke with pt and she stated that she is having nasal congestion with brown secretions and a cough that is non productive.  Pt stated that she has been using the theraflu and feels a little better.  She is still coughing a lot at night and feels that she may need abx and cough meds.  CY please advise. Thanks  Allergies  Allergen Reactions  . Diclofenac Sodium Itching  . Penicillins Other (See Comments)    convulsions  . Codeine Itching   Last ov--11/30/15 Next ov--no pending appts.

## 2016-05-06 NOTE — Telephone Encounter (Signed)
Spoke with pharmacist, zpak was sent in incorrectly.  This has since been fixed and filled appropriately.  Nothing further needed.

## 2016-05-15 ENCOUNTER — Other Ambulatory Visit: Payer: Self-pay | Admitting: Internal Medicine

## 2016-05-29 ENCOUNTER — Other Ambulatory Visit: Payer: Self-pay | Admitting: Internal Medicine

## 2016-05-31 ENCOUNTER — Other Ambulatory Visit: Payer: Self-pay | Admitting: Internal Medicine

## 2016-06-28 ENCOUNTER — Ambulatory Visit (INDEPENDENT_AMBULATORY_CARE_PROVIDER_SITE_OTHER): Payer: Managed Care, Other (non HMO) | Admitting: Internal Medicine

## 2016-06-28 ENCOUNTER — Encounter (INDEPENDENT_AMBULATORY_CARE_PROVIDER_SITE_OTHER): Payer: Self-pay

## 2016-06-28 ENCOUNTER — Encounter: Payer: Self-pay | Admitting: Internal Medicine

## 2016-06-28 VITALS — BP 136/66 | HR 97 | Temp 98.2°F | Ht 62.0 in | Wt 139.1 lb

## 2016-06-28 DIAGNOSIS — E118 Type 2 diabetes mellitus with unspecified complications: Secondary | ICD-10-CM | POA: Diagnosis not present

## 2016-06-28 DIAGNOSIS — Z90710 Acquired absence of both cervix and uterus: Secondary | ICD-10-CM | POA: Diagnosis not present

## 2016-06-28 DIAGNOSIS — Z90721 Acquired absence of ovaries, unilateral: Secondary | ICD-10-CM

## 2016-06-28 DIAGNOSIS — Z79899 Other long term (current) drug therapy: Secondary | ICD-10-CM | POA: Diagnosis not present

## 2016-06-28 DIAGNOSIS — Z794 Long term (current) use of insulin: Secondary | ICD-10-CM

## 2016-06-28 DIAGNOSIS — N950 Postmenopausal bleeding: Secondary | ICD-10-CM

## 2016-06-28 DIAGNOSIS — N951 Menopausal and female climacteric states: Secondary | ICD-10-CM | POA: Diagnosis not present

## 2016-06-28 DIAGNOSIS — Z0000 Encounter for general adult medical examination without abnormal findings: Secondary | ICD-10-CM

## 2016-06-28 DIAGNOSIS — R232 Flushing: Secondary | ICD-10-CM | POA: Diagnosis not present

## 2016-06-28 DIAGNOSIS — I10 Essential (primary) hypertension: Secondary | ICD-10-CM | POA: Diagnosis not present

## 2016-06-28 DIAGNOSIS — E119 Type 2 diabetes mellitus without complications: Secondary | ICD-10-CM

## 2016-06-28 LAB — POCT GLYCOSYLATED HEMOGLOBIN (HGB A1C): Hemoglobin A1C: 7.2

## 2016-06-28 LAB — GLUCOSE, CAPILLARY: Glucose-Capillary: 103 mg/dL — ABNORMAL HIGH (ref 65–99)

## 2016-06-28 MED ORDER — INSULIN GLARGINE 100 UNIT/ML SOLOSTAR PEN: 30.0000 [IU] | PEN_INJECTOR | Freq: Every day | SUBCUTANEOUS | 3 refills | Status: DC

## 2016-06-28 NOTE — Patient Instructions (Signed)
Brooke Floyd it was nice seeing you today.  -Use Lantus 30 units at bedtime.  -Continue taking your other diabetes medications as before.   -Return for a follow-up visit in 1 month with your meter.

## 2016-06-29 DIAGNOSIS — N950 Postmenopausal bleeding: Secondary | ICD-10-CM | POA: Insufficient documentation

## 2016-06-29 NOTE — Assessment & Plan Note (Signed)
BP Readings from Last 3 Encounters:  06/28/16 136/66  03/29/16 140/85  02/19/16 135/60    Lab Results  Component Value Date   NA 143 03/29/2016   K 4.1 03/29/2016   CREATININE 0.67 03/29/2016    Assessment: Blood pressure control:  well controlled  Comments: Currently on HCTZ 12.5 mg daily and Quinapril 40 mg daily.   Plan: Medications:  continue current medications Educational resources provided:   Educated patient about healthy eating and exercise. Emphasized the importance of weight loss.

## 2016-06-29 NOTE — Assessment & Plan Note (Signed)
Referral for a screening colonoscopy.

## 2016-06-29 NOTE — Assessment & Plan Note (Signed)
Pt stopped taking Citalopram 2 months ago because she was having pruritis. States the pruritis resolved after she stopped taking citalopram. States her symptoms are currently well controlled with Gabapentin only.

## 2016-06-29 NOTE — Assessment & Plan Note (Signed)
Lab Results  Component Value Date   HGBA1C 7.2 06/28/2016   HGBA1C 7.8 03/29/2016   HGBA1C 8.3 12/15/2015     Assessment: Diabetes control:  slightly above goal Progress toward A1C goal:   improved  Comments: Currently on Lantus 40 u qhs, Januvia 100 mg QD, Glipizide 10 mg QD, and Metformin 1000 mg BID. Meter showing avg 127, highest 366, and lowest 49 at approx. 4 am. Pt remembers eating candy the day her blood glucose was high. Pt reports having several episodes of hypoglycemic symptoms very early in the morning.   Plan: Medications: Decrease dose of basal insulin (Lantus 30 u qhs). Cont Januvia, Glipizide, and Metformin as above.  Home glucose monitoring: Frequency:  3x per day Timing:  before meals and when symptomatic  Instruction/counseling given: reminded to get eye exam, reminded to bring blood glucose meter & log to each visit, reminded to bring medications to each visit, discussed foot care, discussed the need for weight loss and discussed diet Other plans:  -Return to the clinic in 1 month with meter

## 2016-06-29 NOTE — Progress Notes (Signed)
   CC: Pt is here to discuss HTN and diabetes.  HPI:  [name] is a 51 y.o. F with a PMHx of conditions listed below presenting to the clinic to discuss HTN and diabetes. Also reports having an episode of vaginal bleeding 2 months ago. Please see problem based charting for the status of the patient's current and chronic medical conditions.    Past Medical History:  Diagnosis Date  . GERD (gastroesophageal reflux disease)   . Hypertension   . Other symptoms involving nervous and musculoskeletal systems(781.99)   . Type II or unspecified type diabetes mellitus without mention of complication, not stated as uncontrolled    takes insulin  . Unspecified asthma, with exacerbation     Review of Systems:  Pertinent positives mentioned in HPI. Remainder of all ROS negative.   Physical Exam:  Vitals:   06/28/16 1513  BP: 136/66  Pulse: 97  Temp: 98.2 F (36.8 C)  TempSrc: Oral  SpO2: 99%  Weight: 139 lb 1.6 oz (63.1 kg)  Height: 5\' 2"  (1.575 m)   Physical Exam  Constitutional: She is oriented to person, place, and time. She appears well-developed and well-nourished. No distress.  HENT:  Head: Normocephalic and atraumatic.  Eyes: EOM are normal.  Neck: Neck supple. No tracheal deviation present.  Cardiovascular: Normal rate, regular rhythm and intact distal pulses.  Exam reveals no gallop and no friction rub.   Pulmonary/Chest: Effort normal and breath sounds normal. No respiratory distress. She has no wheezes. She has no rales.  Abdominal: Soft. Bowel sounds are normal. She exhibits no distension. There is no tenderness.  Musculoskeletal: Normal range of motion. She exhibits no edema.  Neurological: She is alert and oriented to person, place, and time.  Skin: Skin is warm and dry.  Psychiatric:  Seems disinterested. Playing a game on her ipad the entire visit. Not maintaining eye contact.     Assessment & Plan:   See Encounters Tab for problem based  charting.  Patient discussed with Dr. Evette Doffing

## 2016-06-29 NOTE — Assessment & Plan Note (Signed)
A Pt reports having an episode of post-menopausal menstrual bleeding 2 months ago. States the bleeding lasted for a few hours and resolved by the end of the same day. Also reports having associated abdominal cramping that day. Denies having any further episodes of menstrual bleeding since then. She had a hysterectomy and R oophorectomy done in the early 2000s.  P -Will monitor for now since she just had 1 episode of post-menopausal bleeding -Patient has been advised to inform me if she has any more episodes of vaginal bleeding. She will need a pelvic exam and possible gyn referral in that case.

## 2016-06-30 ENCOUNTER — Other Ambulatory Visit: Payer: Self-pay | Admitting: Internal Medicine

## 2016-06-30 ENCOUNTER — Other Ambulatory Visit: Payer: Self-pay | Admitting: *Deleted

## 2016-06-30 DIAGNOSIS — E119 Type 2 diabetes mellitus without complications: Secondary | ICD-10-CM

## 2016-06-30 MED ORDER — INSULIN GLARGINE 100 UNIT/ML SOLOSTAR PEN: 30.0000 [IU] | PEN_INJECTOR | Freq: Every day | SUBCUTANEOUS | 3 refills | Status: DC

## 2016-06-30 NOTE — Progress Notes (Signed)
Internal Medicine Clinic Attending  Case discussed with Dr. Rathoreat the time of the visit. We reviewed the resident's history and exam and pertinent patient test results. I agree with the assessment, diagnosis, and plan of care documented in the resident's note.  

## 2016-06-30 NOTE — Telephone Encounter (Signed)
Resending you rx for lantus. Pharmacy requesting additional modifications on the order. Order revised for it to be covered by insurance. (added "1 box=30 day supply on the patient sig) . Pls sign, thanks!

## 2016-07-01 ENCOUNTER — Other Ambulatory Visit: Payer: Self-pay | Admitting: Internal Medicine

## 2016-07-01 DIAGNOSIS — Z1231 Encounter for screening mammogram for malignant neoplasm of breast: Secondary | ICD-10-CM

## 2016-07-04 ENCOUNTER — Telehealth: Payer: Self-pay | Admitting: *Deleted

## 2016-07-04 NOTE — Telephone Encounter (Signed)
Fax from [employer].  Lantus is not on formulary and company is requesting a change to [employer], Levemir or Tyler Aas which are covered alternatives..  Message to be sent to patient's PCP Dr. Marlowe Sax.  Sander Nephew, RN 07/04/2016 2:00 PM

## 2016-07-12 MED ORDER — INSULIN DETEMIR 100 UNIT/ML FLEXPEN: 30.0000 [IU] | PEN_INJECTOR | Freq: Every day | SUBCUTANEOUS | 3 refills | Status: DC

## 2016-07-14 ENCOUNTER — Encounter: Payer: Self-pay | Admitting: Gastroenterology

## 2016-07-14 ENCOUNTER — Telehealth: Payer: Self-pay | Admitting: Internal Medicine

## 2016-07-14 NOTE — Telephone Encounter (Signed)
Spoke with pt. She states that she was actually returning a phone call to GI not Pulmonary. Message will be closed. Nothing further was needed.

## 2016-07-20 ENCOUNTER — Ambulatory Visit
Admission: RE | Admit: 2016-07-20 | Discharge: 2016-07-20 | Disposition: A | Discharge status: Home / Self Care | Payer: Managed Care, Other (non HMO) | Source: Ambulatory Visit | Attending: Family Medicine | Admitting: Family Medicine

## 2016-07-20 DIAGNOSIS — Z1231 Encounter for screening mammogram for malignant neoplasm of breast: Secondary | ICD-10-CM

## 2016-07-26 ENCOUNTER — Telehealth: Payer: Self-pay | Admitting: Internal Medicine

## 2016-07-26 NOTE — Telephone Encounter (Signed)
Apt. Reminder call, no answer, voicemail full

## 2016-07-27 ENCOUNTER — Ambulatory Visit: Payer: Managed Care, Other (non HMO)

## 2016-08-18 NOTE — Telephone Encounter (Signed)
Brooke Floyd can you please close this enounter

## 2016-09-12 ENCOUNTER — Encounter: Payer: Managed Care, Other (non HMO) | Admitting: Gastroenterology

## 2016-09-14 ENCOUNTER — Encounter: Payer: Managed Care, Other (non HMO) | Admitting: Gastroenterology

## 2016-09-19 ENCOUNTER — Ambulatory Visit (INDEPENDENT_AMBULATORY_CARE_PROVIDER_SITE_OTHER): Payer: Managed Care, Other (non HMO) | Admitting: *Deleted

## 2016-09-19 DIAGNOSIS — Z0000 Encounter for general adult medical examination without abnormal findings: Secondary | ICD-10-CM

## 2016-09-20 ENCOUNTER — Encounter: Payer: Managed Care, Other (non HMO) | Admitting: Internal Medicine

## 2016-09-21 DIAGNOSIS — Z111 Encounter for screening for respiratory tuberculosis: Secondary | ICD-10-CM | POA: Diagnosis not present

## 2016-09-21 LAB — TB SKIN TEST
Induration: 0 mm
TB Skin Test: NEGATIVE

## 2016-09-25 ENCOUNTER — Other Ambulatory Visit: Payer: Self-pay | Admitting: Internal Medicine

## 2016-09-25 DIAGNOSIS — E119 Type 2 diabetes mellitus without complications: Secondary | ICD-10-CM

## 2016-09-27 NOTE — Telephone Encounter (Signed)
sitaGLIPtin (JANUVIA) 100 MG tablet, refill request @ [facility]. Want 90 days supply.

## 2016-09-27 NOTE — Telephone Encounter (Signed)
Call from pt-still waiting on Januvia refill.  Request sent to pcp for review.Brooke Hidden Cassady5/22/20184:20 PM

## 2016-10-05 ENCOUNTER — Other Ambulatory Visit: Payer: Self-pay | Admitting: *Deleted

## 2016-10-05 DIAGNOSIS — G2581 Restless legs syndrome: Secondary | ICD-10-CM

## 2016-10-10 MED ORDER — PRAMIPEXOLE DIHYDROCHLORIDE 0.5 MG PO TABS: 0.5000 mg | ORAL_TABLET | Freq: Every day | ORAL | 0 refills | Status: DC

## 2016-10-11 ENCOUNTER — Ambulatory Visit (INDEPENDENT_AMBULATORY_CARE_PROVIDER_SITE_OTHER): Payer: Managed Care, Other (non HMO) | Admitting: Internal Medicine

## 2016-10-11 VITALS — BP 132/72 | HR 82 | Temp 98.1°F | Ht 62.0 in | Wt 136.7 lb

## 2016-10-11 DIAGNOSIS — Z0000 Encounter for general adult medical examination without abnormal findings: Secondary | ICD-10-CM

## 2016-10-11 DIAGNOSIS — Z794 Long term (current) use of insulin: Secondary | ICD-10-CM | POA: Diagnosis not present

## 2016-10-11 DIAGNOSIS — Z79899 Other long term (current) drug therapy: Secondary | ICD-10-CM | POA: Diagnosis not present

## 2016-10-11 DIAGNOSIS — I10 Essential (primary) hypertension: Secondary | ICD-10-CM

## 2016-10-11 DIAGNOSIS — E119 Type 2 diabetes mellitus without complications: Secondary | ICD-10-CM | POA: Diagnosis not present

## 2016-10-11 DIAGNOSIS — N950 Postmenopausal bleeding: Secondary | ICD-10-CM

## 2016-10-11 LAB — POCT GLYCOSYLATED HEMOGLOBIN (HGB A1C): Hemoglobin A1C: 6.6

## 2016-10-11 LAB — GLUCOSE, CAPILLARY: GLUCOSE-CAPILLARY: 116 mg/dL — AB (ref 65–99)

## 2016-10-11 MED ORDER — METFORMIN HCL 1000 MG PO TABS: ORAL_TABLET | ORAL | 3 refills | Status: DC

## 2016-10-11 MED ORDER — INSULIN DETEMIR 100 UNIT/ML FLEXPEN: 30.0000 [IU] | PEN_INJECTOR | Freq: Every day | SUBCUTANEOUS | 3 refills | Status: DC

## 2016-10-11 NOTE — Patient Instructions (Signed)
Brooke Floyd it was nice seeing you today.  Continue using Levemir 30 units at bedtime.  Continue using Januvia, glipizide, and metformin as before  Return for follow-up visit in 3 months.

## 2016-10-12 LAB — HIV ANTIBODY (ROUTINE TESTING W REFLEX): HIV Screen 4th Generation wRfx: NONREACTIVE

## 2016-10-12 NOTE — Progress Notes (Signed)
   CC: Patient is here to discuss hypertension and diabetes.  HPI:  Ms.Larayne Jerilynn Mages Rachels is a 51 y.o. female with a past medical history of conditions listed below presenting to the clinic to discuss hypertension and diabetes. Please see problem based charting for the status of the patient's current and chronic medical conditions.   Past Medical History:  Diagnosis Date  . GERD (gastroesophageal reflux disease)   . Hypertension   . Other symptoms involving nervous and musculoskeletal systems(781.99)   . Type II or unspecified type diabetes mellitus without mention of complication, not stated as uncontrolled    takes insulin  . Unspecified asthma, with exacerbation     Review of Systems:   Review of Systems  Constitutional: Negative for chills and fever.  Respiratory: Negative for shortness of breath and wheezing.   Cardiovascular: Negative for chest pain and leg swelling.  Gastrointestinal: Negative for abdominal pain, nausea and vomiting.    Physical Exam:  Vitals:   10/11/16 1549  BP: 132/72  Pulse: 82  Temp: 98.1 F (36.7 C)  TempSrc: Oral  SpO2: 100%  Weight: 136 lb 11.2 oz (62 kg)  Height: 5\' 2"  (1.575 m)   Physical Exam  Constitutional: She is oriented to person, place, and time. She appears well-developed and well-nourished. No distress.  HENT:  Head: Normocephalic and atraumatic.  Eyes: Right eye exhibits no discharge. Left eye exhibits no discharge.  Neck: Neck supple. No tracheal deviation present.  Cardiovascular: Normal rate, regular rhythm and intact distal pulses.   Pulmonary/Chest: Effort normal and breath sounds normal. No respiratory distress. She has no wheezes. She has no rales.  Abdominal: Soft. Bowel sounds are normal. She exhibits no distension. There is no tenderness.  Musculoskeletal: She exhibits no edema.  Neurological: She is alert and oriented to person, place, and time.  Skin: Skin is warm and dry.    Assessment & Plan:   See  Encounters Tab for problem based charting.  Patient discussed with Dr. Dareen Piano

## 2016-10-12 NOTE — Assessment & Plan Note (Signed)
Assessment Resolved. Patient denies having any further episodes of vaginal bleeding.   Plan -No acute intervention needed at this time

## 2016-10-12 NOTE — Assessment & Plan Note (Signed)
BP Readings from Last 3 Encounters:  10/11/16 132/72  06/28/16 136/66  03/29/16 140/85    Lab Results  Component Value Date   NA 143 03/29/2016   K 4.1 03/29/2016   CREATININE 0.67 03/29/2016    Assessment: Blood pressure control:  well-controlled Comments: Current medication regimen includes hydrochlorothiazide 12.5 mg daily and quinapril 40 mg daily.  Plan: Medications:  continue current medications Educational resources provided:   Educated patient about healthy eating and exercise. Emphasized the importance of weight loss.

## 2016-10-12 NOTE — Assessment & Plan Note (Addendum)
Lab Results  Component Value Date   HGBA1C 6.6 10/11/2016   HGBA1C 7.2 06/28/2016   HGBA1C 7.8 03/29/2016     Assessment: Diabetes control:  well-controlled Comments: Current medication regimen includes Levemir 30 units at bedtime, Januvia 100 mg daily, glipizide 10 mg daily, and metformin 1000 mg twice daily. Patient reports taking Lantus 30 units at bedtime but sometimes changing the dose to 40 units if she had too much to eat during the day. I explained to her how basal insulin works and advised her to take this medication as instructed in the future. She did not bring her meter to this visit. States she has an appointment with her ophthalmologist on 01/27/2017.  Plan: Medications:  continue current medications Home glucose monitoring: Frequency:  2-3x times a day Timing:  before meals Instruction/counseling given: reminded to get eye exam, reminded to bring blood glucose meter & log to each visit, reminded to bring medications to each visit, discussed foot care, discussed the need for weight loss, discussed diet and discussed sick day management  Other plans:  -Advised patient to bring her meter to the next visit. Repeat A1c in 3 months.

## 2016-10-12 NOTE — Assessment & Plan Note (Signed)
HIV antibody checked at this visit nonreactive.

## 2016-10-14 NOTE — Progress Notes (Signed)
Internal Medicine Clinic Attending  Case discussed with Dr. Rathoreat the time of the visit. We reviewed the resident's history and exam and pertinent patient test results. I agree with the assessment, diagnosis, and plan of care documented in the resident's note.  

## 2016-11-10 NOTE — Telephone Encounter (Signed)
A user error has taken place: encounter opened in error, closed for administrative reasons.

## 2016-11-11 ENCOUNTER — Ambulatory Visit (AMBULATORY_SURGERY_CENTER): Payer: Self-pay | Admitting: *Deleted

## 2016-11-11 VITALS — Ht 62.0 in | Wt 136.0 lb

## 2016-11-11 DIAGNOSIS — Z1211 Encounter for screening for malignant neoplasm of colon: Secondary | ICD-10-CM

## 2016-11-11 MED ORDER — NA SULFATE-K SULFATE-MG SULF 17.5-3.13-1.6 GM/177ML PO SOLN: ORAL | 0 refills | Status: DC

## 2016-11-11 NOTE — Progress Notes (Signed)
Patient denies any allergies to eggs or soy. Patient denies any problems with anesthesia/sedation. Patient denies any oxygen use at home and does not take any diet/weight loss medications. EMMI education assisgned to patient on colonoscopy, this was explained and instructions given to patient. 

## 2016-11-23 ENCOUNTER — Encounter: Payer: Self-pay | Admitting: Gastroenterology

## 2016-11-23 ENCOUNTER — Ambulatory Visit (AMBULATORY_SURGERY_CENTER): Payer: Managed Care, Other (non HMO) | Admitting: Gastroenterology

## 2016-11-23 VITALS — BP 124/67 | HR 92 | Temp 96.8°F | Resp 11 | Ht 62.0 in | Wt 136.0 lb

## 2016-11-23 DIAGNOSIS — D122 Benign neoplasm of ascending colon: Secondary | ICD-10-CM | POA: Diagnosis not present

## 2016-11-23 DIAGNOSIS — Z1212 Encounter for screening for malignant neoplasm of rectum: Secondary | ICD-10-CM | POA: Diagnosis not present

## 2016-11-23 DIAGNOSIS — Z1211 Encounter for screening for malignant neoplasm of colon: Secondary | ICD-10-CM | POA: Diagnosis present

## 2016-11-23 MED ORDER — SODIUM CHLORIDE 0.9 % IV SOLN: 500.0000 mL | INTRAVENOUS | Status: DC

## 2016-11-23 NOTE — Progress Notes (Signed)
To PACU, vss patent aw report to rn 

## 2016-11-23 NOTE — Op Note (Signed)
[place] Patient Name: Brooke Floyd Procedure Date: 11/23/2016 9:48 AM MRN: [id number] Endoscopist: Mauri Pole , MD Age: 51 Referring MD:  Date of Birth: [birth year] Gender: Female Account #: [id number] Procedure:                Colonoscopy Indications:              Screening for colorectal malignant neoplasm, This                            is the patient's first colonoscopy Medicines:                Monitored Anesthesia Care Procedure:                Pre-Anesthesia Assessment:                           - Prior to the procedure, a History and Physical                            was performed, and patient medications and                            allergies were reviewed. The patient's tolerance of                            previous anesthesia was also reviewed. The risks                            and benefits of the procedure and the sedation                            options and risks were discussed with the patient.                            All questions were answered, and informed consent                            was obtained. Prior Anticoagulants: The patient has                            taken no previous anticoagulant or antiplatelet                            agents. ASA Grade Assessment: II - A patient with                            mild systemic disease. After reviewing the risks                            and benefits, the patient was deemed in                            satisfactory condition to undergo the procedure.  After obtaining informed consent, the colonoscope                            was passed under direct vision. Throughout the                            procedure, the patient's blood pressure, pulse, and                            oxygen saturations were monitored continuously. The                            Colonoscope was introduced through the anus and                            advanced to the the  cecum, identified by                            appendiceal orifice and ileocecal valve. The                            colonoscopy was performed without difficulty. The                            patient tolerated the procedure well. The quality                            of the bowel preparation was excellent. The                            ileocecal valve, appendiceal orifice, and rectum                            were photographed. Scope In: 9:52:03 AM Scope Out: 10:05:27 AM Scope Withdrawal Time: 0 hours 8 minutes 46 seconds  Total Procedure Duration: 0 hours 13 minutes 24 seconds  Findings:                 The perianal and digital rectal examinations were                            normal.                           A 7 mm polyp was found in the ascending colon. The                            polyp was sessile. The polyp was removed with a                            cold snare. Resection and retrieval were complete.                           The exam was otherwise without abnormality. Complications:  No immediate complications. Estimated Blood Loss:     Estimated blood loss was minimal. Impression:               - One 7 mm polyp in the ascending colon, removed                            with a cold snare. Resected and retrieved.                           - The examination was otherwise normal. Recommendation:           - Patient has a contact number available for                            emergencies. The signs and symptoms of potential                            delayed complications were discussed with the                            patient. Return to normal activities tomorrow.                            Written discharge instructions were provided to the                            patient.                           - Resume previous diet.                           - Continue present medications.                           - Await pathology results.                            - Repeat colonoscopy in 5-10 years for surveillance                            based on pathology results. Mauri Pole, MD 11/23/2016 10:08:14 AM This report has been signed electronically.

## 2016-11-23 NOTE — Patient Instructions (Signed)
Handout given on polyps  YOU HAD AN ENDOSCOPIC PROCEDURE TODAY: Refer to the procedure report and other information in the discharge instructions given to you for any specific questions about what was found during the examination. If this information does not answer your questions, please call [place] office at [phone] to clarify.   YOU SHOULD EXPECT: Some feelings of bloating in the abdomen. Passage of more gas than usual. Walking can help get rid of the air that was put into your GI tract during the procedure and reduce the bloating. If you had a lower endoscopy (such as a colonoscopy or flexible sigmoidoscopy) you may notice spotting of blood in your stool or on the toilet paper. Some abdominal soreness may be present for a day or two, also.  DIET: Your first meal following the procedure should be a light meal and then it is ok to progress to your normal diet. A half-sandwich or bowl of soup is an example of a good first meal. Heavy or fried foods are harder to digest and may make you feel nauseous or bloated. Drink plenty of fluids but you should avoid alcoholic beverages for 24 hours. If you had a esophageal dilation, please see attached instructions for diet.    ACTIVITY: Your care partner should take you home directly after the procedure. You should plan to take it easy, moving slowly for the rest of the day. You can resume normal activity the day after the procedure however YOU SHOULD NOT DRIVE, use power tools, machinery or perform tasks that involve climbing or major physical exertion for 24 hours (because of the sedation medicines used during the test).   SYMPTOMS TO REPORT IMMEDIATELY: A gastroenterologist can be reached at any hour. Please call [phone]  for any of the following symptoms:  Following lower endoscopy (colonoscopy, flexible sigmoidoscopy) Excessive amounts of blood in the stool  Significant tenderness, worsening of abdominal pains  Swelling of the abdomen that is  new, acute  Fever of 100 or higher    FOLLOW UP:  If any biopsies were taken you will be contacted by phone or by letter within the next 1-3 weeks. Call [phone]  if you have not heard about the biopsies in 3 weeks.  Please also call with any specific questions about appointments or follow up tests.  

## 2016-11-23 NOTE — Progress Notes (Signed)
Called to room to assist during endoscopic procedure.  Patient ID and intended procedure confirmed with present staff. Received instructions for my participation in the procedure from the performing physician.  

## 2016-11-23 NOTE — Progress Notes (Signed)
Pt. Very uncooperative with nurse in admitting.  Did not want to take her tank top off for procedure.  Did not want to answer questions about her meds.  Directed Alphonsa Gin RN. About placement of her IV.  Body language tense.  Hostile with facial expressions.

## 2016-11-24 ENCOUNTER — Telehealth: Payer: Self-pay | Admitting: *Deleted

## 2016-11-24 NOTE — Telephone Encounter (Signed)
  Follow up Call-  Call back number 11/23/2016  Post procedure Call Back phone  # [phone]  Permission to leave phone message Yes  Some recent data might be hidden    Mailbox is full, unable to leave message

## 2016-11-24 NOTE — Telephone Encounter (Signed)
  Follow up Call-  Call back number 11/23/2016  Post procedure Call Back phone  # [phone]  Permission to leave phone message Yes  Some recent data might be hidden    Mailbox is full; unable to leave message

## 2016-12-02 ENCOUNTER — Other Ambulatory Visit: Payer: Self-pay | Admitting: *Deleted

## 2016-12-04 ENCOUNTER — Encounter: Payer: Self-pay | Admitting: Gastroenterology

## 2016-12-05 MED ORDER — QUINAPRIL HCL 40 MG PO TABS: ORAL_TABLET | ORAL | 0 refills | Status: DC

## 2016-12-07 ENCOUNTER — Ambulatory Visit: Payer: Managed Care, Other (non HMO) | Admitting: Internal Medicine

## 2016-12-12 ENCOUNTER — Encounter: Payer: Self-pay | Admitting: *Deleted

## 2016-12-21 ENCOUNTER — Ambulatory Visit (INDEPENDENT_AMBULATORY_CARE_PROVIDER_SITE_OTHER)
Admission: RE | Admit: 2016-12-21 | Discharge: 2016-12-21 | Disposition: A | Discharge status: Home / Self Care | Payer: Managed Care, Other (non HMO) | Source: Ambulatory Visit | Attending: Internal Medicine | Admitting: Internal Medicine

## 2016-12-21 ENCOUNTER — Encounter: Payer: Self-pay | Admitting: Internal Medicine

## 2016-12-21 ENCOUNTER — Ambulatory Visit (INDEPENDENT_AMBULATORY_CARE_PROVIDER_SITE_OTHER): Payer: Managed Care, Other (non HMO) | Admitting: Internal Medicine

## 2016-12-21 VITALS — BP 110/70 | HR 86 | Ht 62.0 in | Wt 133.6 lb

## 2016-12-21 DIAGNOSIS — J4520 Mild intermittent asthma, uncomplicated: Secondary | ICD-10-CM

## 2016-12-21 NOTE — Patient Instructions (Signed)
We can continue your current inhalers The Dark blue Dulera 100 is a maintenance inhaler- meant for daily use-                      Inhale 2 puffs, then rinse mouth, twice daily  Use the rescue inhaler- either Ventolin or ProAir when needed-    Inhale 2 puffs every 6 hours, if needed.  Order- CXR    Dx asthma moderate with exacerbation  Order- schedule PFT   Please call as needed

## 2016-12-21 NOTE — Progress Notes (Signed)
Subjective:     Patient ID: Brooke Floyd, female   DOB: [birth year], 51 y.o.   MRN: [id number]  HPI  female never smoker followed for asthma complicated by GERD, DM, GERD, HBP, restless legs  --------------------------------------------------------------------------------------------------- 10/30/14- 49 yoF never smoker, followed for asthma complicated by GERD. FOLLOWS FOR: last seen 01/2013.  pt has no breathing complaints at this time. 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 Using [facility] 8 units inhaler twice daily with only occasional need for rescue inhaler and no significant cough or wheeze. She wants to continue these medicines and Singulair.  11/30/2015-51 year old female never smoker followed for asthma complicated by GERD, DM, GERD, HBP, restless legs ACUTE VISIT:Pt has been using rescue inhaler daily for about the past 2 weeks. SOB and wheezing. Denies any cough. Increased short of breath for the past 2 weeks without infection or acute symptoms. Blames the hot weather. Using her medications as instructed. Diabetic but interested in getting a steroid injection which should help her before. 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 Awakens at night with leg cramps. Wants natural" therapy for this".  12/21/16- 51 year old female never smoker followed for asthma complicated by GERD, DM, GERD, HBP, restless legs Pt stated she had a bad asthma attack Sunday, 12/18/16. Stated that she has had an attack x3 days straight where she has had to use both of her inhalers to help  Albuterol( Ventolin or ProAir), Dulera 100, singulair Had a marked episode of wheezing dyspnea after exposure to strong cologne. She used both her rescue and maintenance inhalers several times that day. Otherwise she hasn't felt she needed them consistently. No routine wheeze or sleep disturbance. Now working private  duty/sitting for invalid's  ROS-see HPI   + = pos Constitutional:   No-   weight loss, night sweats, fevers, chills, fatigue, lassitude.  HEENT:   No-  headaches, difficulty swallowing, tooth/dental problems, sore throat,       No-  sneezing, itching, ear ache,+ nasal congestion, post nasal drip,  CV:  No-   chest pain, orthopnea, PND, swelling in lower extremities, anasarca, dizziness, palpitations Resp: +   shortness of breath with exertion or at rest.              No-   productive cough, no- non-productive cough,  No- coughing up of blood.              No-   change in color of mucus.  + wheezing.   Skin: No-   rash or lesions. GI:  No-   heartburn, indigestion, abdominal pain, nausea, vomiting,  GU:  MS:  No-   joint pain or swelling.  . Neuro-     nothing unusual Psych:  No- change in mood or affect. No depression or anxiety.  No memory loss.  OBJ- Physical Exam General- Alert, Oriented, Affect-appropriate, Distress- none acute. Overweight Skin- rash-none, lesions- none, excoriation- none Lymphadenopathy- tender L axilla c/w hydradinitis Head- atraumatic            Eyes- Gross vision intact, PERRLA, conjunctivae and secretions clear            Ears- Hearing, canals-normal            Nose- Clear, no-Septal dev, mucus, polyps, erosion, perforation             Throat- Mallampati III , mucosa clear , drainage- none, tonsils-  atrophic Neck- flexible , trachea midline, no stridor , thyroid nl, carotid no bruit Chest - symmetrical excursion , unlabored           Heart/CV- RRR , no murmur , no gallop  , no rub, nl s1 s2                           - JVD- none , edema- none, stasis changes- none, varices- none           Lung- clear, wheeze- none, cough- none , dullness-none, rub- none, unlabored           Chest wall-  Abd-  Br/ Gen/ Rectal- Not done, not indicated Extrem- cyanosis- none, clubbing, none, atrophy- none, strength- nl Neuro- grossly intact to observation

## 2016-12-26 NOTE — Assessment & Plan Note (Signed)
Not clear, but I suspect she may have had a panic episode which she smelled strong perfume. She certainly overused her inhalers. We reviewed inhaler use. Plan-reevaluate lung status by scheduling CXR and PFT

## 2017-01-04 ENCOUNTER — Other Ambulatory Visit: Payer: Self-pay | Admitting: Internal Medicine

## 2017-01-04 DIAGNOSIS — G2581 Restless legs syndrome: Secondary | ICD-10-CM

## 2017-01-04 NOTE — Telephone Encounter (Signed)
Checked w/ doris, she states yes, she changed pt to your cc, pt cannot come in on dr rathore's cc days.

## 2017-01-05 NOTE — Telephone Encounter (Addendum)
Fax from pharmacy.  Lantus is not covered by patient's insurance could she be changed to [place], Facilities manager instead?  Message was sent to Dr Jari Favre for consideration for change. Sander Nephew, RN 01/05/2017 12:01 PM Call to patient's pharmacy-patient is on Levemir would like to change to [facility] which she can get for $25. Message sent to Dr. Jari Favre.  Sander Nephew, RN  01/16/2017 10:50 AM.

## 2017-01-11 ENCOUNTER — Ambulatory Visit (INDEPENDENT_AMBULATORY_CARE_PROVIDER_SITE_OTHER): Payer: Managed Care, Other (non HMO) | Admitting: Internal Medicine

## 2017-01-11 VITALS — BP 157/84 | HR 85 | Temp 98.1°F | Ht 62.0 in | Wt 136.5 lb

## 2017-01-11 DIAGNOSIS — E785 Hyperlipidemia, unspecified: Secondary | ICD-10-CM

## 2017-01-11 DIAGNOSIS — Z8249 Family history of ischemic heart disease and other diseases of the circulatory system: Secondary | ICD-10-CM

## 2017-01-11 DIAGNOSIS — I10 Essential (primary) hypertension: Secondary | ICD-10-CM | POA: Diagnosis not present

## 2017-01-11 DIAGNOSIS — G2581 Restless legs syndrome: Secondary | ICD-10-CM | POA: Diagnosis not present

## 2017-01-11 DIAGNOSIS — E119 Type 2 diabetes mellitus without complications: Secondary | ICD-10-CM

## 2017-01-11 DIAGNOSIS — Z79899 Other long term (current) drug therapy: Secondary | ICD-10-CM | POA: Diagnosis not present

## 2017-01-11 DIAGNOSIS — N939 Abnormal uterine and vaginal bleeding, unspecified: Secondary | ICD-10-CM | POA: Diagnosis not present

## 2017-01-11 DIAGNOSIS — R232 Flushing: Secondary | ICD-10-CM

## 2017-01-11 MED ORDER — GABAPENTIN 300 MG PO CAPS: ORAL_CAPSULE | ORAL | 1 refills | Status: DC

## 2017-01-11 NOTE — Progress Notes (Signed)
Internal Medicine Clinic Attending  Case discussed with Dr. Svalina  at the time of the visit.  We reviewed the resident's history and exam and pertinent patient test results.  I agree with the assessment, diagnosis, and plan of care documented in the resident's note.  

## 2017-01-11 NOTE — Patient Instructions (Addendum)
For your hot flashes and restless legs, start taking 3 capsules (900mg ) gabapentin 1-2 hours before sleep; keep taking 0.5mg  pramipexole like you have been at night.   I will check some labs to see if there is another cause for your increased restless leg symptoms and let you know if there is anything abnormal.   I have referred you to gynecology for your new bleeding.   I will see you in one month; call to be seen sooner if needed.

## 2017-01-11 NOTE — Assessment & Plan Note (Signed)
Patient endorses several month history of hot flashes. She was in the past her on citalopram but developed pruritus which stopped with stopping the medication. She is also on gabapentin for restless leg syndrome which appeared to have been controlling her symptoms of hot flashes as well. States right now her hot flashes are not controlled she has to taking over-the-counter black cohosh for about the past month with no relief.  Plan: --inc gabapentin to 900mg  qhs and 300mg  daily PRN --in the future can consider starting venlafaxine

## 2017-01-11 NOTE — Progress Notes (Signed)
   CC: leg cramps, hot flashes  HPI:  Brooke Floyd is a 51 y.o. with a PMH of type 2 diabetes, hot flashes, restless leg syndrome, hypertension, hypertension, hyperlipidemia presenting to clinic for reevaluation of restless legs syndrome and hot flashes.  Patient describes feeling sharp pains minor cramps bilateral legs that are worse with sitting or lying still and are improved with movement. Patient endorses that her husband says she kicks her legs at night frequently. She was started on gabapentin and pramipexole for this. Previous previous workup included a normal TSH, normal vitamin D, normal hemoglobin, and normal potassium. She does have a history of anemia which he thinks was secondary to her menorrhagia from her uterine fibroids. Patient has had a hysterectomy and single oophorectomy for the fibroids and ovarian cyst. She states that last month she felt like she had whole period which has never happened since her hysterectomy. She denies other signs or symptoms of bleeding. Patient states that in the past the gabapentin pramipexole did help but don't seem to be working as well anymore.  Patient endorses several month history of hot flashes. She was in the past her on citalopram but developed pruritus which stopped with stopping the medication. She is also on gabapentin for restless leg syndrome which appeared to have been controlling her symptoms of hot flashes as well. States right now her hot flashes are not controlled she has to taking over-the-counter black cohosh for about the past month with no relief.  Please see problem based Assessment and Plan for status of patients chronic conditions.  Past Medical History:  Diagnosis Date  . GERD (gastroesophageal reflux disease)   . Hyperlipidemia   . Hypertension   . Other symptoms involving nervous and musculoskeletal systems(781.99)   . Type II or unspecified type diabetes mellitus without mention of complication, not stated as  uncontrolled    takes insulin  . Unspecified asthma, with exacerbation     Review of Systems:   Review of Systems  Constitutional: Negative for weight loss.  Eyes: Positive for blurred vision (lost glasses).  Cardiovascular: Negative for chest pain and leg swelling.  Gastrointestinal: Negative for abdominal pain and blood in stool.  Genitourinary: Negative for hematuria.  Musculoskeletal:       Leg cramps, sharp pains with sitting/lying still  Skin: Negative for itching and rash.  Neurological: Negative for tingling, sensory change, focal weakness and weakness.  Psychiatric/Behavioral: Negative for depression. The patient has insomnia (from restless legs).     Physical Exam:  Vitals:   01/11/17 0949 01/11/17 1019  BP: (!) 153/72 (!) 157/84  Pulse: 83 85  Temp: 98.1 F (36.7 C)   TempSrc: Oral   SpO2: 100%   Weight: 136 lb 8 oz (61.9 kg)   Height: 5\' 2"  (1.575 m)    GENERAL- alert, co-operative, appears as stated age, not in any distress. HEENT- Atraumatic, normocephalic, EOMI CARDIAC- RRR, no murmurs, rubs or gallops. RESP- Moving equal volumes of air, and clear to auscultation bilaterally, no wheezes or crackles. ABDOMEN- Soft, nontender, bowel sounds present. NEURO- No obvious Cr N abnormality. EXTREMITIES- pulse 2+, symmetric, no pedal edema, warm extremities, no tenderness to palpation of legs, no palpable calf cords.Marland Kitchen SKIN- Warm, dry, no rash or lesion. PSYCH- Normal mood and affect, appropriate thought content and speech.  Assessment & Plan:   See Encounters Tab for problem based charting.   Patient discussed with Dr. Abelardo Diesel, MD Internal Medicine PGY2

## 2017-01-11 NOTE — Assessment & Plan Note (Addendum)
Patient reports 3 days of vaginal bleeding about a month ago that was same quantity as previous menstrual periods. She denies other similar episodes; per chart review she did have prior episode about a year ago which lasted only a few hours. Patient denies other symptoms, denies fatigue or weight loss that would be concerning for malignancy.   She is s/p abd hysterectomy and right oophorectomy (2/2 symptomatic fibroids and ovarian cysts) in early 2000s. She is uncertain whether she has a cervix and records or path reports are not in the chart unfortunately.  Plan: --refer to Gyn for further evaluation and management

## 2017-01-11 NOTE — Assessment & Plan Note (Signed)
Patient describes feeling sharp pains minor cramps bilateral legs that are worse with sitting or lying still and are improved with movement. Patient endorses that her husband says she kicks her legs at night frequently. She was started on gabapentin and pramipexole for this. Previous previous workup included a normal TSH, normal vitamin D, normal hemoglobin, and normal potassium. She does have a history of anemia which he thinks was secondary to her menorrhagia from her uterine fibroids. Patient has had a hysterectomy and single oophorectomy for the fibroids and ovarian cyst. She states that last month she felt like she had whole period which has never happened since her hysterectomy. She denies other signs or symptoms of bleeding. Patient states that in the past the gabapentin pramipexole did help but don't seem to be working as well anymore.  Exam is unremarkable.   Previous CBC have had normal Hgb, high-normal MCVs and persistent thrombocytopenia which could be reactive.  Plan: --f/u CBC, Mg, Folate, and vit B12 --inc gabapentin to 900mg  qhs and 300mg  daily PRN --continue pramipexole 0.5mg  qhs

## 2017-01-12 LAB — CBC
HEMATOCRIT: 35.3 % (ref 34.0–46.6)
HEMOGLOBIN: 11.5 g/dL (ref 11.1–15.9)
MCH: 29.4 pg (ref 26.6–33.0)
MCHC: 32.6 g/dL (ref 31.5–35.7)
MCV: 90 fL (ref 79–97)
Platelets: 546 10*3/uL — ABNORMAL HIGH (ref 150–379)
RBC: 3.91 x10E6/uL (ref 3.77–5.28)
RDW: 12.9 % (ref 12.3–15.4)
WBC: 6.2 10*3/uL (ref 3.4–10.8)

## 2017-01-12 LAB — MAGNESIUM: Magnesium: 1.7 mg/dL (ref 1.6–2.3)

## 2017-01-12 LAB — FOLATE RBC
Folate, Hemolysate: 521.9 ng/mL
Folate, RBC: 1478 ng/mL (ref >498)

## 2017-01-12 LAB — VITAMIN B12: VITAMIN B 12: 395 pg/mL (ref 232–1245)

## 2017-01-16 MED ORDER — BASAGLAR KWIKPEN 100 UNIT/ML ~~LOC~~ SOPN: 30.0000 [IU] | PEN_INJECTOR | Freq: Every day | SUBCUTANEOUS | 2 refills | Status: DC

## 2017-01-16 NOTE — Telephone Encounter (Signed)
Call from patient-requests rx for levemir be changed to [place] as it is less expensive.  Will send to pcp for review and medication change if appropriate.Despina Hidden Cassady9/10/20183:35 PM  Confirmed with pharmacy-Basaglar cost less than the levemir

## 2017-01-16 NOTE — Telephone Encounter (Signed)
Patient's levemir was changed to Basaglar due to cheaper cost.   Sent in script for basaglar kwik pen 30 units qhs, 70ml with 2 refills.  Alphonzo Grieve, MD IMTS - PGY2 Pager [phone]

## 2017-01-19 ENCOUNTER — Other Ambulatory Visit: Payer: Self-pay | Admitting: *Deleted

## 2017-01-19 MED ORDER — SIMVASTATIN 80 MG PO TABS: ORAL_TABLET | ORAL | 3 refills | Status: DC

## 2017-01-23 ENCOUNTER — Encounter: Payer: Self-pay | Admitting: Obstetrics & Gynecology

## 2017-01-25 ENCOUNTER — Other Ambulatory Visit: Payer: Self-pay | Admitting: Internal Medicine

## 2017-02-07 NOTE — Progress Notes (Signed)
   CC: diabetes  HPI:  [name] is a 51 y.o. with a PMH of T2DM, HTN, restless leg syndrome, abn vaginal bleeding, hot flashes and thrombocytosis presenting to clinic for follow up on her diabetes.  T2DM: Patient with last A1c of 6.6, on regimen of basaglar 30 units qhs, Januvia 100mg  daily, glipizide 10mg  daily, and metformin 1000mg  BID. She states she rarely checks her CBGs, and when she does her fasting CBGs are usually in the low 100-120. She endorses one episode of lower CBG to 75 with feeling of dizziness and nausea a couple of months ago. Patient endorses eating a lot of fries, fried fish, and iced coffees. She denies polydispia, polyuria.  HTN: Patient compliant with quinapril 40mg  daily and HCTZ 12.5mg  daily. She denies chest pain, shortness of breath, headaches, hearing changes. She does have some blurry vision since she lost her glasses.  Hot flashes: Patient with better control of hot flashes since adjusting her gabapentin dose last visit.  GERD: Patient endorses symptoms of acid reflux, bloating and belching that has not been amenable to prilosec OTC. She endorses drinking several iced coffees throughout the day, and eating fried foods frequently. She denies abdominal pain, nausea, vomiting, hematochezia or melena.   Thrombocytosis: Patient with consistently elevated platelets since 2014, last was 546 one month ago. She denies prior knowledge or work up for this.   Please see problem based Assessment and Plan for status of patients chronic conditions.  Past Medical History:  Diagnosis Date  . GERD (gastroesophageal reflux disease)   . Hyperlipidemia   . Hypertension   . Other symptoms involving nervous and musculoskeletal systems(781.99)   . Type II or unspecified type diabetes mellitus without mention of complication, not stated as uncontrolled    takes insulin  . Unspecified asthma, with exacerbation     Review of Systems:   ROS Per HPI  Physical  Exam:  Vitals:   02/08/17 1558  BP: 134/78  Pulse: 80  Temp: 97.8 F (36.6 C)  TempSrc: Oral  SpO2: 100%  Weight: 134 lb 14.4 oz (61.2 kg)  Height: 5\' 2"  (1.575 m)   GENERAL- alert, co-operative, appears as stated age, not in any distress. HEENT- Atraumatic, normocephalic, PERRL, EOMI, oral mucosa appears moist CARDIAC- RRR, systolic murmur, no rubs or gallops. RESP- Moving equal volumes of air, and clear to auscultation bilaterally, no wheezes or crackles. ABDOMEN- Soft, nontender, bowel sounds present. NEURO- No obvious Cr N abnormality. EXTREMITIES- pulse 2+ DP/PT, symmetric, no pedal edema. SKIN- Warm, dry, no rash or lesion. PSYCH- Normal mood and affect, appropriate thought content and speech.  Assessment & Plan:   See Encounters Tab for problem based charting.   Patient discussed with Dr. Andris Baumann, MD Internal Medicine PGY2

## 2017-02-08 ENCOUNTER — Ambulatory Visit (INDEPENDENT_AMBULATORY_CARE_PROVIDER_SITE_OTHER): Payer: Managed Care, Other (non HMO) | Admitting: Internal Medicine

## 2017-02-08 ENCOUNTER — Encounter: Payer: Self-pay | Admitting: Internal Medicine

## 2017-02-08 VITALS — BP 134/78 | HR 80 | Temp 97.8°F | Ht 62.0 in | Wt 134.9 lb

## 2017-02-08 DIAGNOSIS — R232 Flushing: Secondary | ICD-10-CM

## 2017-02-08 DIAGNOSIS — N939 Abnormal uterine and vaginal bleeding, unspecified: Secondary | ICD-10-CM | POA: Diagnosis not present

## 2017-02-08 DIAGNOSIS — I10 Essential (primary) hypertension: Secondary | ICD-10-CM

## 2017-02-08 DIAGNOSIS — D473 Essential (hemorrhagic) thrombocythemia: Secondary | ICD-10-CM

## 2017-02-08 DIAGNOSIS — Z23 Encounter for immunization: Secondary | ICD-10-CM

## 2017-02-08 DIAGNOSIS — J4520 Mild intermittent asthma, uncomplicated: Secondary | ICD-10-CM

## 2017-02-08 DIAGNOSIS — D75839 Thrombocytosis, unspecified: Secondary | ICD-10-CM

## 2017-02-08 DIAGNOSIS — K219 Gastro-esophageal reflux disease without esophagitis: Secondary | ICD-10-CM | POA: Diagnosis not present

## 2017-02-08 DIAGNOSIS — E119 Type 2 diabetes mellitus without complications: Secondary | ICD-10-CM | POA: Diagnosis not present

## 2017-02-08 HISTORY — DX: Thrombocytosis, unspecified: D75.839

## 2017-02-08 LAB — GLUCOSE, CAPILLARY: Glucose-Capillary: 198 mg/dL — ABNORMAL HIGH (ref 65–99)

## 2017-02-08 LAB — POCT GLYCOSYLATED HEMOGLOBIN (HGB A1C): Hemoglobin A1C: 7.2

## 2017-02-08 MED ORDER — BENZONATATE 200 MG PO CAPS: 200.0000 mg | ORAL_CAPSULE | Freq: Two times a day (BID) | ORAL | 2 refills | Status: DC | PRN

## 2017-02-08 MED ORDER — PANTOPRAZOLE SODIUM 40 MG PO TBEC: 40.0000 mg | DELAYED_RELEASE_TABLET | Freq: Every day | ORAL | 1 refills | Status: DC

## 2017-02-08 MED ORDER — HYDROCHLOROTHIAZIDE 12.5 MG PO CAPS: 12.5000 mg | ORAL_CAPSULE | Freq: Every day | ORAL | 3 refills | Status: DC

## 2017-02-08 MED ORDER — QUINAPRIL HCL 40 MG PO TABS: ORAL_TABLET | ORAL | 3 refills | Status: DC

## 2017-02-08 NOTE — Assessment & Plan Note (Addendum)
Patient with consistently elevated platelets since 2014, last was 546 one month ago. She denies prior knowledge or work up for this.  Etiology of thrombocytosis could be reactive 2/2 infection or inflammation, anemia, from malignancy, from splenectomy, essential or familial, from underlying rheumatological disorder, or from myeloproliferative disorder.   Patient very symptomatic GERD which could be causing reactive thrombocytosis; also has a couple of chronic dental carries. More concerning would be from malignancy in setting of her abn vaginal bleeding. She denies a family history of thrombocytosis or collagen vascular disease, personal or family history of autoimmune disease. Her iron deficiency anemia is resolved.  Plan: --will treat GERD, patient has dentist appointment --patient has Gyn appointment next week for evaluation of vaginal bleeding --repeat CBC in a couple of months, if above are controlled, could consider ordering ESR/CRP for markers of inflammation --from discussion with Dr. Granfortuna with hematology, could consider CXR for possible TB as cause in the future though patient asymptomatic; recommendation is to place on ASA which patient is on. If thrombocytosis persistent and no other causes found, would check JAK2 and MPL for evaluation of polycythemia vera, essential thrombocytopenia, and primary myelofibrosis. He advises that usually platelet reduction agents are not started until Plts >1million.  

## 2017-02-08 NOTE — Assessment & Plan Note (Signed)
Patient with 2 episodes of vaginal bleeding. She is s/p hysterectomy for symptomatic fibroids and single oophorectomy for cyst. She has an gyn appointment next week for evaluation and management, which I encouraged her to keep for evaluation of possible vaginal malignancy; no records to specify whether cervix is present so +/- possibility for cervical cancer.

## 2017-02-08 NOTE — Assessment & Plan Note (Signed)
Improved since increase in gabapentin.  Plan: --continue gabapentin 900mg  qhs and 300mg  daily PRN

## 2017-02-08 NOTE — Patient Instructions (Addendum)
Please keep your appointment with gynecology on the 10th.   Your A1c is 7.2 today; we will not be making changes to your medications today.  For your acid reflux, please take pantoprazole 40mg  on an empty stomach 30 minutes before your first meal of the day. To help control your acid reflux (and diabetes), please work on decreasing heavy/fatty foods (hamburger, fries, fried chicken, fried fish), caffeine (any coffee), soda's, juices.

## 2017-02-08 NOTE — Assessment & Plan Note (Addendum)
Patient with worsening A1c from 6.6 to 7.2 today; compliant with medications and otherwise asymptomatic.  Plan: --continue basaglar 30 units qhs --continue [place] 100mg  daily --continue glipizide 10mg  daily --continue metformin 1000mg  BID --discussed diet changes applicable to patient for better control of her diabetes and GERD --foot exam performed today

## 2017-02-08 NOTE — Assessment & Plan Note (Addendum)
Worsening. Patient endorses symptoms of acid reflux, bloating and belching that has not been amenable to prilosec OTC. She endorses drinking several iced coffees throughout the day, and eating fried foods frequently. She denies abdominal pain, nausea, vomiting, hematochezia or melena.  Exam benign.  Plan: --discussed diet changes --start protonix 40mg  daily 30 minutes before first meal of day

## 2017-02-08 NOTE — Assessment & Plan Note (Signed)
Well controlled on current regimen and asymptomatic.  Plan: --refilled quinapril 40mg  daily and HCTZ 12.5mg  daily

## 2017-02-10 ENCOUNTER — Other Ambulatory Visit: Payer: Self-pay

## 2017-02-10 NOTE — Telephone Encounter (Signed)
Refill request pantoprazole (PROTONIX) 40 MG tablet @ [facility] on [address].

## 2017-02-10 NOTE — Telephone Encounter (Signed)
Left msg to patient's phone to call us back regarding this med refill.

## 2017-02-10 NOTE — Telephone Encounter (Signed)
Called [facility] & got med transferred. Notified pharmacy if PA is needed, [place] [facility] will work on it. Regino Schultze informed)

## 2017-02-13 ENCOUNTER — Telehealth: Payer: Self-pay | Admitting: *Deleted

## 2017-02-13 NOTE — Telephone Encounter (Signed)
PA information was sent to CoverMyMeds for Pantoprazole.  Awaiting determination within 72 hours if approved.  Sander Nephew, RN 02/13/2017 9:01 AM.

## 2017-02-14 NOTE — Telephone Encounter (Signed)
Call to [facility] after receiving fax that med was denied.  [employer] for patient does not cover Proton-Pump Inhibitors .  Patent will need to try something else.  Message was sent to Dr. Jari Favre.Sander Nephew, RN 02/14/2017 1:49 PM.

## 2017-02-14 NOTE — Progress Notes (Signed)
Case discussed with Dr. Svalina at the time of the visit.  We reviewed the resident's history and exam and pertinent patient test results.  I agree with the assessment, diagnosis and plan of care documented in the resident's note. 

## 2017-02-15 ENCOUNTER — Encounter: Payer: Managed Care, Other (non HMO) | Admitting: Obstetrics & Gynecology

## 2017-03-09 ENCOUNTER — Other Ambulatory Visit: Payer: Self-pay | Admitting: Internal Medicine

## 2017-03-09 DIAGNOSIS — E119 Type 2 diabetes mellitus without complications: Secondary | ICD-10-CM

## 2017-03-10 ENCOUNTER — Other Ambulatory Visit: Payer: Self-pay | Admitting: *Deleted

## 2017-03-10 MED ORDER — MONTELUKAST SODIUM 10 MG PO TABS: 10.0000 mg | ORAL_TABLET | Freq: Every day | ORAL | 3 refills | Status: DC

## 2017-03-14 ENCOUNTER — Encounter: Payer: Managed Care, Other (non HMO) | Admitting: Internal Medicine

## 2017-03-28 ENCOUNTER — Other Ambulatory Visit: Payer: Self-pay | Admitting: Internal Medicine

## 2017-04-03 ENCOUNTER — Other Ambulatory Visit: Payer: Self-pay | Admitting: Internal Medicine

## 2017-04-03 DIAGNOSIS — G2581 Restless legs syndrome: Secondary | ICD-10-CM

## 2017-04-07 ENCOUNTER — Encounter: Payer: Self-pay | Admitting: Obstetrics & Gynecology

## 2017-04-07 ENCOUNTER — Other Ambulatory Visit: Payer: Self-pay | Admitting: *Deleted

## 2017-04-07 ENCOUNTER — Ambulatory Visit: Payer: Managed Care, Other (non HMO) | Admitting: Obstetrics & Gynecology

## 2017-04-07 VITALS — BP 127/78 | HR 98 | Ht 62.0 in | Wt 135.4 lb

## 2017-04-07 DIAGNOSIS — R58 Hemorrhage, not elsewhere classified: Secondary | ICD-10-CM

## 2017-04-07 NOTE — Progress Notes (Signed)
Patient ID: Brooke Floyd, female   DOB: 05-16-1965, 51 y.o.   MRN: [id number]  Chief Complaint  Patient presents with  . Vaginal Bleeding    had bleeding for one day only    HPI Brooke Floyd is a 51 y.o. female.  She was referred her with the issue of 1 day of vaginal bleeding about 3 months ago. It was not associated with any other symptoms.  She reports that she is abstinent but cannot remember how long.  HPI  Past Medical History:  Diagnosis Date  . GERD (gastroesophageal reflux disease)   . Hyperlipidemia   . Hypertension   . Other symptoms involving nervous and musculoskeletal systems(781.99)   . Type II or unspecified type diabetes mellitus without mention of complication, not stated as uncontrolled    takes insulin  . Unspecified asthma, with exacerbation     Past Surgical History:  Procedure Laterality Date  . COMBINED HYSTERECTOMY ABDOMINAL W/ A&P REPAIR / OOPHORECTOMY    . WRIST ARTHROSCOPY      Family History  Problem Relation Age of Onset  . Coronary artery disease Father   . Diabetes Father   . Diabetes Mother   . Breast cancer Neg Hx   . Colon cancer Neg Hx     Social History Social History   Tobacco Use  . Smoking status: Never Smoker  . Smokeless tobacco: Never Used  Substance Use Topics  . Alcohol use: No    Alcohol/week: 0.0 oz  . Drug use: No    Allergies  Allergen Reactions  . Diclofenac Sodium Itching  . Hydrocodone Itching  . Penicillins Other (See Comments)    convulsions  . Voltaren [Diclofenac] Itching  . Codeine Itching    Current Outpatient Medications  Medication Sig Dispense Refill  . albuterol (PROVENTIL HFA;VENTOLIN HFA) 108 (90 Base) MCG/ACT inhaler Inhale 2 puffs into the lungs every 6 (six) hours as needed for wheezing or shortness of breath. 3 Inhaler 3  . aspirin 81 MG tablet Take 1 tablet (81 mg total) by mouth daily. 90 tablet 2  . Cholecalciferol (VITAMIN D-3) 5000 UNITS TABS Take 1 tablet by mouth daily.     Marland Kitchen gabapentin (NEURONTIN) 300 MG capsule Take 3 capsules 1-2 hours before bedtime; can take 1 capsule during day. 180 capsule 1  . glipiZIDE (GLUCOTROL) 10 MG tablet TAKE ONE TABLET BY MOUTH DAILY BEFORE BREAKFAST 90 tablet 3  . hydrochlorothiazide (MICROZIDE) 12.5 MG capsule Take 1 capsule (12.5 mg total) by mouth daily. 90 capsule 3  . Insulin Glargine (BASAGLAR KWIKPEN) 100 UNIT/ML SOPN Inject 0.3 mLs (30 Units total) into the skin at bedtime. 30 mL 2  . Insulin Pen Needle (PEN NEEDLES 3/16") 31G X 5 MM MISC Use to inject Lantus at bedtime as instructed. 100 each 6  . JANUVIA 100 MG tablet TAKE ONE TABLET BY MOUTH DAILY 90 tablet 3  . metFORMIN (GLUCOPHAGE) 1000 MG tablet TAKE 1 TABLET TWICE A DAY WITH MEALS 180 tablet 3  . mometasone-formoterol (DULERA) 100-5 MCG/ACT AERO Inhale 2 puffs into the lungs 2 (two) times daily.    . montelukast (SINGULAIR) 10 MG tablet Take 1 tablet (10 mg total) by mouth at bedtime. 90 tablet 3  . pantoprazole (PROTONIX) 40 MG tablet Take 1 tablet (40 mg total) by mouth daily. 90 tablet 1  . pramipexole (MIRAPEX) 0.5 MG tablet TAKE ONE TABLET BY MOUTH DAILY AT BEDTIME 90 tablet 1  . quinapril (ACCUPRIL) 40 MG tablet TAKE 1  TABLET BY MOUTH DAILY 90 tablet 3  . simvastatin (ZOCOR) 80 MG tablet TAKE 1 TABLET BY MOUTH BEFORE BEDTIME 90 tablet 3  . B-D UF III MINI PEN NEEDLES 31G X 5 MM MISC USE TO INJECT LANTUS AT BEDTIME AS DIRECTED 100 each 5   No current facility-administered medications for this visit.     Review of Systems Review of Systems She has complaints of shoulder pain and reflux. These are being addressed by her primary care provider.  Blood pressure 127/78, pulse 98, height 5\' 2"  (1.575 m), weight 135 lb 6.4 oz (61.4 kg).  Physical Exam Physical Exam Well nourished, well hydrated Black female, no apparent distress Breathing, conversing, and ambulating normally Vaginal appears normal, no cervix, good estrogen effect Bimanual exam reveals no  masses  Data Reviewed   Assessment    1 day episode of vaginal bleeding- normal exam today    Plan    Reassurance given. I have recommended that if the bleeding returns that she go immediately to [facility] so that we can determine the etiology. Check [place]       Briyan Kleven C Chadwick Reiswig 04/07/2017, 8:27 AM

## 2017-04-07 NOTE — Telephone Encounter (Signed)
Received faxed hctz refill request from [facility]).  Pt has refills left at [facility].  Pt has 9 pharmacies listed on her profile.  Pt was contacted and stated that she only uses [facility] on [address] other pharmacies removed from pt's profile.  [facility] was contacted to have prescriptions transferred from [facility] at pt's request.  No further action needed at this time-phone call complete.Regenia Skeeter, Kimara Bencomo Cassady11/30/20189:56 AM

## 2017-04-08 LAB — FOLLICLE STIMULATING HORMONE: FSH: 92.3 m[IU]/mL

## 2017-04-10 ENCOUNTER — Other Ambulatory Visit: Payer: Self-pay | Admitting: Internal Medicine

## 2017-04-10 DIAGNOSIS — G2581 Restless legs syndrome: Secondary | ICD-10-CM

## 2017-04-10 DIAGNOSIS — R232 Flushing: Secondary | ICD-10-CM

## 2017-04-14 ENCOUNTER — Encounter: Payer: Self-pay | Admitting: Internal Medicine

## 2017-04-19 ENCOUNTER — Encounter: Payer: Managed Care, Other (non HMO) | Admitting: Internal Medicine

## 2017-04-19 NOTE — Progress Notes (Deleted)
   CC: ***  HPI:  Ms.Yanna Jerilynn Mages [place] is a 51 y.o. with a PMH of ***  Please see problem based Assessment and Plan for status of patients chronic conditions.  Past Medical History:  Diagnosis Date  . GERD (gastroesophageal reflux disease)   . Hyperlipidemia   . Hypertension   . Other symptoms involving nervous and musculoskeletal systems(781.99)   . Type II or unspecified type diabetes mellitus without mention of complication, not stated as uncontrolled    takes insulin  . Unspecified asthma, with exacerbation     Review of Systems:   ROS  Physical Exam:  There were no vitals filed for this visit. GENERAL- alert, co-operative, appears as stated age, not in any distress. HEENT- Atraumatic, normocephalic, PERRL, EOMI, oral mucosa appears moist CARDIAC- RRR, no murmurs, rubs or gallops. RESP- Moving equal volumes of air, and clear to auscultation bilaterally, no wheezes or crackles. ABDOMEN- Soft, nontender, bowel sounds present. NEURO- No obvious Cr N abnormality. EXTREMITIES- pulse 2+, symmetric, no pedal edema. SKIN- Warm, dry, No rash or lesion. PSYCH- Normal mood and affect, appropriate thought content and speech.  Assessment & Plan:   See Encounters Tab for problem based charting.   Patient {GC/GE:3044014::"discussed with","seen with"} Dr. {NAMES:[id number]::"Butcher","Granfortuna","E. Hoffman","Klima","Mullen","Narendra","Vincent"}   Alphonzo Grieve, MD Internal Medicine PGY2

## 2017-04-26 ENCOUNTER — Ambulatory Visit: Payer: Managed Care, Other (non HMO) | Admitting: Internal Medicine

## 2017-04-26 ENCOUNTER — Ambulatory Visit (INDEPENDENT_AMBULATORY_CARE_PROVIDER_SITE_OTHER): Payer: Managed Care, Other (non HMO) | Admitting: Internal Medicine

## 2017-04-26 ENCOUNTER — Encounter: Payer: Self-pay | Admitting: Internal Medicine

## 2017-04-26 VITALS — BP 120/60 | HR 83 | Ht 62.0 in | Wt 134.0 lb

## 2017-04-26 DIAGNOSIS — K219 Gastro-esophageal reflux disease without esophagitis: Secondary | ICD-10-CM

## 2017-04-26 DIAGNOSIS — J4520 Mild intermittent asthma, uncomplicated: Secondary | ICD-10-CM | POA: Diagnosis not present

## 2017-04-26 LAB — PULMONARY FUNCTION TEST
DL/VA % PRED: 101 %
DL/VA: 4.63 ml/min/mmHg/L
DLCO COR % PRED: 64 %
DLCO COR: 13.91 ml/min/mmHg
DLCO UNC % PRED: 64 %
DLCO unc: 13.82 ml/min/mmHg
FEF 25-75 POST: 4.45 L/s
FEF 25-75 PRE: 1.87 L/s
FEF2575-%CHANGE-POST: 137 %
FEF2575-%PRED-POST: 198 %
FEF2575-%Pred-Pre: 83 %
FEV1-%Change-Post: 33 %
FEV1-%Pred-Post: 80 %
FEV1-%Pred-Pre: 60 %
FEV1-Post: 1.7 L
FEV1-Pre: 1.27 L
FEV1FVC-%CHANGE-POST: 1 %
FEV1FVC-%PRED-PRE: 109 %
FEV6-%Change-Post: 32 %
FEV6-%PRED-POST: 74 %
FEV6-%Pred-Pre: 56 %
FEV6-PRE: 1.43 L
FEV6-Post: 1.89 L
FEV6FVC-%Pred-Post: 103 %
FEV6FVC-%Pred-Pre: 103 %
FVC-%Change-Post: 32 %
FVC-%PRED-POST: 71 %
FVC-%Pred-Pre: 54 %
FVC-Post: 1.89 L
FVC-Pre: 1.43 L
POST FEV1/FVC RATIO: 90 %
PRE FEV1/FVC RATIO: 89 %
Post FEV6/FVC ratio: 100 %
Pre FEV6/FVC Ratio: 100 %
RV % pred: 91 %
RV: 1.58 L
TLC % pred: 71 %
TLC: 3.39 L

## 2017-04-26 MED ORDER — ALBUTEROL SULFATE HFA 108 (90 BASE) MCG/ACT IN AERS: 2.0000 | INHALATION_SPRAY | Freq: Four times a day (QID) | RESPIRATORY_TRACT | 12 refills | Status: DC | PRN

## 2017-04-26 MED ORDER — AEROCHAMBER MV MISC: 0 refills | Status: DC

## 2017-04-26 MED ORDER — MOMETASONE FURO-FORMOTEROL FUM 100-5 MCG/ACT IN AERO: 2.0000 | INHALATION_SPRAY | Freq: Two times a day (BID) | RESPIRATORY_TRACT | 12 refills | Status: DC

## 2017-04-26 MED ORDER — SPACER/AERO CHAMBER MOUTHPIECE MISC: 1.0000 "application " | Freq: Two times a day (BID) | 0 refills | Status: DC

## 2017-04-26 MED ORDER — BENZONATATE 200 MG PO CAPS: 200.0000 mg | ORAL_CAPSULE | Freq: Three times a day (TID) | ORAL | 3 refills | Status: DC | PRN

## 2017-04-26 NOTE — Progress Notes (Signed)
PFT done today. 

## 2017-04-26 NOTE — Assessment & Plan Note (Signed)
Mild persistent exacerbation at this visit.  She does not recognize stable bronchospasm but had good response on PFT.  Compounding problems might include cough from ACE inhibitor, reflux.  She has not been using Dulera so this would not be causing thrush or hoarseness but we will add a spacer.  Education done. Resume regular use of Dulera, adding a spacer tube.  Continue rescue inhaler if needed.

## 2017-04-26 NOTE — Assessment & Plan Note (Signed)
Significant problem, inadequately controlled. Plan-reflux precautions reviewed.  Continue acid blocker before meal.  Elevate head of bed.  Referral to GI noting that she also complains of "stomach trouble"

## 2017-04-26 NOTE — Progress Notes (Signed)
Subjective:     Patient ID: Brooke Floyd, female   DOB: 06-04-1965, 51 y.o.   MRN: [id number]  HPI  female never smoker followed for asthma complicated by GERD, DM, GERD, HBP, restless legs  ---------------------------------------------------------------------------------------------------  12/21/16- 51 year old female never smoker followed for asthma complicated by GERD, DM, GERD, HBP, restless legs Pt stated she had a bad asthma attack Sunday, 12/18/16. Stated that she has had an attack x3 days straight where she has had to use both of her inhalers to help  Albuterol( Ventolin or ProAir), Dulera 100, singulair Had a marked episode of wheezing dyspnea after exposure to strong cologne. She used both her rescue and maintenance inhalers several times that day. Otherwise she hasn't felt she needed them consistently. No routine wheeze or sleep disturbance. Now working private duty/sitting for invalids  04/26/17- 51 year old female never smoker followed for asthma complicated by GERD, DM, GERD, HBP, restless legs ---Asthma; Pt had PFT today. Pt would like to have Spacer to use with inhalers as well.  Singulair, albuterol HFA She has not been using Dulera because she had not felt she needed it.  Using rescue inhaler about 3 times a week.  Notices cough and some sleep disturbance. Hoarse for the past week.  Does not recognize an obvious cold. Admits "bad" reflux despite PPI and asks GI referral.  Has adjustable bed but has not been elevating head. Also on an ACEI discussed possible dry cough from this. PFT 04/26/17-Mild to moderate obstructive airways disease with significant response to bronchodilator, mild restriction of total lung capacity, mild reduction of diffusion.  FVC 1.89/71%, FEV1 1.70/80%, ratio 0.90, TLC 71%, DLCO 64%. CXR 12/21/16 IMPRESSION: No active cardiopulmonary disease.  ROS-see HPI   + = pos Constitutional:   No-   weight loss, night sweats, fevers, chills, fatigue,  lassitude.  HEENT:   No-  headaches, difficulty swallowing, tooth/dental problems, sore throat,       No-  sneezing, itching, ear ache,+ nasal congestion, post nasal drip,  CV:  No-   chest pain, orthopnea, PND, swelling in lower extremities, anasarca, dizziness, palpitations Resp: +   shortness of breath with exertion or at rest.              No-   productive cough, +non-productive cough,  No- coughing up of blood.              No-   change in color of mucus.  + wheezing.   Skin: No-   rash or lesions. GI:  No-   heartburn, indigestion, abdominal pain, nausea, vomiting,  GU:  MS:  No-   joint pain or swelling.  . Neuro-     nothing unusual Psych:  No- change in mood or affect. No depression or anxiety.  No memory loss.  OBJ- Physical Exam General- Alert, Oriented, Affect-appropriate, Distress- none acute. Overweight Skin- rash-none, lesions- none, excoriation- none Lymphadenopathy- tender L axilla c/w hydradinitis Head- atraumatic            Eyes- Gross vision intact, PERRLA, conjunctivae and secretions clear            Ears- Hearing, canals-normal            Nose- Clear, no-Septal dev, mucus, polyps, erosion, perforation             Throat- Mallampati III , mucosa+ lightly coated, drainage- none, tonsils- atrophic + hoarse Neck- flexible , trachea midline, no stridor , thyroid nl, carotid no bruit Chest - symmetrical excursion ,  unlabored           Heart/CV- RRR , no murmur , no gallop  , no rub, nl s1 s2                           - JVD- none , edema- none, stasis changes- none, varices- none           Lung- clear, wheeze- none, cough- none , dullness-none, rub- none, unlabored           Chest wall-  Abd-  Br/ Gen/ Rectal- Not done, not indicated Extrem- cyanosis- none, clubbing, none, atrophy- none, strength- nl Neuro- grossly intact to observation

## 2017-04-26 NOTE — Patient Instructions (Addendum)
Script sent refilling Dulera maintenance inhaler    Inhale 2 puffs, through spacer tube, rinse mouth, twice daily every day  Script sent refilling albuterol rescue inhaler  Script sent refilling benzonatate perles for cough  Script printed for aerochamber spacer tube to use with [facility]  Referral to GI    Dx GERD  Please call if needed

## 2017-04-28 ENCOUNTER — Telehealth: Payer: Self-pay | Admitting: Internal Medicine

## 2017-04-28 NOTE — Telephone Encounter (Signed)
Per CY-Insurance will  Pay for Advair or Breo. If PA is denied then we will need to change from [facility] to Breo 100 #1 Inhale 1 puff then rinse mouth well after use once daily with 12 refills. Thanks.

## 2017-04-28 NOTE — Telephone Encounter (Signed)
Received a PA request from [employer] on [address] [place]. PA was started on Symbicort 160 in my [email or web address]. Key is [id number].   Will receive a determination with the next 72-100 hours from [place]. Will route to [facility] for follow up

## 2017-05-01 ENCOUNTER — Telehealth: Payer: Self-pay | Admitting: Pulmonary Disease

## 2017-05-01 MED ORDER — FLUTICASONE-SALMETEROL 250-50 MCG/DOSE IN AEPB: 1.0000 | INHALATION_SPRAY | Freq: Two times a day (BID) | RESPIRATORY_TRACT | 5 refills | Status: DC

## 2017-05-01 NOTE — Telephone Encounter (Signed)
Received call from patient.  She went to pharmacy to get dulera prescription.  She was told that Ruthe Mannan is not covered by her insurance.  She was told that advair or breo would be options.  She has tried advair before, and reports good response from advair.  She would prefer to restart advair.  Have sent script electronically to her pharmacy for advair 250/50 one puff bid and d/c'ed dulera from her medication list.

## 2017-05-10 ENCOUNTER — Encounter: Payer: Managed Care, Other (non HMO) | Admitting: Internal Medicine

## 2017-05-10 NOTE — Progress Notes (Deleted)
   CC: ***  HPI:  [name] is a 52 y.o. with a PMH of ***  Please see problem based Assessment and Plan for status of patients chronic conditions.  Past Medical History:  Diagnosis Date  . GERD (gastroesophageal reflux disease)   . Hyperlipidemia   . Hypertension   . Other symptoms involving nervous and musculoskeletal systems(781.99)   . Type II or unspecified type diabetes mellitus without mention of complication, not stated as uncontrolled    takes insulin  . Unspecified asthma, with exacerbation     Review of Systems:   ROS  Physical Exam:  There were no vitals filed for this visit. GENERAL- alert, co-operative, appears as stated age, not in any distress. HEENT- Atraumatic, normocephalic, PERRL, EOMI, oral mucosa appears moist CARDIAC- RRR, no murmurs, rubs or gallops. RESP- Moving equal volumes of air, and clear to auscultation bilaterally, no wheezes or crackles. ABDOMEN- Soft, nontender, bowel sounds present. NEURO- No obvious Cr N abnormality. EXTREMITIES- pulse 2+, symmetric, no pedal edema. SKIN- Warm, dry, No rash or lesion. PSYCH- Normal mood and affect, appropriate thought content and speech.  Assessment & Plan:   See Encounters Tab for problem based charting.   Patient {GC/GE:3044014::"discussed with","seen with"} Dr. {NAMES:[id number]::"Butcher","Granfortuna","E. Hoffman","Klima","Mullen","Narendra","Vincent"}   Alphonzo Grieve, MD Internal Medicine PGY2

## 2017-05-22 ENCOUNTER — Telehealth: Payer: Self-pay | Admitting: Internal Medicine

## 2017-05-22 MED ORDER — FLUTICASONE FUROATE-VILANTEROL 100-25 MCG/INH IN AEPB: 1.0000 | INHALATION_SPRAY | Freq: Every day | RESPIRATORY_TRACT | 0 refills | Status: DC

## 2017-05-22 NOTE — Telephone Encounter (Signed)
Offer sample trial Breo 100     Inhale 1 puff, then rinse mouth, once daily

## 2017-05-22 NOTE — Telephone Encounter (Signed)
Spoke with pt. She is aware of CY's recommendation. Samples have been left at the front desk. Nothing further was needed.

## 2017-05-22 NOTE — Telephone Encounter (Signed)
Called and spoke to pt. Pt states she has already used Symbicort and it caused her chest pain. Pt states she has tried Symbicort, Advair, and Dulera.   Dr. Annamaria Boots please advise. Thanks  Allergies  Allergen Reactions  . Diclofenac Sodium Itching  . Hydrocodone Itching  . Penicillins Other (See Comments)    convulsions  . Voltaren [Diclofenac] Itching  . Codeine Itching    Current Outpatient Medications on File Prior to Visit  Medication Sig Dispense Refill  . albuterol (PROVENTIL HFA;VENTOLIN HFA) 108 (90 Base) MCG/ACT inhaler Inhale 2 puffs into the lungs every 6 (six) hours as needed for wheezing or shortness of breath. 1 Inhaler 12  . aspirin 81 MG tablet Take 1 tablet (81 mg total) by mouth daily. 90 tablet 2  . B-D UF III MINI PEN NEEDLES 31G X 5 MM MISC USE TO INJECT LANTUS AT BEDTIME AS DIRECTED 100 each 5  . benzonatate (TESSALON) 200 MG capsule Take 1 capsule (200 mg total) by mouth every 8 (eight) hours as needed. 180 capsule 3  . Cholecalciferol (VITAMIN D-3) 5000 UNITS TABS Take 1 tablet by mouth daily.    . Fluticasone-Salmeterol (ADVAIR DISKUS) 250-50 MCG/DOSE AEPB Inhale 1 puff into the lungs 2 (two) times daily. 60 each 5  . gabapentin (NEURONTIN) 300 MG capsule TAKE 3 CAPSULES BY MOUTH 1-2 HOURS BEFORE BEDTIME, MAY TAKE 1 CAPSULE DURING THE DAY 270 capsule 1  . glipiZIDE (GLUCOTROL) 10 MG tablet TAKE ONE TABLET BY MOUTH DAILY BEFORE BREAKFAST 90 tablet 3  . hydrochlorothiazide (MICROZIDE) 12.5 MG capsule Take 1 capsule (12.5 mg total) by mouth daily. 90 capsule 3  . Insulin Glargine (BASAGLAR KWIKPEN) 100 UNIT/ML SOPN Inject 0.3 mLs (30 Units total) into the skin at bedtime. 30 mL 2  . Insulin Pen Needle (PEN NEEDLES 3/16") 31G X 5 MM MISC Use to inject Lantus at bedtime as instructed. 100 each 6  . JANUVIA 100 MG tablet TAKE ONE TABLET BY MOUTH DAILY 90 tablet 3  . metFORMIN (GLUCOPHAGE) 1000 MG tablet TAKE 1 TABLET TWICE A DAY WITH MEALS 180 tablet 3  . montelukast  (SINGULAIR) 10 MG tablet Take 1 tablet (10 mg total) by mouth at bedtime. 90 tablet 3  . pantoprazole (PROTONIX) 40 MG tablet Take 1 tablet (40 mg total) by mouth daily. 90 tablet 1  . pramipexole (MIRAPEX) 0.5 MG tablet TAKE ONE TABLET BY MOUTH DAILY AT BEDTIME 90 tablet 1  . quinapril (ACCUPRIL) 40 MG tablet TAKE 1 TABLET BY MOUTH DAILY 90 tablet 3  . simvastatin (ZOCOR) 80 MG tablet TAKE 1 TABLET BY MOUTH BEFORE BEDTIME 90 tablet 3  . Spacer/Aero Chamber Mouthpiece MISC 1 application by Does not apply route 2 (two) times daily. 1 each 0  . Spacer/Aero-Holding Chambers (AEROCHAMBER MV) inhaler Use as instructed 1 each 0   No current facility-administered medications on file prior to visit.

## 2017-05-22 NOTE — Telephone Encounter (Signed)
Is she comfortable that she is inhaling Advair correctly so it gets into her chest? We probably switched from [facility] due to insurance coverage. Can we figure out if her insurance would cover Symbicort 160, #1,   Inhale 2 puffs, then rinse mouth, twice daily, sample now and refill x 11.

## 2017-05-22 NOTE — Telephone Encounter (Signed)
Called pt who stated she was switched from [facility] to [place] and has had no relief from the Advair with her asthma.  Pt has been having to use her rescue inhaler at least twice a day due to having no relief from the Advair.  States SOB is worse and even having problems with asthma flare-ups with different smells and when she goes outside.  Dr. Annamaria Boots, please advise if there is anything different we can put pt on.  Thanks!  Allergies  Allergen Reactions  . Diclofenac Sodium Itching  . Hydrocodone Itching  . Penicillins Other (See Comments)    convulsions  . Voltaren [Diclofenac] Itching  . Codeine Itching     Current Outpatient Medications:  .  albuterol (PROVENTIL HFA;VENTOLIN HFA) 108 (90 Base) MCG/ACT inhaler, Inhale 2 puffs into the lungs every 6 (six) hours as needed for wheezing or shortness of breath., Disp: 1 Inhaler, Rfl: 12 .  aspirin 81 MG tablet, Take 1 tablet (81 mg total) by mouth daily., Disp: 90 tablet, Rfl: 2 .  B-D UF III MINI PEN NEEDLES 31G X 5 MM MISC, USE TO INJECT LANTUS AT BEDTIME AS DIRECTED, Disp: 100 each, Rfl: 5 .  benzonatate (TESSALON) 200 MG capsule, Take 1 capsule (200 mg total) by mouth every 8 (eight) hours as needed., Disp: 180 capsule, Rfl: 3 .  Cholecalciferol (VITAMIN D-3) 5000 UNITS TABS, Take 1 tablet by mouth daily., Disp: , Rfl:  .  Fluticasone-Salmeterol (ADVAIR DISKUS) 250-50 MCG/DOSE AEPB, Inhale 1 puff into the lungs 2 (two) times daily., Disp: 60 each, Rfl: 5 .  gabapentin (NEURONTIN) 300 MG capsule, TAKE 3 CAPSULES BY MOUTH 1-2 HOURS BEFORE BEDTIME, MAY TAKE 1 CAPSULE DURING THE DAY, Disp: 270 capsule, Rfl: 1 .  glipiZIDE (GLUCOTROL) 10 MG tablet, TAKE ONE TABLET BY MOUTH DAILY BEFORE BREAKFAST, Disp: 90 tablet, Rfl: 3 .  hydrochlorothiazide (MICROZIDE) 12.5 MG capsule, Take 1 capsule (12.5 mg total) by mouth daily., Disp: 90 capsule, Rfl: 3 .  Insulin Glargine (BASAGLAR KWIKPEN) 100 UNIT/ML SOPN, Inject 0.3 mLs (30 Units total) into the skin at  bedtime., Disp: 30 mL, Rfl: 2 .  Insulin Pen Needle (PEN NEEDLES 3/16") 31G X 5 MM MISC, Use to inject Lantus at bedtime as instructed., Disp: 100 each, Rfl: 6 .  JANUVIA 100 MG tablet, TAKE ONE TABLET BY MOUTH DAILY, Disp: 90 tablet, Rfl: 3 .  metFORMIN (GLUCOPHAGE) 1000 MG tablet, TAKE 1 TABLET TWICE A DAY WITH MEALS, Disp: 180 tablet, Rfl: 3 .  montelukast (SINGULAIR) 10 MG tablet, Take 1 tablet (10 mg total) by mouth at bedtime., Disp: 90 tablet, Rfl: 3 .  pantoprazole (PROTONIX) 40 MG tablet, Take 1 tablet (40 mg total) by mouth daily., Disp: 90 tablet, Rfl: 1 .  pramipexole (MIRAPEX) 0.5 MG tablet, TAKE ONE TABLET BY MOUTH DAILY AT BEDTIME, Disp: 90 tablet, Rfl: 1 .  quinapril (ACCUPRIL) 40 MG tablet, TAKE 1 TABLET BY MOUTH DAILY, Disp: 90 tablet, Rfl: 3 .  simvastatin (ZOCOR) 80 MG tablet, TAKE 1 TABLET BY MOUTH BEFORE BEDTIME, Disp: 90 tablet, Rfl: 3 .  Spacer/Aero Chamber Mouthpiece MISC, 1 application by Does not apply route 2 (two) times daily., Disp: 1 each, Rfl: 0 .  Spacer/Aero-Holding Chambers (AEROCHAMBER MV) inhaler, Use as instructed, Disp: 1 each, Rfl: 0

## 2017-05-24 ENCOUNTER — Encounter: Payer: Self-pay | Admitting: Internal Medicine

## 2017-05-24 ENCOUNTER — Ambulatory Visit: Payer: BLUE CROSS/BLUE SHIELD | Admitting: Internal Medicine

## 2017-05-24 ENCOUNTER — Telehealth: Payer: Self-pay | Admitting: Dietician

## 2017-05-24 VITALS — BP 138/66 | HR 89 | Temp 98.0°F | Ht 62.0 in | Wt 135.6 lb

## 2017-05-24 DIAGNOSIS — E785 Hyperlipidemia, unspecified: Secondary | ICD-10-CM | POA: Diagnosis not present

## 2017-05-24 DIAGNOSIS — J45901 Unspecified asthma with (acute) exacerbation: Secondary | ICD-10-CM | POA: Diagnosis not present

## 2017-05-24 DIAGNOSIS — Z794 Long term (current) use of insulin: Secondary | ICD-10-CM

## 2017-05-24 DIAGNOSIS — R490 Dysphonia: Secondary | ICD-10-CM | POA: Diagnosis not present

## 2017-05-24 DIAGNOSIS — Z79899 Other long term (current) drug therapy: Secondary | ICD-10-CM | POA: Diagnosis not present

## 2017-05-24 DIAGNOSIS — E119 Type 2 diabetes mellitus without complications: Secondary | ICD-10-CM | POA: Diagnosis not present

## 2017-05-24 DIAGNOSIS — I10 Essential (primary) hypertension: Secondary | ICD-10-CM

## 2017-05-24 DIAGNOSIS — J4541 Moderate persistent asthma with (acute) exacerbation: Secondary | ICD-10-CM

## 2017-05-24 DIAGNOSIS — K219 Gastro-esophageal reflux disease without esophagitis: Secondary | ICD-10-CM | POA: Diagnosis not present

## 2017-05-24 LAB — POCT GLYCOSYLATED HEMOGLOBIN (HGB A1C): Hemoglobin A1C: 7.6

## 2017-05-24 LAB — GLUCOSE, CAPILLARY: Glucose-Capillary: 185 mg/dL — ABNORMAL HIGH (ref 65–99)

## 2017-05-24 MED ORDER — PREDNISONE 20 MG PO TABS
60.0000 mg | ORAL_TABLET | Freq: Every day | ORAL | 0 refills | Status: AC
Start: 2017-05-24 — End: 2017-05-31

## 2017-05-24 NOTE — Patient Instructions (Signed)
For your diabetes, continue your current medicines. Keep doing a great job of decreasing your carb intake - things to avoid are fatty foods, fried foods, white breads, pasta, crackers.  For your asthma take prednisone 60mg  daily for 7 day.

## 2017-05-24 NOTE — Telephone Encounter (Signed)
Steroid-Induced Hyperglycemia Prevention and Management Brooke Floyd is a 52 y.o. female who meets criteria for [facility] glucose monitoring program (diabetes patient prescribed short course of steroids).  A/P Current Regimen  Patient prescribed prednisone 60 mg daily x 7 days, currently on day 0 of therapy.  Prednisone indication: asthma  Current DM regimen metformin, januvia, glipizide  Home BG Monitoring  Patient does have a meter at home and does check BG at home. Meter was not supplied.  CBGs at home unknown   A1C prior to steroid course 7.6%  S/Sx of hyper- or hypoglycemia: unknown  Medication Management  Additional treatment for BG control is not indicated at this time.  Physician preference level per protocol: 2  Patient Education  Advised patient to monitor BG while on steroid therapy (at least twice daily prior to first 2 meals of the day). .  Follow-up tomorrow by phone  per Dr. Jari Favre- 3 months or sooner if needed  Butch Penny Plyler 5:11 PM 05/24/2017

## 2017-05-24 NOTE — Progress Notes (Signed)
   CC: asthma exacerbation  HPI:  [name] is a 52 y.o. with a PMH of asthma, GERD, HTN, HLD, T2DM presenting to clinic for f/u on T2DM, HTN, and evaluation of hoarse voice.  T2DM: Patient with last A1c of 7.2 in Oct 2018. She is compliant with Basaglar 30 units qhs, januvia 100mg  daily, glipizide 10mg  daily and metformin 1000mg  BID. She endorses not being as careful about what she eat for several weeks but has adjusted her diet last week. She endorses stopping drinking iced coffees, fried foods. She denies polyuria, polydipsia, n/v, blurry vision. She denies hypoglycemic episodes.  HTN: Patient compliant with quinapril 40mg  daily and HCTZ 12.5mg  daily. She denies chest pain, LE swelling, headaches, vision or hearing changes, focal weakness.  Asthma exacerbation: Patient with 1wk h/o hoarse voice, mild dyspnea and wheezing that is associated with rhinorrhea, mildly productive cough, and nasal congestion. She denies fevers, chills, n/v/d.  Please see problem based Assessment and Plan for status of patients chronic conditions.  Past Medical History:  Diagnosis Date  . GERD (gastroesophageal reflux disease)   . Hyperlipidemia   . Hypertension   . Thrombocytosis ([place]) 02/08/2017  . Type 2 diabetes mellitus (HCC)    insulin dependent    Review of Systems:   ROS Per HPI  Physical Exam:  Vitals:   05/24/17 1457  BP: 138/66  Pulse: 89  Temp: 98 F (36.7 C)  TempSrc: Oral  SpO2: 98%  Weight: 135 lb 9.6 oz (61.5 kg)  Height: 5\' 2"  (1.575 m)   GENERAL- alert, co-operative, appears as stated age, not in any distress. HEENT- oral mucosa appears moist, no pharyngeal erythema/exudates, no lymphadenopathy, no sinus tenderness CARDIAC- RRR, no murmurs, rubs or gallops. RESP- Decreased breath sounds throughout, no wheezing or rales. No increased work of breathing; speaking in full sentences. ABDOMEN- Soft, nontender NEURO- No obvious Cr N abnormality. EXTREMITIES- pulse 2+  PT, symmetric, no pedal edema.  Assessment & Plan:   See Encounters Tab for problem based charting.   Patient discussed with Dr. Gerrit Friends, MD Internal Medicine PGY2

## 2017-05-25 ENCOUNTER — Encounter: Payer: Self-pay | Admitting: Internal Medicine

## 2017-05-25 NOTE — Assessment & Plan Note (Signed)
Patient with 1wk h/o hoarse voice, mild dyspnea, chest tightness and wheezing that is associated with rhinorrhea, mildly productive cough, and nasal congestion. She denies fevers, chills, n/v/d.  Exam with decreased breath sounds throughout but w/o wheezing or rales. She has no increased work of breathing and is saturating well on RA. Asthma exacerbation likely triggered by viral URI; no s/sx of pneumonia.  Plan: --patient to continue Breo, albuterol PRN for shortness of breath --prednisone 60mg  daily x7 days; patient to call if symptoms not resolved at end of course

## 2017-05-25 NOTE — Assessment & Plan Note (Signed)
Well controlled.  Plan: --continue quinapril 40mg  daily and HCTZ 12.5mg  daily

## 2017-05-25 NOTE — Assessment & Plan Note (Signed)
Patient with last A1c of 7.2 in Oct 2018. She is compliant with Basaglar 30 units qhs, januvia 100mg  daily, glipizide 10mg  daily and metformin 1000mg  BID. She endorses not being as careful about what she eat for several weeks but has adjusted her diet last week. She endorses stopping drinking iced coffees, fried foods. She denies polyuria, polydipsia, n/v, blurry vision. She denies hypoglycemic episodes.  Plan: --A1c today 7.7 - worsening in setting of dietary indescretion --patient has already starting making dietary changes which have helped her maintain goal A1c in past and would like to continue with this instead of a medication adjustment at this time which is reasonable. --continue basaglar 30 units qhs --continue januvia 100mg  daily --continue glipizide 10mg  daily --continue metformin 1000mg  BID --f/u in 3 mo for A1c

## 2017-05-25 NOTE — Telephone Encounter (Addendum)
Prevention of steroid Induced hyperglycemia Follow up Call:  Started steroids yesterday afternoon.She has not checked her blood sugar today and does not have her meter with her. She'll take steroids again this afternoon and then start I the am tomorrow. She is feeling better.  Plan for follow up: tomorrow PM for blood sugars

## 2017-05-26 ENCOUNTER — Other Ambulatory Visit: Payer: Self-pay | Admitting: *Deleted

## 2017-05-26 NOTE — Telephone Encounter (Signed)
Information only 

## 2017-05-26 NOTE — Telephone Encounter (Signed)
05/26/2017 Prevention of steroid Induced hyperglycemia Follow up Call:  Today is day 3 of 7 days of 60 mg prednisone. Her blood sugar was 95 11 am this morning. Took steroids about the same time. She does ot have her meter with her so she can check her blood sugar now. She was educated and verbalized understanding about calling the resident on call if she has elevated blood sugars (per protocol two or more >200 mg/dl) over the upcoming holiday weekend.   Coughing up yellow sputum and complains that it is bothering her sleep at night- wants to talk to triage nurse.   The triage nurse spoke to her about what to do for her cough. Follow up Tuesday 05/30/17.

## 2017-05-29 ENCOUNTER — Telehealth: Payer: Self-pay

## 2017-05-29 NOTE — Telephone Encounter (Signed)
Brooke Floyd [name] - Rx #: [id number] Need help? Call us at [phone]  Archivedon January 18 Outcome  Unknown  DrugDulera 100-5MCG/ACT aerosol  [employer] Electronic PA Form (NCPDP)  Original Claim Info75    She was changed to breo 100 so will disregard PA for [facility]

## 2017-05-30 ENCOUNTER — Telehealth: Payer: Self-pay | Admitting: Internal Medicine

## 2017-05-30 DIAGNOSIS — J4541 Moderate persistent asthma with (acute) exacerbation: Secondary | ICD-10-CM

## 2017-05-30 MED ORDER — AZITHROMYCIN 250 MG PO TABS: ORAL_TABLET | ORAL | 0 refills | Status: AC

## 2017-05-30 NOTE — Telephone Encounter (Signed)
Called but no answer, sent msg to PCP

## 2017-05-30 NOTE — Telephone Encounter (Signed)
Called patient back. She states that she is still having a significant productive cough with yellow sputum. She states resolution of her wheezing; she still has some hoarseness but this is improved. She denies myalgias, fevers, chills but overall does not feel well.   She has completed a 7 day course of prednisone 60mg  daily for asthma exacerbation.  Will send in for azithromycin 5 day course.  Discussed with Dr. Daryll Drown.  Alphonzo Grieve, MD IMTS - PGY2

## 2017-05-30 NOTE — Progress Notes (Signed)
Internal Medicine Clinic Attending  Case discussed with Dr. Svalina  at the time of the visit.  We reviewed the resident's history and exam and pertinent patient test results.  I agree with the assessment, diagnosis, and plan of care documented in the resident's note.  

## 2017-05-30 NOTE — Telephone Encounter (Signed)
Patient called cough no better and medications is still not working.  Offered patient an appointment.  Pt wants to speak to [facility] because she is still coughing up yellow sputum and was told to her back if no better and our office was closed yesterday and she would rather have medicine called in. Please call patient back as soon as possible.

## 2017-06-07 ENCOUNTER — Telehealth: Payer: Self-pay | Admitting: Internal Medicine

## 2017-06-07 MED ORDER — FLUTICASONE FUROATE-VILANTEROL 100-25 MCG/INH IN AEPB: 1.0000 | INHALATION_SPRAY | Freq: Every day | RESPIRATORY_TRACT | 5 refills | Status: DC

## 2017-06-07 NOTE — Telephone Encounter (Signed)
Breo inh

## 2017-06-07 NOTE — Telephone Encounter (Signed)
Called and spoke with pt, who request Rx for Breo 100. Pt feels that [facility] is effective.  Rx for Breo 100 has been sent to preferred pharmacy. Nothing further is needed.

## 2017-06-15 ENCOUNTER — Telehealth: Payer: Self-pay | Admitting: Internal Medicine

## 2017-06-15 NOTE — Telephone Encounter (Signed)
Called Ms. Denard per her request. She states she does not feel well, main symptoms is a little fatigue and hoarse voice. She denies fevers, chills, shortness of breath, chest pain; she does endorse a mild cough that is not bothersome or worrisome to her. Her breathing and cough feel significantly better than last appt.   She states she has "vomit" and a bad taste in her mouth each morning. She endorses uncontrolled acid reflux with Zantac BID. She denies abd pain, N/V. We have tried PPI in the past, but insurance does not cover it and she did not want to get OTC PPI. We had also discussed diet changes some of which she has implemented like decreasing caffeine, however she does eat a lot of fried foods per our last clinic discussion. She does have appt with GI tomorrow for this, for which she was referred to by pulm.   Patient declined appt visit with [place], but states if she feels worse today, will call the clinic to set up appt.   She also wanted to know her LDL level, which at last check 11/17 was 93. She is on Simvastatin 80mg  daily and states compliance.  She had no further questions or concerns.  Alphonzo Grieve, MD IMTS - PGY2

## 2017-06-15 NOTE — Telephone Encounter (Signed)
WOULD LIKE TO KNOW HER LDL LEVEL. NOT FEELING GOOD

## 2017-06-16 ENCOUNTER — Ambulatory Visit (INDEPENDENT_AMBULATORY_CARE_PROVIDER_SITE_OTHER): Payer: BLUE CROSS/BLUE SHIELD | Admitting: Gastroenterology

## 2017-06-16 ENCOUNTER — Encounter: Payer: Self-pay | Admitting: Gastroenterology

## 2017-06-16 VITALS — BP 108/76 | HR 72 | Ht 62.0 in | Wt 130.0 lb

## 2017-06-16 DIAGNOSIS — K219 Gastro-esophageal reflux disease without esophagitis: Secondary | ICD-10-CM

## 2017-06-16 DIAGNOSIS — R1110 Vomiting, unspecified: Secondary | ICD-10-CM

## 2017-06-16 DIAGNOSIS — R12 Heartburn: Secondary | ICD-10-CM | POA: Diagnosis not present

## 2017-06-16 DIAGNOSIS — R110 Nausea: Secondary | ICD-10-CM | POA: Diagnosis not present

## 2017-06-16 DIAGNOSIS — R197 Diarrhea, unspecified: Secondary | ICD-10-CM

## 2017-06-16 MED ORDER — RANITIDINE HCL 300 MG PO TABS: 300.0000 mg | ORAL_TABLET | Freq: Every day | ORAL | 3 refills | Status: DC

## 2017-06-16 NOTE — Patient Instructions (Signed)
If you are age 52 or older, your body mass index should be between 23-30. Your Body mass index is 23.78 kg/m. If this is out of the aforementioned range listed, please consider follow up with your Primary Care Provider.  If you are age 37 or younger, your body mass index should be between 19-25. Your Body mass index is 23.78 kg/m. If this is out of the aformentioned range listed, please consider follow up with your Primary Care Provider.   You have been scheduled for an endoscopy. Please follow written instructions given to you at your visit today. If you use inhalers (even only as needed), please bring them with you on the day of your procedure. Your physician has requested that you go to www.startemmi.com and enter the access code given to you at your visit today. This web site gives a general overview about your procedure. However, you should still follow specific instructions given to you by our office regarding your preparation for the procedure.  We have sent the following medications to your pharmacy for you to pick up at your convenience: Zantac 300 mg Align samples given for 2 weeks.  Anti-reflux measures information given to patient.  Follow up in 3-4 months.  Will contact you with an appointment when schedule is available.  Thank you for choosing me and [facility].  Pat Kocher, MD

## 2017-06-16 NOTE — Progress Notes (Signed)
Brooke Floyd    [id number]    22-Apr-1966  Primary Care Physician:Svalina, Estill Dooms, MD  Referring Physician: Alphonzo Grieve, MD [place], [place] [address]  Chief complaint:  GERD, nausea, diarrhea, abdominal pain  HPI: 89 yr F here for new patient visit with  Multiple GI complaints. She has had chronic heartburn and reflux symptoms for past 5-6 years. +nausea and regurgitation of liquids and sometimes food when she lays down. She wakes up in the morning with acid taste and hoarseness of voice, sore throat. She was on Protonix briefly but discontinued it as was not effective. She has also tried multiple different PPI's (Prilosec, Nexium and Prevacid) with no significant improvement She is currently on OTC Zantac 150mg  BID with some improvement but continues to have breakthrough heartburn and is needed to take Tums. She is trying to change her lifestyle. She decreased the meal portion and also eating early supper  She had bronchitis 2 weeks ago, was on antibiotics. While she was taking antibiotics, developed diarrhea with 3-4 loose bowel movements daily, since she is off antibiotics, bowel habits are returning back to normal. Denies having loose stool still. No blood in stool or nocturnal symptoms. She has mild lower abdominal discomfort. No fever or chills.   Patient is not very forth coming with history, vague with symptoms and also what was done in the past.    Outpatient Encounter Medications as of 06/16/2017  Medication Sig  . albuterol (PROVENTIL HFA;VENTOLIN HFA) 108 (90 Base) MCG/ACT inhaler Inhale 2 puffs into the lungs every 6 (six) hours as needed for wheezing or shortness of breath.  Marland Kitchen aspirin 81 MG tablet Take 1 tablet (81 mg total) by mouth daily.  . B-D UF III MINI PEN NEEDLES 31G X 5 MM MISC USE TO INJECT LANTUS AT BEDTIME AS DIRECTED  . benzonatate (TESSALON) 200 MG capsule Take 1 capsule (200 mg total) by mouth every 8 (eight) hours as needed.  .  Cholecalciferol (VITAMIN D-3) 5000 UNITS TABS Take 1 tablet by mouth daily.  . fluticasone furoate-vilanterol (BREO ELLIPTA) 100-25 MCG/INH AEPB Inhale 1 puff into the lungs daily.  . Fluticasone-Salmeterol (ADVAIR DISKUS) 250-50 MCG/DOSE AEPB Inhale 1 puff into the lungs 2 (two) times daily.  Marland Kitchen gabapentin (NEURONTIN) 300 MG capsule TAKE 3 CAPSULES BY MOUTH 1-2 HOURS BEFORE BEDTIME, MAY TAKE 1 CAPSULE DURING THE DAY  . glipiZIDE (GLUCOTROL) 10 MG tablet TAKE ONE TABLET BY MOUTH DAILY BEFORE BREAKFAST  . hydrochlorothiazide (MICROZIDE) 12.5 MG capsule Take 1 capsule (12.5 mg total) by mouth daily.  . Insulin Glargine (BASAGLAR KWIKPEN) 100 UNIT/ML SOPN Inject 0.3 mLs (30 Units total) into the skin at bedtime.  . Insulin Pen Needle (PEN NEEDLES 3/16") 31G X 5 MM MISC Use to inject Lantus at bedtime as instructed.  Marland Kitchen JANUVIA 100 MG tablet TAKE ONE TABLET BY MOUTH DAILY  . metFORMIN (GLUCOPHAGE) 1000 MG tablet TAKE 1 TABLET TWICE A DAY WITH MEALS  . montelukast (SINGULAIR) 10 MG tablet Take 1 tablet (10 mg total) by mouth at bedtime.  . pantoprazole (PROTONIX) 40 MG tablet Take 1 tablet (40 mg total) by mouth daily.  . pramipexole (MIRAPEX) 0.5 MG tablet TAKE ONE TABLET BY MOUTH DAILY AT BEDTIME  . quinapril (ACCUPRIL) 40 MG tablet TAKE 1 TABLET BY MOUTH DAILY  . simvastatin (ZOCOR) 80 MG tablet TAKE 1 TABLET BY MOUTH BEFORE BEDTIME  . Spacer/Aero Chamber Mouthpiece MISC 1 application by  Does not apply route 2 (two) times daily.  Marland Kitchen Spacer/Aero-Holding Chambers (AEROCHAMBER MV) inhaler Use as instructed   No facility-administered encounter medications on file as of 06/16/2017.     Allergies as of 06/16/2017 - Review Complete 05/25/2017  Allergen Reaction Noted  . Diclofenac sodium Itching 01/11/2008  . Hydrocodone Itching 11/11/2016  . Penicillins Other (See Comments) 01/11/2008  . Voltaren [diclofenac] Itching 11/11/2016  . Codeine Itching 02/19/2016    Past Medical History:  Diagnosis Date    . GERD (gastroesophageal reflux disease)   . Hyperlipidemia   . Hypertension   . Thrombocytosis ([place]) 02/08/2017  . Type 2 diabetes mellitus (HCC)    insulin dependent    Past Surgical History:  Procedure Laterality Date  . COMBINED HYSTERECTOMY ABDOMINAL W/ A&P REPAIR / OOPHORECTOMY    . WRIST ARTHROSCOPY      Family History  Problem Relation Age of Onset  . Coronary artery disease Father   . Diabetes Father   . Diabetes Mother   . Breast cancer Neg Hx   . Colon cancer Neg Hx     Social History   Socioeconomic History  . Marital status: Married    Spouse name: Not on file  . Number of children: 2  . Years of education: 17  . Highest education level: Not on file  Social Needs  . Financial resource strain: Not on file  . Food insecurity - worry: Not on file  . Food insecurity - inability: Not on file  . Transportation needs - medical: Not on file  . Transportation needs - non-medical: Not on file  Occupational History  . Occupation: CNA  Tobacco Use  . Smoking status: Never Smoker  . Smokeless tobacco: Never Used  Substance and Sexual Activity  . Alcohol use: No    Alcohol/week: 0.0 oz  . Drug use: No  . Sexual activity: Not Currently    Birth control/protection: Surgical  Other Topics Concern  . Not on file  Social History Narrative  . Not on file      Review of systems: Review of Systems  Constitutional: Negative for fever and chills.  HENT: Negative.   Eyes: Negative for blurred vision.  Respiratory: Negative for cough, shortness of breath and wheezing.   Cardiovascular: Negative for chest pain and palpitations.  Gastrointestinal: as per HPI Genitourinary: Negative for dysuria, urgency, frequency and hematuria.  Musculoskeletal: Negative for myalgias, back pain and joint pain.  Skin: Negative for itching and rash.  Neurological: Negative for dizziness, tremors, focal weakness, seizures and loss of consciousness.  Endo/Heme/Allergies: Positive for  seasonal allergies.  Psychiatric/Behavioral: Negative for depression, suicidal ideas and hallucinations.  All other systems reviewed and are negative.   Physical Exam: Vitals:   06/16/17 0852  BP: 108/76  Pulse: 72   Body mass index is 23.78 kg/m. Gen:      No acute distress HEENT:  EOMI, sclera anicteric Neck:     No masses; no thyromegaly Lungs:    Clear to auscultation bilaterally; normal respiratory effort CV:         Regular rate and rhythm; no murmurs Abd:      + bowel sounds; soft, non-tender; no palpable masses, no distension Ext:    No edema; adequate peripheral perfusion Skin:      Warm and dry; no rash Neuro: alert and oriented x 3 Psych: normal mood and affect  Data Reviewed:  Reviewed labs, radiology imaging, old records and pertinent past GI work up  Colonoscopy  11/23/2016 - One 7 mm polyp in the ascending colon, removed with a cold snare. Resected and retrieved. - The examination was otherwise normal. - TUBULAR ADENOMA. - NO HIGH GRADE DYSPLASIA OR MALIGNANCY.  Assessment and Plan/Recommendations:  44 yr F with chronic GERD, sore throat , nausea, regurgitation and persistent breakthrough heartburn Symptoms are suggestive of uncontrolled GERD Patient is reluctant to take PPI, she feels they were not working and making her symptoms worse As she mostly has nocturnal reflux symptoms, will try Zantac 300 mg at bedtime daily Schedule for EGD to evaluate The risks and benefits as well as alternatives of endoscopic procedure(s) have been discussed and reviewed. All questions answered. The patient agrees to proceed. Discussed anti reflux measures and life style modifications in detail  Diarrhea associated with recent antibiotic use, is resolving Advised patient to take probiotics for 2 weeks, Align 1 capsule daily If she has recurrent diarrhea, will need to check stool C.diff  Return in 3-4 months or sooner if needed  K. Denzil Magnuson , MD [phone] Mon-Fri  8a-5p [phone] after 5p, weekends, holidays  CC: Alphonzo Grieve, MD

## 2017-06-19 ENCOUNTER — Telehealth: Payer: Self-pay | Admitting: Gastroenterology

## 2017-06-19 NOTE — Telephone Encounter (Signed)
Patient canceled procedure for this Wednesday 2.13.19 today due to wanting to try the medication Dr.Nandigam gave her before doing procedure. Patient states she will cb to resch procedure if medication does not work.

## 2017-06-19 NOTE — Telephone Encounter (Signed)
ok 

## 2017-06-21 ENCOUNTER — Encounter: Payer: BLUE CROSS/BLUE SHIELD | Admitting: Gastroenterology

## 2017-06-26 ENCOUNTER — Telehealth: Payer: Self-pay | Admitting: *Deleted

## 2017-06-26 NOTE — Telephone Encounter (Signed)
Information was faxeded to [facility]  For Basaglar Flex meds.  [place],RM

## 2017-07-21 DIAGNOSIS — E113513 Type 2 diabetes mellitus with proliferative diabetic retinopathy with macular edema, bilateral: Secondary | ICD-10-CM | POA: Diagnosis not present

## 2017-07-21 DIAGNOSIS — H35033 Hypertensive retinopathy, bilateral: Secondary | ICD-10-CM | POA: Diagnosis not present

## 2017-07-21 DIAGNOSIS — H2513 Age-related nuclear cataract, bilateral: Secondary | ICD-10-CM | POA: Diagnosis not present

## 2017-07-26 ENCOUNTER — Ambulatory Visit: Payer: BLUE CROSS/BLUE SHIELD

## 2017-08-24 ENCOUNTER — Encounter: Payer: Self-pay | Admitting: Gastroenterology

## 2017-09-02 ENCOUNTER — Other Ambulatory Visit: Payer: Self-pay | Admitting: Internal Medicine

## 2017-09-02 DIAGNOSIS — G2581 Restless legs syndrome: Secondary | ICD-10-CM

## 2017-09-02 DIAGNOSIS — R232 Flushing: Secondary | ICD-10-CM

## 2017-09-07 ENCOUNTER — Other Ambulatory Visit: Payer: Self-pay | Admitting: Family Medicine

## 2017-09-07 DIAGNOSIS — Z1231 Encounter for screening mammogram for malignant neoplasm of breast: Secondary | ICD-10-CM

## 2017-09-29 ENCOUNTER — Other Ambulatory Visit: Payer: Self-pay | Admitting: Internal Medicine

## 2017-09-30 ENCOUNTER — Other Ambulatory Visit: Payer: Self-pay | Admitting: Internal Medicine

## 2017-09-30 DIAGNOSIS — G2581 Restless legs syndrome: Secondary | ICD-10-CM

## 2017-10-11 ENCOUNTER — Encounter: Payer: BLUE CROSS/BLUE SHIELD | Admitting: Internal Medicine

## 2017-10-25 ENCOUNTER — Ambulatory Visit: Payer: Managed Care, Other (non HMO) | Admitting: Internal Medicine

## 2017-11-01 ENCOUNTER — Ambulatory Visit
Admission: RE | Admit: 2017-11-01 | Discharge: 2017-11-01 | Disposition: A | Discharge status: Home / Self Care | Payer: BLUE CROSS/BLUE SHIELD | Source: Ambulatory Visit | Attending: Family Medicine | Admitting: Family Medicine

## 2017-11-01 ENCOUNTER — Other Ambulatory Visit: Payer: Self-pay | Admitting: Gastroenterology

## 2017-11-01 DIAGNOSIS — Z1231 Encounter for screening mammogram for malignant neoplasm of breast: Secondary | ICD-10-CM

## 2017-11-14 NOTE — Progress Notes (Signed)
CC: diabetes  HPI:  [name] is a 52 y.o. with a PMH of asthma, GERD, HTN, HLD, T2DM presenting to clinic for f/u on T2DM, HTN, GERD.  T2DM: Last A1c 7.7 in Jan 2019. She has stopped Januvia due to generalized unwell feeling which improved after stopping; she endorses taking metformin 1000mg  BID but ran out a week or so ago; she states she usually takes basaglar. She denies polyuria and polydipsia; she endorses nausea w/o vomiting but is able to tolerate PO intake. She states that prior to a cruise in May she had made significant diet changes and cut out fried foods, fatty foods, red meat, however has reverted back to that diet during and after vacation.   GERD: Patient endorses uncontrolled GERD on Zantac. She was referred to GI for EGD and seen in February at which time she again declined PPI's due to ineffective control of symptoms in the past and decided to post-pone her EGD until her insurance changes at the beginning of the year. She denies abd pain, hematochezia or melena. Prior to her vacation, her diet changes had made the GERD symptoms much better, however now they are worse again.   HTN: Patient endorses compliance with quinapril 40mg  daily and HCTZ 12.5mg  daily. She denies chest pain or shortness of breath.  Patient also complains of tension type headache which is usual for her when she has a busy day - she denies vision or hearing changes, weakness, numbness/tingling, dizziness; she also complains of left wrist pain that is also chronic and flares up from time to time related to a previous surgery.   Please see problem based Assessment and Plan for status of patients chronic conditions.  Past Medical History:  Diagnosis Date  . GERD (gastroesophageal reflux disease)   . Hyperlipidemia   . Hypertension   . Thrombocytosis ([place]) 02/08/2017  . Type 2 diabetes mellitus (HCC)    insulin dependent    Review of Systems:   ROS Per HPI  Physical Exam:  Vitals:   11/15/17 1403  BP: 124/65  Pulse: 79  Temp: 97.7 F (36.5 C)  TempSrc: Oral  SpO2: 99%  Weight: 133 lb (60.3 kg)  Height: 5\' 2"  (1.575 m)   GENERAL- alert, co-operative, appears as stated age, not in any distress. HEENT- EOMI, oral mucosa appears moist. CARDIAC- RRR, no murmurs, rubs or gallops. RESP- Moving equal volumes of air, and clear to auscultation bilaterally, no wheezes or crackles. ABDOMEN- Soft, nontender, bowel sounds present. EXTREMITIES- pulse 2+, symmetric, no pedal edema. SKIN- Warm, dry, no rash or lesion.  Assessment & Plan:   See Encounters Tab for problem based charting.  HTN (hypertension) Well controlled and asymptomatic.  Plan: --continue quinapril 40mg  daily and HCTZ 12.5mg  daily --f/u Bmet - normal renal function  GERD Patient endorses uncontrolled GERD on Zantac. She was referred to GI for EGD and seen in February at which time she again declined PPI's due to ineffective control of symptoms in the past and decided to post-pone her EGD until her insurance changes at the beginning of the year. She denies abd pain, hematochezia or melena. Prior to her vacation, her diet changes had made the GERD symptoms much better, however now they are worse again.   Plan: --continue zantac --discussed starting PPI again, however patient declined --encouraged patient to pursue EGD as soon as she can due to years worth of uncontrolled GERD symptoms --encouraged diet changes which seemed to at least improve her symptoms  Diabetes mellitus  without complication (HCC) Uncontrolled, worsened with A1c 8.7 today.   Based on discussion with patient today it is uncertain that she is actually taking any insulin. She stopped Januvia due to making her feel "worse". She is taking glipizide and metformin, it seems regularly.  Plan: --change metformin > empagliflozin-metformin 5-1000mg  BID (cheaper option for patient) --continue glipizide 10mg  daily --stop basaglar --due to her  GI symptoms currently, will likely not pursue starting GLP1 agonists --f/u in 1 month, if tolerating will increase to empagliflozin-metformin 12.5-1000mg  BID; she is to return earlier if symptoms of hyperglycemia worsen    Patient discussed with Dr. Nilsa Nutting, MD Internal Medicine PGY-3

## 2017-11-15 ENCOUNTER — Other Ambulatory Visit: Payer: Self-pay

## 2017-11-15 ENCOUNTER — Encounter: Payer: Self-pay | Admitting: Internal Medicine

## 2017-11-15 ENCOUNTER — Ambulatory Visit: Payer: BLUE CROSS/BLUE SHIELD | Admitting: Internal Medicine

## 2017-11-15 VITALS — BP 124/65 | HR 79 | Temp 97.7°F | Ht 62.0 in | Wt 133.0 lb

## 2017-11-15 DIAGNOSIS — E119 Type 2 diabetes mellitus without complications: Secondary | ICD-10-CM | POA: Diagnosis not present

## 2017-11-15 DIAGNOSIS — I10 Essential (primary) hypertension: Secondary | ICD-10-CM

## 2017-11-15 DIAGNOSIS — K219 Gastro-esophageal reflux disease without esophagitis: Secondary | ICD-10-CM

## 2017-11-15 LAB — POCT GLYCOSYLATED HEMOGLOBIN (HGB A1C): Hemoglobin A1C: 8.7 % — AB (ref 4.0–5.6)

## 2017-11-15 LAB — GLUCOSE, CAPILLARY: GLUCOSE-CAPILLARY: 214 mg/dL — AB (ref 70–99)

## 2017-11-15 MED ORDER — EMPAGLIFLOZIN-METFORMIN HCL 5-1000 MG PO TABS: 1.0000 | ORAL_TABLET | Freq: Two times a day (BID) | ORAL | 1 refills | Status: DC

## 2017-11-15 NOTE — Patient Instructions (Addendum)
For your diabetes: Start empagliflozin-metformin one pill once twice a day with meals Continue glipizide 10mg  daily Come back in 1 month

## 2017-11-16 LAB — BMP8+ANION GAP
Anion Gap: 17 mmol/L (ref 10.0–18.0)
BUN/Creatinine Ratio: 24 — ABNORMAL HIGH (ref 9–23)
BUN: 18 mg/dL (ref 6–24)
CO2: 25 mmol/L (ref 20–29)
Calcium: 10.7 mg/dL — ABNORMAL HIGH (ref 8.7–10.2)
Chloride: 96 mmol/L (ref 96–106)
Creatinine, Ser: 0.76 mg/dL (ref 0.57–1.00)
GFR calc Af Amer: 104 mL/min/1.73
GFR calc non Af Amer: 90 mL/min/1.73
Glucose: 222 mg/dL — ABNORMAL HIGH (ref 65–99)
Potassium: 4.9 mmol/L (ref 3.5–5.2)
Sodium: 138 mmol/L (ref 134–144)

## 2017-11-17 ENCOUNTER — Encounter: Payer: Self-pay | Admitting: Internal Medicine

## 2017-11-17 MED ORDER — SIMVASTATIN 80 MG PO TABS: ORAL_TABLET | ORAL | 3 refills | Status: DC

## 2017-11-17 NOTE — Assessment & Plan Note (Signed)
Patient endorses uncontrolled GERD on Zantac. She was referred to GI for EGD and seen in February at which time she again declined PPI's due to ineffective control of symptoms in the past and decided to post-pone her EGD until her insurance changes at the beginning of the year. She denies abd pain, hematochezia or melena. Prior to her vacation, her diet changes had made the GERD symptoms much better, however now they are worse again.   Plan: --continue zantac --discussed starting PPI again, however patient declined --encouraged patient to pursue EGD as soon as she can due to years worth of uncontrolled GERD symptoms --encouraged diet changes which seemed to at least improve her symptoms

## 2017-11-17 NOTE — Assessment & Plan Note (Addendum)
Uncontrolled, worsened with A1c 8.7 today.   Based on discussion with patient today it is uncertain that she is actually taking any insulin. She stopped Januvia due to making her feel "worse". She is taking glipizide and metformin, it seems regularly.  Plan: --change metformin > empagliflozin-metformin 5-1000mg  BID (cheaper option for patient) --continue glipizide 10mg  daily --stop basaglar --due to her GI symptoms currently, will likely not pursue starting GLP1 agonists --f/u in 1 month, if tolerating will increase to empagliflozin-metformin 12.5-1000mg  BID; she is to return earlier if symptoms of hyperglycemia worsen

## 2017-11-17 NOTE — Assessment & Plan Note (Signed)
Well controlled and asymptomatic.  Plan: --continue quinapril 40mg  daily and HCTZ 12.5mg  daily --f/u Bmet - normal renal function

## 2017-11-20 NOTE — Progress Notes (Signed)
Internal Medicine Clinic Attending  Case discussed with Dr. Svalina  at the time of the visit.  We reviewed the resident's history and exam and pertinent patient test results.  I agree with the assessment, diagnosis, and plan of care documented in the resident's note.  

## 2017-12-01 ENCOUNTER — Other Ambulatory Visit: Payer: Self-pay | Admitting: Internal Medicine

## 2017-12-01 DIAGNOSIS — G2581 Restless legs syndrome: Secondary | ICD-10-CM

## 2017-12-01 DIAGNOSIS — R232 Flushing: Secondary | ICD-10-CM

## 2017-12-01 NOTE — Telephone Encounter (Signed)
Next appt scheduled 9/4 with PCP.

## 2017-12-08 DIAGNOSIS — H2513 Age-related nuclear cataract, bilateral: Secondary | ICD-10-CM | POA: Diagnosis not present

## 2017-12-08 DIAGNOSIS — H3582 Retinal ischemia: Secondary | ICD-10-CM | POA: Diagnosis not present

## 2017-12-08 DIAGNOSIS — E113513 Type 2 diabetes mellitus with proliferative diabetic retinopathy with macular edema, bilateral: Secondary | ICD-10-CM | POA: Diagnosis not present

## 2017-12-08 DIAGNOSIS — H35033 Hypertensive retinopathy, bilateral: Secondary | ICD-10-CM | POA: Diagnosis not present

## 2018-01-09 ENCOUNTER — Telehealth: Payer: Self-pay | Admitting: *Deleted

## 2018-01-09 DIAGNOSIS — E119 Type 2 diabetes mellitus without complications: Secondary | ICD-10-CM

## 2018-01-09 MED ORDER — EMPAGLIFLOZIN-METFORMIN HCL ER 12.5-1000 MG PO TB24: 1.0000 | ORAL_TABLET | Freq: Two times a day (BID) | ORAL | 2 refills | Status: DC

## 2018-01-09 NOTE — Telephone Encounter (Signed)
appt made for 9/25 at 1045

## 2018-01-09 NOTE — Telephone Encounter (Signed)
Pt calls and states she will have to work tomorrow for 12 hrs. She would like to increase her dose of the empagliflozin- metformin. States her cbg has been from" 100 and something to the 200's and it's usually higher in the evening".  Please advise

## 2018-01-09 NOTE — Telephone Encounter (Signed)
Patient called stating she is unable to make it to appt tomorrow afternoon due to work. She wonders if the empagliflozin-metformin is not strong enough for her diabetes.  At last visit, her long acting insulin was stopped, glipizide was continued and she was changed from metformin alone, to empagliflozin-metformin 5-1000mg  BID. She reports her CBGs ranging from 150-250s. She denies UTIs or yeast infections since starting the new medicine.   Will increase to empagliflozin-metformin 12.5-1000mg  BID and continue the glipizide. Will have patient scheduled in [facility] since I don't have continuity clinic slots available to further review her glucose readings. She is in agreement with this plan.  This was discussed with Dr. Dareen Piano who agrees with above plan.  Alphonzo Grieve, MD IMTS - PGY3

## 2018-01-10 ENCOUNTER — Encounter: Payer: BLUE CROSS/BLUE SHIELD | Admitting: Internal Medicine

## 2018-01-11 ENCOUNTER — Other Ambulatory Visit: Payer: Self-pay | Admitting: Internal Medicine

## 2018-01-11 DIAGNOSIS — E119 Type 2 diabetes mellitus without complications: Secondary | ICD-10-CM

## 2018-01-31 ENCOUNTER — Ambulatory Visit: Payer: BLUE CROSS/BLUE SHIELD

## 2018-02-07 ENCOUNTER — Ambulatory Visit: Payer: BLUE CROSS/BLUE SHIELD | Admitting: Internal Medicine

## 2018-02-07 ENCOUNTER — Encounter: Payer: Self-pay | Admitting: Internal Medicine

## 2018-02-07 ENCOUNTER — Other Ambulatory Visit: Payer: Self-pay

## 2018-02-07 VITALS — BP 108/63 | HR 95 | Temp 98.0°F | Ht 62.0 in | Wt 121.4 lb

## 2018-02-07 DIAGNOSIS — Z23 Encounter for immunization: Secondary | ICD-10-CM

## 2018-02-07 DIAGNOSIS — Z6822 Body mass index (BMI) 22.0-22.9, adult: Secondary | ICD-10-CM

## 2018-02-07 DIAGNOSIS — Z794 Long term (current) use of insulin: Secondary | ICD-10-CM

## 2018-02-07 DIAGNOSIS — R630 Anorexia: Secondary | ICD-10-CM | POA: Diagnosis not present

## 2018-02-07 DIAGNOSIS — I10 Essential (primary) hypertension: Secondary | ICD-10-CM | POA: Diagnosis not present

## 2018-02-07 DIAGNOSIS — E1169 Type 2 diabetes mellitus with other specified complication: Secondary | ICD-10-CM | POA: Diagnosis not present

## 2018-02-07 DIAGNOSIS — R634 Abnormal weight loss: Secondary | ICD-10-CM

## 2018-02-07 DIAGNOSIS — Z9111 Patient's noncompliance with dietary regimen: Secondary | ICD-10-CM

## 2018-02-07 DIAGNOSIS — E119 Type 2 diabetes mellitus without complications: Secondary | ICD-10-CM

## 2018-02-07 DIAGNOSIS — Z79899 Other long term (current) drug therapy: Secondary | ICD-10-CM

## 2018-02-07 LAB — POCT GLYCOSYLATED HEMOGLOBIN (HGB A1C): HEMOGLOBIN A1C: 10 % — AB (ref 4.0–5.6)

## 2018-02-07 LAB — GLUCOSE, CAPILLARY: GLUCOSE-CAPILLARY: 122 mg/dL — AB (ref 70–99)

## 2018-02-07 MED ORDER — BASAGLAR KWIKPEN 100 UNIT/ML ~~LOC~~ SOPN: 20.0000 [IU] | PEN_INJECTOR | Freq: Every day | SUBCUTANEOUS | 6 refills | Status: DC

## 2018-02-07 NOTE — Assessment & Plan Note (Signed)
Patient is here for diabetes follow-up.  She is currently taking metformin-empagliflozin 12.09-998 mg twice daily and glipizide 10 mg daily.  Since her last visit, patient has been caring for her sick father who recently passed away last week.  She admits noncompliance with her diet.  She has not been regularly checking her blood sugar at home.  Does not have a glucometer with her today.  Reports when she does check her sugar it is often elevated above 300.  She endorses symptoms of poor appetite and weight loss.  On chart review she has lost 12 pounds in 3 months.  Her A1c today has increased from 8.7 to 10.0.  In the setting of her poor appetite and cachexia, we discussed the need to restart her insulin.  I have encouraged compliance with a low sugar low-carb diet.  We will restart her insulin glargine at a reduced dose (previously using 30 units nightly). --Restart Lantus 20 units nightly --Continue glipizide 10 mg daily -Continue empagliflozin-metformin 12.09-998 mg BID --Discussed dietary lifestyle changes --Follow-up 3 months with PCP

## 2018-02-07 NOTE — Patient Instructions (Signed)
Ms. Mcneff,  It was a pleasure to meet you. I have started you back on your insulin. Please inject 20 units at night before bed. Continue to take your metformin-empagliflozin and glipizide as previously prescribed. Follow up with your primary care doctor in 3 months. If you have any questions or concerns, call our clinic at [phone] or after hours call [phone] and ask for the internal medicine resident on call. Thank you!  Dr. Philipp Ovens

## 2018-02-07 NOTE — Progress Notes (Signed)
   CC: Diabetes follow up  HPI:  [name] is a 52 y.o. female with past medical history outlined below here for diabetes follow up. For the details of today's visit, please refer to the assessment and plan.  Past Medical History:  Diagnosis Date  . GERD (gastroesophageal reflux disease)   . Hyperlipidemia   . Hypertension   . Thrombocytosis ([place]) 02/08/2017  . Type 2 diabetes mellitus (HCC)    insulin dependent    Review of Systems  Constitutional: Positive for weight loss.  Gastrointestinal:       Poor appetite     Physical Exam:  Vitals:   02/07/18 1533  BP: 108/63  Pulse: 95  Temp: 98 F (36.7 C)  TempSrc: Oral  SpO2: 99%  Weight: 121 lb 6.4 oz (55.1 kg)  Height: 5\' 2"  (1.575 m)    Constitutional: NAD, appears comfortable Cardiovascular: RRR, no murmurs, rubs, or gallops.  Pulmonary/Chest: CTAB, no wheezes, rales, or rhonchi.  Extremities: Warm and well perfused. No edema.  Psychiatric: Normal mood and affect  Assessment & Plan:   See Encounters Tab for problem based charting.  Patient discussed with Dr. Evette Doffing

## 2018-02-07 NOTE — Assessment & Plan Note (Signed)
Well-controlled today, 108/63.  Continue current regimen.

## 2018-02-08 NOTE — Progress Notes (Signed)
Internal Medicine Clinic Attending  Case discussed with Dr. Guilloud at the time of the visit.  We reviewed the resident's history and exam and pertinent patient test results.  I agree with the assessment, diagnosis, and plan of care documented in the resident's note.  

## 2018-02-10 ENCOUNTER — Other Ambulatory Visit: Payer: Self-pay | Admitting: Gastroenterology

## 2018-02-14 ENCOUNTER — Ambulatory Visit: Payer: BLUE CROSS/BLUE SHIELD | Admitting: Internal Medicine

## 2018-02-23 ENCOUNTER — Other Ambulatory Visit: Payer: Self-pay | Admitting: *Deleted

## 2018-02-23 DIAGNOSIS — E119 Type 2 diabetes mellitus without complications: Secondary | ICD-10-CM

## 2018-02-25 MED ORDER — "PEN NEEDLES 3/16"" 31G X 5 MM MISC": 6 refills | Status: DC

## 2018-03-01 ENCOUNTER — Other Ambulatory Visit: Payer: Self-pay | Admitting: Internal Medicine

## 2018-03-01 DIAGNOSIS — I10 Essential (primary) hypertension: Secondary | ICD-10-CM

## 2018-03-01 DIAGNOSIS — G2581 Restless legs syndrome: Secondary | ICD-10-CM

## 2018-03-01 DIAGNOSIS — R232 Flushing: Secondary | ICD-10-CM

## 2018-03-02 ENCOUNTER — Other Ambulatory Visit: Payer: Self-pay | Admitting: Internal Medicine

## 2018-03-02 DIAGNOSIS — E119 Type 2 diabetes mellitus without complications: Secondary | ICD-10-CM

## 2018-03-08 ENCOUNTER — Other Ambulatory Visit: Payer: Self-pay | Admitting: Internal Medicine

## 2018-03-14 ENCOUNTER — Encounter: Payer: BLUE CROSS/BLUE SHIELD | Admitting: Internal Medicine

## 2018-04-03 ENCOUNTER — Other Ambulatory Visit: Payer: Self-pay | Admitting: Internal Medicine

## 2018-04-03 DIAGNOSIS — G2581 Restless legs syndrome: Secondary | ICD-10-CM

## 2018-04-09 ENCOUNTER — Other Ambulatory Visit: Payer: Self-pay | Admitting: Internal Medicine

## 2018-04-09 DIAGNOSIS — E119 Type 2 diabetes mellitus without complications: Secondary | ICD-10-CM

## 2018-04-16 ENCOUNTER — Other Ambulatory Visit: Payer: Self-pay | Admitting: Gastroenterology

## 2018-04-23 ENCOUNTER — Encounter: Payer: Self-pay | Admitting: Internal Medicine

## 2018-04-23 ENCOUNTER — Other Ambulatory Visit: Payer: Self-pay

## 2018-04-23 ENCOUNTER — Ambulatory Visit: Payer: BLUE CROSS/BLUE SHIELD | Admitting: Internal Medicine

## 2018-04-23 VITALS — BP 129/65 | HR 86 | Temp 98.4°F | Ht 62.0 in | Wt 129.1 lb

## 2018-04-23 DIAGNOSIS — M25512 Pain in left shoulder: Secondary | ICD-10-CM

## 2018-04-23 MED ORDER — NAPROXEN 500 MG PO TABS: 500.0000 mg | ORAL_TABLET | Freq: Two times a day (BID) | ORAL | 1 refills | Status: AC

## 2018-04-23 MED ORDER — METHOCARBAMOL 500 MG PO TABS: 500.0000 mg | ORAL_TABLET | Freq: Two times a day (BID) | ORAL | 0 refills | Status: AC | PRN

## 2018-04-23 NOTE — Progress Notes (Signed)
   CC: L Shoulder Pain  HPI:  Brooke Floyd is a 52 y.o. F with PMHx listed below presenting for L shoulder pain. Please see the A&P for the status of the patient's chronic medical problems.  Past Medical History:  Diagnosis Date  . GERD (gastroesophageal reflux disease)   . Hyperlipidemia   . Hypertension   . Thrombocytosis ([place]) 02/08/2017  . Type 2 diabetes mellitus (HCC)    insulin dependent   Review of Systems:  Performed and all others negative.  Physical Exam:   Vitals:   04/23/18 0939  BP: 129/65  Pulse: 86  Temp: 98.4 F (36.9 C)  TempSrc: Oral  SpO2: 99%  Weight: 129 lb 1.6 oz (58.6 kg)  Height: 5\' 2"  (1.575 m)   Physical Exam Constitutional:      Appearance: Normal appearance. She is not ill-appearing.  Cardiovascular:     Rate and Rhythm: Normal rate and regular rhythm.     Pulses: Normal pulses.     Heart sounds: Normal heart sounds.  Pulmonary:     Effort: Pulmonary effort is normal. No respiratory distress.     Breath sounds: Normal breath sounds.  Abdominal:     General: Bowel sounds are normal. There is no distension.     Palpations: Abdomen is soft.     Tenderness: There is no abdominal tenderness.  Musculoskeletal:        General: No swelling or deformity.     Comments: Tight musculature of left neck with mild tenderness Negative Spurling on L Tenderness to palpation of anterior shoulder No edema or erythema of Bilateral shoulders Positive Empty Can test on Left ROM restricted due to pain  On L with ~110 degrees of extension and Abduction. L also restricted in internal rotation.  Skin:    General: Skin is warm and dry.    Assessment & Plan:   See Encounters Tab for problem based charting.  Patient discussed with Dr. Angelia Mould

## 2018-04-23 NOTE — Assessment & Plan Note (Signed)
Patient reports 3 months of Left shoulder pain. Pain is described as a 09/15/08 Achy/Sharp pain in her left shoulder with some radiation into her arm and associated pain in her lateral neck. 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 The pain is exacerbated by activity and limits her ROM. She has tried tylenol, tiger balm, icey-hot, and Cold compresses; which provide temporary relief. Her significant other is a massage therapist and he has attempted to massage the area which exacerbates the pain.  On Exam, Tight musculature of left neck with mild tenderness. Negative Spurling on L. Tenderness to palpation of anterior shoulder. No edema or erythema of Bilateral shoulders. Positive Empty Can test on Left (For both pain and weakness). ROM restricted due to pain  On L with ~110 degrees of extension and Abduction. L also restricted in internal rotation.  Patient's presentation consistent with rotator cuff injury. Will treat conservatively for now with NSAIDs and PT referral. Patient advised to stop with massage therapy and limit other activity that causes pain; but to continue to use the limb in ways that do not cause pain. Muscle tension in neck is Likey spasm related compensatory muscle use, will provide short course of muscle relaxant. - Naproxen 500mg  BID  - PT referral - Robaxin 500mg  BID PRN x 14d - Reevaluation at PCP visit on 1/8

## 2018-04-23 NOTE — Patient Instructions (Addendum)
Thank you for allowing [place] to care for you  For your shoulder pain - This pain is consistent with a rotator cuff injury - We have prescribed NSAIDs (Naproxen) and placed a referral to PT - We have also provided a muscle relaxer for your neck tension - Please follow up with your PCP as scheduled

## 2018-04-25 ENCOUNTER — Encounter: Payer: Self-pay | Admitting: *Deleted

## 2018-04-25 NOTE — Progress Notes (Signed)
Internal Medicine Clinic Attending  Case discussed with Dr. Melvin  at the time of the visit.  We reviewed the resident's history and exam and pertinent patient test results.  I agree with the assessment, diagnosis, and plan of care documented in the resident's note.  

## 2018-04-27 ENCOUNTER — Other Ambulatory Visit: Payer: Self-pay | Admitting: *Deleted

## 2018-04-27 ENCOUNTER — Other Ambulatory Visit: Payer: Self-pay | Admitting: Gastroenterology

## 2018-04-27 MED ORDER — FAMOTIDINE 40 MG PO TABS: 40.0000 mg | ORAL_TABLET | Freq: Every day | ORAL | 3 refills | Status: DC

## 2018-05-01 ENCOUNTER — Encounter: Payer: Self-pay | Admitting: Physical Therapy

## 2018-05-01 ENCOUNTER — Ambulatory Visit: Payer: BLUE CROSS/BLUE SHIELD | Attending: Internal Medicine | Admitting: Physical Therapy

## 2018-05-01 DIAGNOSIS — M25512 Pain in left shoulder: Secondary | ICD-10-CM

## 2018-05-01 DIAGNOSIS — M25612 Stiffness of left shoulder, not elsewhere classified: Secondary | ICD-10-CM | POA: Diagnosis not present

## 2018-05-01 DIAGNOSIS — M6281 Muscle weakness (generalized): Secondary | ICD-10-CM | POA: Diagnosis not present

## 2018-05-01 DIAGNOSIS — R293 Abnormal posture: Secondary | ICD-10-CM | POA: Diagnosis not present

## 2018-05-01 NOTE — Therapy (Signed)
[place], [place], [address] Phone: [phone]   Fax:  [phone]  Physical Therapy Evaluation  Patient Details  Name: Brooke Floyd MRN: [id number] Date of Birth: 11/18/1965 Referring Provider (PT): Lucious Groves, DO   Encounter Date: 05/01/2018  PT End of Session - 05/01/18 0928    Visit Number  1    Number of Visits  13    Date for PT Re-Evaluation  06/12/18    PT Start Time  0922    PT Stop Time  1015    PT Time Calculation (min)  53 min    Activity Tolerance  Patient limited by pain;Patient tolerated treatment well       Past Medical History:  Diagnosis Date  . GERD (gastroesophageal reflux disease)   . Hyperlipidemia   . Hypertension   . Thrombocytosis ([place]) 02/08/2017  . Type 2 diabetes mellitus (HCC)    insulin dependent    Past Surgical History:  Procedure Laterality Date  . COMBINED HYSTERECTOMY ABDOMINAL W/ A&P REPAIR / OOPHORECTOMY    . WRIST ARTHROSCOPY      There were no vitals filed for this visit.   Subjective Assessment - 05/01/18 0933    Subjective  pt is a 52 y.o F with CC of L shoulder pain starting after she tried to catch her patient from falling backward and felt a pop int he shoulder that started last week. she notes she has been having pain in the shoulder that a couple months ago with no speicific. she reports pain the side of the neck and sharp pain into the fingers of her L hand. since onset the pain continues to get worse.     Limitations  Lifting    How long can you sit comfortably?  unsure     How long can you stand comfortably?  unsure    How long can you walk comfortably?  unsure    Diagnostic tests  N/A    Patient Stated Goals  to get better, calm down the pain, return to normal activity.     Currently in Pain?  Yes    Pain Score  5    pt put biofreeze on this morning   Pain Orientation  Left    Pain Descriptors / Indicators  Sharp;Shooting    Pain Type  Acute pain    Pain Radiating Towards  to the L arm/ hand into the wirst and fingers    Pain Onset  1 to 4 weeks ago    Pain Frequency  Constant    Aggravating Factors   raising the arm up, using the L shoulder,     Pain Relieving Factors  biofreeze, other topical ointments, heat/ ice    Effect of Pain on Daily Activities  limited l shoulder activity          [facility] PT Assessment - 05/01/18 0927      Assessment   Medical Diagnosis  L shoulder pain     Referring Provider (PT)  Lucious Groves, DO    Onset Date/Surgical Date  --   1 week   Hand Dominance  Right    Next MD Visit  05/16/2017    Prior Therapy  yes      Precautions   Precautions  --    Precaution Comments  avoid lifting with the L shoulder      Restrictions   Weight Bearing Restrictions  No  Balance Screen   Has the patient fallen in the past 6 months  No    Has the patient had a decrease in activity level because of a fear of falling?   No    Is the patient reluctant to leave their home because of a fear of falling?   No      Home Film/video editor residence    Living Arrangements  Spouse/significant other    Available Help at Discharge  Family    Type of Man to enter    Entrance Stairs-Number of Steps  4    Entrance Stairs-Rails  Left   ascending   Home Layout  One level      Prior Function   Level of Independence  Independent    Vocation  Full time employment   CNA     Cognition   Overall Cognitive Status  Within Functional Limits for tasks assessed      Observation/Other Assessments   Focus on Therapeutic Outcomes (FOTO)   53% limited   predicted 34% limited     Posture/Postural Control   Posture/Postural Control  Postural limitations    Postural Limitations  Rounded Shoulders;Forward head      ROM / Strength   AROM / PROM / Strength  AROM;PROM;Strength      AROM   AROM Assessment Site  Shoulder    Right/Left Shoulder   Right;Left    Right Shoulder Extension  65 Degrees    Right Shoulder Flexion  150 Degrees    Right Shoulder ABduction  143 Degrees    Right Shoulder Internal Rotation  --   t6   Right Shoulder External Rotation  --   T4   Left Shoulder Extension  22 Degrees    Left Shoulder Flexion  100 Degrees    Left Shoulder ABduction  80 Degrees    Left Shoulder Internal Rotation  --   greater trochanter   Left Shoulder External Rotation  --   C3     PROM   PROM Assessment Site  Shoulder    Right/Left Shoulder  Left    Left Shoulder Flexion  30 Degrees    Left Shoulder ABduction  55 Degrees    Left Shoulder Internal Rotation  40 Degrees   to stomach   Left Shoulder External Rotation  --   45 degrees of abduction     Strength   Strength Assessment Site  Shoulder;Hand    Right/Left Shoulder  Right;Left    Right Shoulder Flexion  5/5    Right Shoulder Extension  5/5    Right Shoulder ABduction  5/5    Right Shoulder Internal Rotation  5/5    Right Shoulder External Rotation  5/5    Left Shoulder Flexion  3/5    Left Shoulder Extension  3/5    Left Shoulder ABduction  3/5    Left Shoulder Internal Rotation  4-/5   inavailable ROM   Left Shoulder External Rotation  4-/5   in available ROM   Right Hand Grip (lbs)  66   55,73,22   Left Hand Grip (lbs)  47.6   47,45,51     Palpation   Palpation comment  TTP along the upper trap, infraspinatus/ teres minor, and scalenes on the L      Special Tests    Special Tests  Rotator Cuff Impingement    Rotator Cuff Impingment tests  Neer impingement test;Hawkins- Kennedy test;Lift- off test      Neer Impingement test    Findings  Unable to tesr      Hawkins-Kennedy test   Findings  Unable to test      Lift-Off test   Findings  Unable to test                Objective measurements completed on examination: See above findings.      [facility] Adult PT Treatment/Exercise - 05/01/18 0927      Exercises   Exercises  Shoulder       Shoulder Exercises: Stretch   Other Shoulder Stretches  upper trap stretch 2 x 30 sec      Manual Therapy   Manual therapy comments  MTPR along the upper trap/ scalenes on the L             PT Education - 05/01/18 0931    Education Details  evaluation findings, POC, goals, HEp with proper form/ rationale.    Person(s) Educated  Patient    Methods  Explanation;Verbal cues;Handout    Comprehension  Verbalized understanding;Verbal cues required       PT Short Term Goals - 05/01/18 1047      PT SHORT TERM GOAL #1   Title  pt to be I with inital HEP    Time  3    Period  Weeks    Status  New    Target Date  05/22/18      PT SHORT TERM GOAL #2   Title  pt to verbalize and demo proper posture and lifting techniques to reduce compensation and prevent R shoulder pain     Time  3    Period  Weeks    Status  New    Target Date  05/22/18      PT SHORT TERM GOAL #3   Title  increase Grip strength in L hand by >/= 10# to demo improvement in function     Time  6    Period  Weeks    Status  New    Target Date  06/12/18        PT Long Term Goals - 05/01/18 1048      PT LONG TERM GOAL #1   Title  increase L shoulder flexion/ abduction to >/= 140 and IR to L5, and ER to >/= C7 with </= 2/10 pain for functional mobility for ADLs     Time  6    Period  Weeks    Status  New    Target Date  06/12/18      PT LONG TERM GOAL #2   Title  increase L shoulder strength to >/= 4+/5 in all planes to promote stability with lifting and carrying activities    Time  6    Period  Weeks    Status  New    Target Date  06/12/18      PT LONG TERM GOAL #3   Title  pt to be able to lift/lower >/= 15#, and push/pull  >/=20# for functional lifting required for work related tasks     Time  6    Period  Weeks    Status  New    Target Date  06/12/18      PT LONG TERM GOAL #4   Title   increase FOTO score to </= 34% limited to demo improvement in function     Time  6  Period  Weeks     Status  New    Target Date  06/12/18      PT LONG TERM GOAL #5   Title  pt to be I with all HEP given as of last visit to maintain and progress current level of function    Time  6    Period  Weeks    Status  New    Target Date  06/12/18             Plan - 05/01/18 1042    Clinical Impression Statement  pt present to OPPT with CC of L shoulder pain started about 1 week ago due to trying to catch a patient from falling and felt a pop. She demonstrates significant guarding with pain in the l shoulder limiting AROM/PROM. She demonstates weakness noted in the shoulder with pain during testing.  unable to properly speciall testing due to guarding and pain with movement. She would benefit from physical therapy to decrease L shoulder pain, improve mobility/ strength and return to PLOF by addressing the deficits listed.     History and Personal Factors relevant to plan of care:  hx of DM    Clinical Presentation  Evolving    Clinical Presentation due to:  hx or chronic R shoulder pain with recent exacerbation, limited ROM, significant guarding,     Clinical Decision Making  Moderate    Rehab Potential  Good    PT Frequency  2x / week    PT Duration  6 weeks    PT Treatment/Interventions  ADLs/Self Care Home Management;Cryotherapy;Electrical Stimulation;Iontophoresis 4mg /ml Dexamethasone;Moist Heat;Ultrasound;Therapeutic exercise;Therapeutic activities;Taping;Dry needling;Patient/family education;Manual techniques;Vasopneumatic Device    PT Next Visit Plan  review/ update HEP, shoulder mobs/ ROM, scapular setting, modalities for pain PRN    PT Home Exercise Plan  upper trap stretch, table slides flexion/ abduction, scapular retraction    Consulted and Agree with Plan of Care  Patient       Patient will benefit from skilled therapeutic intervention in order to improve the following deficits and impairments:  Pain, Impaired UE functional use, Increased fascial restricitons, Decreased strength,  Decreased endurance, Decreased activity tolerance, Decreased range of motion, Postural dysfunction, Improper body mechanics, Increased muscle spasms, Increased edema  Visit Diagnosis: Acute pain of left shoulder  Muscle weakness (generalized)  Stiffness of left shoulder, not elsewhere classified  Abnormal posture     Problem List Patient Active Problem List   Diagnosis Date Noted  . Acute pain of left shoulder 04/23/2018  . Thrombocytosis ([place]) 02/08/2017  . Abnormal vaginal bleeding 01/11/2017  . Hot flashes 02/19/2016  . Depression 09/29/2015  . Restless leg syndrome 06/29/2015  . Murmur 08/29/2013  . Preventative health care 01/02/2013  . HTN (hypertension) 06/14/2012  . Hyperlipidemia 06/14/2012  . Asthma, mild intermittent, well-controlled 02/17/2009  . GERD 02/17/2009  . Diabetes mellitus without complication ([place]) 25/85/2778   Starr Lake PT, DPT, LAT, ATC  05/01/18  10:54 AM      [facility] [address] [place], [place], [address] Phone: [phone]   Fax:  [phone]  Name: Brooke Floyd MRN: [id number] Date of Birth: [birth year]

## 2018-05-15 ENCOUNTER — Encounter: Payer: Self-pay | Admitting: Physical Therapy

## 2018-05-15 ENCOUNTER — Ambulatory Visit: Payer: BLUE CROSS/BLUE SHIELD | Attending: Internal Medicine | Admitting: Physical Therapy

## 2018-05-15 DIAGNOSIS — M6281 Muscle weakness (generalized): Secondary | ICD-10-CM | POA: Diagnosis not present

## 2018-05-15 DIAGNOSIS — R293 Abnormal posture: Secondary | ICD-10-CM | POA: Diagnosis not present

## 2018-05-15 DIAGNOSIS — M25512 Pain in left shoulder: Secondary | ICD-10-CM | POA: Diagnosis not present

## 2018-05-15 DIAGNOSIS — M25612 Stiffness of left shoulder, not elsewhere classified: Secondary | ICD-10-CM

## 2018-05-15 NOTE — Therapy (Signed)
[place], [place], [address] Phone: [phone]   Fax:  [phone]  Physical Therapy Treatment  Patient Details  Name: Brooke Floyd MRN: [id number] Date of Birth: [birth year] Referring Provider (PT): Lucious Groves, DO   Encounter Date: 05/15/2018  PT End of Session - 05/15/18 1135    Visit Number  2    Number of Visits  13    Date for PT Re-Evaluation  06/12/18    PT Start Time  3546    PT Stop Time  1230    PT Time Calculation (min)  55 min    Activity Tolerance  Patient tolerated treatment well    Behavior During Therapy  [facility] for tasks assessed/performed       Past Medical History:  Diagnosis Date  . GERD (gastroesophageal reflux disease)   . Hyperlipidemia   . Hypertension   . Thrombocytosis ([place]) 02/08/2017  . Type 2 diabetes mellitus (HCC)    insulin dependent    Past Surgical History:  Procedure Laterality Date  . COMBINED HYSTERECTOMY ABDOMINAL W/ A&P REPAIR / OOPHORECTOMY    . WRIST ARTHROSCOPY      There were no vitals filed for this visit.  Subjective Assessment - 05/15/18 1136    Subjective  "I am having alot pain in the shoulder, I had a a near fall the other day and I caught my self with RUE with increased pain. I have been having trouble sleeping as well"     Currently in Pain?  Yes    Pain Score  8     Pain Location  Shoulder    Pain Orientation  Left    Pain Descriptors / Indicators  Shooting    Pain Type  Chronic pain    Pain Onset  More than a month ago    Pain Frequency  Intermittent    Aggravating Factors   raising the arm up,    Pain Relieving Factors  biofreeze,                        [facility] Adult PT Treatment/Exercise - 05/15/18 0001      Shoulder Exercises: Seated   Other Seated Exercises  table slides flexion/ abduction 2 x 10      Modalities   Modalities  Electrical Stimulation;Moist Heat      Moist Heat Therapy   Number Minutes  Moist Heat  10 Minutes    Moist Heat Location  Shoulder   pt requested ice pack on her head for her headache as well     Electrical Stimulation   Electrical Stimulation Location  L shoulder    Electrical Stimulation Action  IFC    Electrical Stimulation Parameters  L 6, 100% scan x 15 min    Electrical Stimulation Goals  Pain      Manual Therapy   Manual Therapy  Joint mobilization;Soft tissue mobilization;Passive ROM    Manual therapy comments  MTPR along the upper trap/ infraspinatus    Joint Mobilization  PA/ AP and inferior mobs grade I-II    Passive ROM  abduction/ flexion in supine working into end ranges woth grade I distraction oscillations for pain             PT Education - 05/15/18 1221    Education Details  began pain education regarding pain is necessary aspect and doesn't inidicate damage    Person(s) Educated  Patient  Methods  Explanation;Verbal cues    Comprehension  Verbalized understanding;Verbal cues required       PT Short Term Goals - 05/01/18 1047      PT SHORT TERM GOAL #1   Title  pt to be I with inital HEP    Time  3    Period  Weeks    Status  New    Target Date  05/22/18      PT SHORT TERM GOAL #2   Title  pt to verbalize and demo proper posture and lifting techniques to reduce compensation and prevent R shoulder pain     Time  3    Period  Weeks    Status  New    Target Date  05/22/18      PT SHORT TERM GOAL #3   Title  increase Grip strength in L hand by >/= 10# to demo improvement in function     Time  6    Period  Weeks    Status  New    Target Date  06/12/18        PT Long Term Goals - 05/01/18 1048      PT LONG TERM GOAL #1   Title  increase L shoulder flexion/ abduction to >/= 140 and IR to L5, and ER to >/= C7 with </= 2/10 pain for functional mobility for ADLs     Time  6    Period  Weeks    Status  New    Target Date  06/12/18      PT LONG TERM GOAL #2   Title  increase L shoulder strength to >/= 4+/5 in all  planes to promote stability with lifting and carrying activities    Time  6    Period  Weeks    Status  New    Target Date  06/12/18      PT LONG TERM GOAL #3   Title  pt to be able to lift/lower >/= 15#, and push/pull  >/=20# for functional lifting required for work related tasks     Time  6    Period  Weeks    Status  New    Target Date  06/12/18      PT LONG TERM GOAL #4   Title   increase FOTO score to </= 34% limited to demo improvement in function     Time  6    Period  Weeks    Status  New    Target Date  06/12/18      PT LONG TERM GOAL #5   Title  pt to be I with all HEP given as of last visit to maintain and progress current level of function    Time  6    Period  Weeks    Status  New    Target Date  06/12/18            Plan - 05/15/18 1220    Clinical Impression Statement  pt reports consistency with her HEP but has modified her table slides to the floor using a wheel device (sounded like an ab roller) on the floor. Continued working on shoulder ROM in supine but pt demonstrates significant guarding and is pain focused. worked on beginning pain education, which pt was receptive to. utilized MHP and e-stim to calm down pain.     PT Treatment/Interventions  ADLs/Self Care Home Management;Cryotherapy;Electrical Stimulation;Iontophoresis 4mg /ml Dexamethasone;Moist Heat;Ultrasound;Therapeutic exercise;Therapeutic activities;Taping;Dry needling;Patient/family education;Manual techniques;Vasopneumatic Device  PT Next Visit Plan  review/ update HEP, shoulder mobs/ ROM, scapular setting, modalities for pain PRN    PT Home Exercise Plan  upper trap stretch, table slides flexion/ abduction, scapular retraction    Consulted and Agree with Plan of Care  Patient       Patient will benefit from skilled therapeutic intervention in order to improve the following deficits and impairments:  Pain, Impaired UE functional use, Increased fascial restricitons, Decreased strength,  Decreased endurance, Decreased activity tolerance, Decreased range of motion, Postural dysfunction, Improper body mechanics, Increased muscle spasms, Increased edema  Visit Diagnosis: Acute pain of left shoulder  Muscle weakness (generalized)  Stiffness of left shoulder, not elsewhere classified  Abnormal posture     Problem List Patient Active Problem List   Diagnosis Date Noted  . Acute pain of left shoulder 04/23/2018  . Thrombocytosis ([place]) 02/08/2017  . Abnormal vaginal bleeding 01/11/2017  . Hot flashes 02/19/2016  . Depression 09/29/2015  . Restless leg syndrome 06/29/2015  . Murmur 08/29/2013  . Preventative health care 01/02/2013  . HTN (hypertension) 06/14/2012  . Hyperlipidemia 06/14/2012  . Asthma, mild intermittent, well-controlled 02/17/2009  . GERD 02/17/2009  . Diabetes mellitus without complication ([place]) 37/29/0211   Starr Lake PT, DPT, LAT, ATC  05/15/18  12:29 PM      [facility] [address] [place], [place], [address] Phone: [phone]   Fax:  [phone]  Name: Brooke Floyd MRN: [id number] Date of Birth: 1965/10/03

## 2018-05-15 NOTE — Progress Notes (Signed)
   CC: urinary incontinence  HPI:  [name] is a 53 y.o. with a PMH of T2DM, HTN, thrombocytosis, GERD presenting to clinic for urinary incontinence.  Patient endorses several years h/o urinary leakage with coughing, sneezing or laughing since having her kids, that did previously improve with pelvic floor exercises however she has stopped these for some time. Since then her symptoms has returned but overall been stable until about 1wk ago when they worsened. She has had urinary incontinence again related to stress but also urge incontinence where she can't make it to the bathroom in time. She denies dysuria, hematuria, vaginal discharge but has had genital pruritus and thinks she may have a yeast infection.   Please see problem based Assessment and Plan for status of patients chronic conditions.  Past Medical History:  Diagnosis Date  . GERD (gastroesophageal reflux disease)   . Hyperlipidemia   . Hypertension   . Thrombocytosis ([place]) 02/08/2017  . Type 2 diabetes mellitus (HCC)    insulin dependent    Review of Systems:   Per HPI in addition to below. Denies chest pain, shortness of breath, changes in vision or hearing. Has tension headaches.  Physical Exam:  Vitals:   05/16/18 1422  BP: 128/60  Pulse: (!) 101  Temp: 97.9 F (36.6 C)  TempSrc: Oral  SpO2: 98%  Weight: 133 lb 1.6 oz (60.4 kg)  Height: 5\' 2"  (1.575 m)   GENERAL- alert, co-operative, appears as stated age, not in any distress. CARDIAC- RRR, systolic murmur, no rubs or gallops. RESP- Moving equal volumes of air, and clear to auscultation bilaterally, no wheezes or crackles. ABDOMEN- +BS NEURO- CN 2-12 grossly intact. EXTREMITIES- pulse 2+ PT/DP, symmetric, no pedal edema. SKIN- Warm, dry. PSYCH- Normal mood and affect, appropriate thought content and speech.  Assessment & Plan:   See Encounters Tab for problem based charting.   Patient discussed with Dr. Nilsa Nutting,  MD Internal Medicine PGY-3

## 2018-05-16 ENCOUNTER — Encounter: Payer: Self-pay | Admitting: Internal Medicine

## 2018-05-16 ENCOUNTER — Other Ambulatory Visit: Payer: Self-pay

## 2018-05-16 ENCOUNTER — Ambulatory Visit: Payer: BLUE CROSS/BLUE SHIELD | Admitting: Internal Medicine

## 2018-05-16 VITALS — BP 128/60 | HR 101 | Temp 97.9°F | Ht 62.0 in | Wt 133.1 lb

## 2018-05-16 DIAGNOSIS — E119 Type 2 diabetes mellitus without complications: Secondary | ICD-10-CM

## 2018-05-16 DIAGNOSIS — D473 Essential (hemorrhagic) thrombocythemia: Secondary | ICD-10-CM

## 2018-05-16 DIAGNOSIS — K219 Gastro-esophageal reflux disease without esophagitis: Secondary | ICD-10-CM

## 2018-05-16 DIAGNOSIS — N898 Other specified noninflammatory disorders of vagina: Secondary | ICD-10-CM | POA: Diagnosis not present

## 2018-05-16 DIAGNOSIS — D75839 Thrombocytosis, unspecified: Secondary | ICD-10-CM

## 2018-05-16 DIAGNOSIS — G44209 Tension-type headache, unspecified, not intractable: Secondary | ICD-10-CM

## 2018-05-16 DIAGNOSIS — M25512 Pain in left shoulder: Secondary | ICD-10-CM

## 2018-05-16 DIAGNOSIS — Z79899 Other long term (current) drug therapy: Secondary | ICD-10-CM

## 2018-05-16 DIAGNOSIS — N3946 Mixed incontinence: Secondary | ICD-10-CM | POA: Diagnosis not present

## 2018-05-16 DIAGNOSIS — I10 Essential (primary) hypertension: Secondary | ICD-10-CM

## 2018-05-16 DIAGNOSIS — Z794 Long term (current) use of insulin: Secondary | ICD-10-CM

## 2018-05-16 DIAGNOSIS — R011 Cardiac murmur, unspecified: Secondary | ICD-10-CM

## 2018-05-16 LAB — POCT URINALYSIS DIPSTICK
Bilirubin, UA: NEGATIVE
Glucose, UA: POSITIVE — AB
KETONES UA: NEGATIVE
NITRITE UA: NEGATIVE
PH UA: 6 (ref 5.0–8.0)
Protein, UA: NEGATIVE
Spec Grav, UA: <=1.005 — AB (ref 1.010–1.025)
UROBILINOGEN UA: 0.2 U/dL

## 2018-05-16 NOTE — Assessment & Plan Note (Signed)
Well controlled, asymptomatic.  Plan: --continue HCTZ and quinapril

## 2018-05-16 NOTE — Assessment & Plan Note (Addendum)
Patient with acutely worsening symptoms in the last week; endorses genital pruritus and thinks she may have a yeast infection; denies symptoms of UTI.  Dipstick UA with trace intact blood, trace leuks - not convincing for UTI in setting of overall lack of symptoms.  Likely worsened with poor glycemic control and pelvic floor laxity.  Plan: --patient wants to continue trying OTC monistat for yeast infection; advised to call clinic if not sufficient --f/u complete UA due to trace blood noted --advised on timed voiding, avoiding foods/drinks that may exacerbate symptoms, pelvic floor exercises.  --patient already receiving PT for shoulder, she will ask to address pelvic floor laxity as well   Addendum: Complete UA w/o leukocytes or microscopic hematuria

## 2018-05-16 NOTE — Assessment & Plan Note (Addendum)
Patient declined A1c and labs today. She endorses compliance with insulin glargine 20 units at night, glipizide 10mg  daily, synjardy 12.5-1000mg  BID. Endorses increased urinary frequency.   Plan: --patient wants to come back in 2 months for DM follow up and defer labs and A1c until then --advised on decreased carb intake from food and drink sources (she continues to drink multiple lattes throughout the day) --foot exam performed today --patient has ophtho appt in Feb; asked to fax records after visit

## 2018-05-16 NOTE — Assessment & Plan Note (Signed)
Planned to continue workup for this today, however patient declined. I discussed the next steps of repeating CBC and obtaining CXR and that we should address this at f/u. She was in agreement.

## 2018-05-16 NOTE — Patient Instructions (Addendum)
Below is information about urinary incontinence and some pelvic floor exercises.  Some other things to try is to schedule your bathroom breaks even if you don't feel like you have to urinate. Try to avoid things like tea, coffee (any caffeine) that can increase your symptoms.  Take stool softeners like Miralax to ensure soft stool and prevent constipation.    Urinary Incontinence  Urinary incontinence refers to a condition in which a person is unable to control where and when to pass urine. A person with this condition will urinate when he or she does not mean to (involuntarily). What are the causes? This condition may be caused by:  Medicines.  Infections.  Constipation.  Overactive bladder muscles.  Weak bladder muscles.  Weak pelvic floor muscles. These muscles provide support for the bladder, intestine, and, in women, the uterus.  Enlarged prostate in men. The prostate is a gland near the bladder. When it gets too big, it can pinch the urethra. With the urethra blocked, the bladder can weaken and lose the ability to empty properly.  Surgery.  Emotional factors, such as anxiety, stress, or post-traumatic stress disorder (PTSD).  Pelvic organ prolapse. This happens in women when organs shift out of place and into the vagina. This shift can prevent the bladder and urethra from working properly. What increases the risk? The following factors may make you more likely to develop this condition:  Older age.  Obesity and physical inactivity.  Pregnancy and childbirth.  Menopause.  Diseases that affect the nerves or spinal cord (neurological diseases).  Long-term (chronic) coughing. This can increase pressure on the bladder and pelvic floor muscles. What are the signs or symptoms? Symptoms may vary depending on the type of urinary incontinence you have. They include:  A sudden urge to urinate, but passing urine involuntarily before you can get to a bathroom (urge  incontinence).  Suddenly passing urine with any activity that forces urine to pass, such as coughing, laughing, exercise, or sneezing (stress incontinence).  Needing to urinate often, but urinating only a small amount, or constantly dribbling urine (overflow incontinence).  Urinating because you cannot get to the bathroom in time due to a physical disability, such as arthritis or injury, or communication and thinking problems, such as Alzheimer disease (functional incontinence). How is this diagnosed? This condition may be diagnosed based on:  Your medical history.  A physical exam.  Tests, such as: ? Urine tests. ? X-rays of your kidney and bladder. ? Ultrasound. ? CT scan. ? Cystoscopy. In this procedure, a health care provider inserts a tube with a light and camera (cystoscope) through the urethra and into the bladder in order to check for problems. ? Urodynamic testing. These tests assess how well the bladder, urethra, and sphincter can store and release urine. There are different types of urodynamic tests, and they vary depending on what the test is measuring. To help diagnose your condition, your health care provider may recommend that you keep a log of when you urinate and how much you urinate. How is this treated? Treatment for this condition depends on the type of incontinence that you have and its cause. Treatment may include:  Lifestyle changes, such as: ? Quitting smoking. ? Maintaining a healthy weight. ? Staying active. Try to get 150 minutes of moderate-intensity exercise every week. Ask your health care provider which activities are safe for you. ? Eating a healthy diet.  Avoid high-fat foods, like fried foods.  Avoid refined carbohydrates like white bread and  white rice.  Limit how much alcohol and caffeine you drink.  Increase your fiber intake. Foods such as fresh fruits, vegetables, beans, and whole grains are healthy sources of fiber.  Pelvic floor muscle  exercises.  Bladder training, such as lengthening the amount of time between bathroom breaks, or using the bathroom at regular intervals.  Using techniques to suppress bladder urges. This can include distraction techniques or controlled breathing exercises.  Medicines to relax the bladder muscles and prevent bladder spasms.  Medicines to help slow or prevent the growth of a man's prostate.  Botox injections. These can help relax the bladder muscles.  Using pulses of electricity to help change bladder reflexes (electrical nerve stimulation).  For women, using a medical device to prevent urine leaks. This is a small, tampon-like, disposable device that is inserted into the urethra.  Injecting collagen or carbon beads (bulking agents) into the urinary sphincter. These can help thicken tissue and close the bladder opening.  Surgery. Follow these instructions at home: Lifestyle  Limit alcohol and caffeine. These can fill your bladder quickly and irritate it.  Keep yourself clean to help prevent odors and skin damage. Ask your doctor about special skin creams and cleansers that can protect the skin from urine.  Consider wearing pads or adult diapers. Make sure to change them regularly, and always change them right after experiencing incontinence. General instructions  Take over-the-counter and prescription medicines only as told by your health care provider.  Use the bathroom about every 3-4 hours, even if you do not feel the need to urinate. Try to empty your bladder completely every time. After urinating, wait a minute. Then try to urinate again.  Make sure you are in a relaxed position while urinating.  If your incontinence is caused by nerve problems, keep a log of the medicines you take and the times you go to the bathroom.  Keep all follow-up visits as told by your health care provider. This is important. Contact a health care provider if:  You have pain that gets  worse.  Your incontinence gets worse. Get help right away if:  You have a fever or chills.  You are unable to urinate.  You have redness in your groin area or down your legs. Summary  Urinary incontinence refers to a condition in which a person is unable to control where and when to pass urine.  This condition may be caused by medicines, infection, weak bladder muscles, weak pelvic floor muscles, enlargement of the prostate (in men), or surgery.  The following factors increase your risk for developing this condition: older age, obesity, pregnancy and childbirth, menopause, neurological diseases, and chronic coughing.  There are several types of urinary incontinence. They include urge incontinence, stress incontinence, overflow incontinence, and functional incontinence.  This condition is usually treated first with lifestyle and behavioral changes, such as quitting smoking, eating a healthier diet, and doing regular pelvic floor exercises. Other treatment options include medicines, bulking agents, medical devices, electrical nerve stimulation, or surgery. This information is not intended to replace advice given to you by your health care provider. Make sure you discuss any questions you have with your health care provider. Document Released: 06/02/2004 Document Revised: 08/04/2016 Document Reviewed: 08/04/2016 Elsevier Interactive Patient Education  2019 [employer]. Kegel Exercises Kegel exercises help strengthen the muscles that support the rectum, vagina, small intestine, bladder, and uterus. Doing Kegel exercises can help:  Improve bladder and bowel control.  Improve sexual response.  Reduce problems and discomfort  during pregnancy. Kegel exercises involve squeezing your pelvic floor muscles, which are the same muscles you squeeze when you try to stop the flow of urine. The exercises can be done while sitting, standing, or lying down, but it is best to vary your  position. Exercises 1. Squeeze your pelvic floor muscles tight. You should feel a tight lift in your rectal area. If you are a female, you should also feel a tightness in your vaginal area. Keep your stomach, buttocks, and legs relaxed. 2. Hold the muscles tight for up to 10 seconds. 3. Relax your muscles. Repeat this exercise 50 times a day or as many times as told by your health care provider. Continue to do this exercise for at least 4-6 weeks or for as long as told by your health care provider. This information is not intended to replace advice given to you by your health care provider. Make sure you discuss any questions you have with your health care provider. Document Released: 04/11/2012 Document Revised: 09/05/2016 Document Reviewed: 03/15/2015 Elsevier Interactive Patient Education  2019 [employer].

## 2018-05-16 NOTE — Progress Notes (Signed)
Internal Medicine Clinic Attending  Case discussed with Dr. Svalina  at the time of the visit.  We reviewed the resident's history and exam and pertinent patient test results.  I agree with the assessment, diagnosis, and plan of care documented in the resident's note.  

## 2018-05-17 ENCOUNTER — Encounter: Payer: BLUE CROSS/BLUE SHIELD | Admitting: Physical Therapy

## 2018-05-17 LAB — URINALYSIS, COMPLETE
BILIRUBIN UA: NEGATIVE
Ketones, UA: NEGATIVE
Leukocytes, UA: NEGATIVE
Nitrite, UA: NEGATIVE
PH UA: 5.5 (ref 5.0–7.5)
PROTEIN UA: NEGATIVE
RBC UA: NEGATIVE
Specific Gravity, UA: >=1.03 — AB (ref 1.005–1.030)
Urobilinogen, Ur: 0.2 mg/dL (ref 0.2–1.0)

## 2018-05-17 LAB — MICROSCOPIC EXAMINATION: Casts: NONE SEEN /lpf

## 2018-05-18 ENCOUNTER — Ambulatory Visit: Payer: BLUE CROSS/BLUE SHIELD

## 2018-05-18 DIAGNOSIS — M6281 Muscle weakness (generalized): Secondary | ICD-10-CM

## 2018-05-18 DIAGNOSIS — M25612 Stiffness of left shoulder, not elsewhere classified: Secondary | ICD-10-CM

## 2018-05-18 DIAGNOSIS — M25512 Pain in left shoulder: Secondary | ICD-10-CM

## 2018-05-18 DIAGNOSIS — R293 Abnormal posture: Secondary | ICD-10-CM | POA: Diagnosis not present

## 2018-05-18 NOTE — Therapy (Signed)
[place], [place], [address] Phone: [phone]   Fax:  [phone]  Physical Therapy Treatment  Patient Details  Name: Brooke Floyd MRN: [id number] Date of Birth: 03-Sep-1965 Referring Provider (PT): Lucious Groves, DO   Encounter Date: 05/18/2018  PT End of Session - 05/18/18 0757    Visit Number  3    Number of Visits  13    Date for PT Re-Evaluation  06/12/18    PT Start Time  0800    PT Stop Time  0835    PT Time Calculation (min)  35 min    Activity Tolerance  Patient limited by pain    Behavior During Therapy  [facility] for tasks assessed/performed       Past Medical History:  Diagnosis Date  . GERD (gastroesophageal reflux disease)   . Hyperlipidemia   . Hypertension   . Thrombocytosis ([place]) 02/08/2017  . Type 2 diabetes mellitus (HCC)    insulin dependent    Past Surgical History:  Procedure Laterality Date  . COMBINED HYSTERECTOMY ABDOMINAL W/ A&P REPAIR / OOPHORECTOMY    . WRIST ARTHROSCOPY      There were no vitals filed for this visit.  Subjective Assessment - 05/18/18 0800    Subjective  Shoulder not really better. Last time eased pain for a few hours but came back.  Has not done exrercises.  Meds don't help    Pain Score  7     Pain Location  Shoulder    Pain Orientation  Left    Pain Descriptors / Indicators  Shooting    Pain Onset  More than a month ago    Pain Frequency  Constant    Aggravating Factors   using arm     Pain Relieving Factors  biofreeze                       [facility] Adult PT Treatment/Exercise - 05/18/18 0001      Shoulder Exercises: Pulleys   Flexion  1 minute      Electrical Stimulation   Electrical Stimulation Location  L shoulder    Electrical Stimulation Action  IFC    Electrical Stimulation Parameters  L10    Electrical Stimulation Goals  Pain      Manual Therapy   Manual therapy comments  MTPR along the upper trap/  infraspinatus    Joint Mobilization  PA/ AP and inferior mobs grade I-II    Passive ROM  abduction/ flexion in supine               PT Short Term Goals - 05/18/18 4166      PT SHORT TERM GOAL #1   Title  pt to be I with inital HEP    Status  On-going      PT SHORT TERM GOAL #2   Title  pt to verbalize and demo proper posture and lifting techniques to reduce compensation and prevent R shoulder pain     Status  On-going        PT Long Term Goals - 05/01/18 1048      PT LONG TERM GOAL #1   Title  increase L shoulder flexion/ abduction to >/= 140 and IR to L5, and ER to >/= C7 with </= 2/10 pain for functional mobility for ADLs     Time  6    Period  Weeks    Status  New  Target Date  06/12/18      PT LONG TERM GOAL #2   Title  increase L shoulder strength to >/= 4+/5 in all planes to promote stability with lifting and carrying activities    Time  6    Period  Weeks    Status  New    Target Date  06/12/18      PT LONG TERM GOAL #3   Title  pt to be able to lift/lower >/= 15#, and push/pull  >/=20# for functional lifting required for work related tasks     Time  6    Period  Weeks    Status  New    Target Date  06/12/18      PT LONG TERM GOAL #4   Title   increase FOTO score to </= 34% limited to demo improvement in function     Time  6    Period  Weeks    Status  New    Target Date  06/12/18      PT LONG TERM GOAL #5   Title  pt to be I with all HEP given as of last visit to maintain and progress current level of function    Time  6    Period  Weeks    Status  New    Target Date  06/12/18            Plan - 05/18/18 1941    Clinical Impression Statement  Stopped per pt reporting too much pain to progress with manual . Applied electric stim  for pain releif.   No heat available in clinic.   She may not be able to tolerate progressive treatment and may need further assessment by MD   Will see in  2 more sessions    PT Treatment/Interventions   ADLs/Self Care Home Management;Cryotherapy;Electrical Stimulation;Iontophoresis 4mg /ml Dexamethasone;Moist Heat;Ultrasound;Therapeutic exercise;Therapeutic activities;Taping;Dry needling;Patient/family education;Manual techniques;Vasopneumatic Device    PT Next Visit Plan  review/ update HEP, shoulder mobs/ ROM, scapular setting, modalities for pain PRN, as tolerated    PT Home Exercise Plan  upper trap stretch, table slides flexion/ abduction, scapular retraction    Consulted and Agree with Plan of Care  Patient       Patient will benefit from skilled therapeutic intervention in order to improve the following deficits and impairments:  Pain, Impaired UE functional use, Increased fascial restricitons, Decreased strength, Decreased endurance, Decreased activity tolerance, Decreased range of motion, Postural dysfunction, Improper body mechanics, Increased muscle spasms, Increased edema  Visit Diagnosis: Acute pain of left shoulder  Muscle weakness (generalized)  Stiffness of left shoulder, not elsewhere classified  Abnormal posture     Problem List Patient Active Problem List   Diagnosis Date Noted  . Mixed stress and urge urinary incontinence 05/16/2018  . Acute pain of left shoulder 04/23/2018  . Thrombocytosis ([place]) 02/08/2017  . Abnormal vaginal bleeding 01/11/2017  . Hot flashes 02/19/2016  . Depression 09/29/2015  . Restless leg syndrome 06/29/2015  . Murmur 08/29/2013  . Preventative health care 01/02/2013  . HTN (hypertension) 06/14/2012  . Hyperlipidemia 06/14/2012  . Asthma, mild intermittent, well-controlled 02/17/2009  . GERD 02/17/2009  . Diabetes mellitus without complication ([place]) 74/12/1446    Darrel Hoover PT 05/18/2018, 8:24 AM  [facility] [address] [place], [place], [address] Phone: [phone]   Fax:  [phone]  Name: ARIYAN SINNETT MRN: [id number] Date of Birth: Oct 22, 1965

## 2018-05-21 NOTE — Addendum Note (Signed)
Addended by: Alphonzo Grieve on: 05/21/2018 04:31 PM   Modules accepted: Orders

## 2018-05-22 ENCOUNTER — Ambulatory Visit: Payer: BLUE CROSS/BLUE SHIELD | Admitting: Physical Therapy

## 2018-05-23 ENCOUNTER — Ambulatory Visit: Payer: BLUE CROSS/BLUE SHIELD | Admitting: Physical Therapy

## 2018-05-23 ENCOUNTER — Encounter: Payer: Self-pay | Admitting: Physical Therapy

## 2018-05-23 DIAGNOSIS — M6281 Muscle weakness (generalized): Secondary | ICD-10-CM | POA: Diagnosis not present

## 2018-05-23 DIAGNOSIS — M25612 Stiffness of left shoulder, not elsewhere classified: Secondary | ICD-10-CM | POA: Diagnosis not present

## 2018-05-23 DIAGNOSIS — M25512 Pain in left shoulder: Secondary | ICD-10-CM | POA: Diagnosis not present

## 2018-05-23 DIAGNOSIS — R293 Abnormal posture: Secondary | ICD-10-CM

## 2018-05-23 NOTE — Patient Instructions (Signed)

## 2018-05-23 NOTE — Therapy (Signed)
[place], [place], [address] Phone: [phone]   Fax:  [phone]  Physical Therapy Treatment / Discharge Summary  Patient Details  Name: CHASITIE PASSEY MRN: [id number] Date of Birth: Nov 24, 1965 Referring Provider (PT): Lucious Groves, DO   Encounter Date: 05/23/2018  PT End of Session - 05/23/18 0852    Visit Number  4    Number of Visits  13    Date for PT Re-Evaluation  06/12/18    PT Start Time  5277   pt arrived 6 min late   PT Stop Time  0936    PT Time Calculation (min)  44 min    Activity Tolerance  Patient limited by pain    Behavior During Therapy  [facility] for tasks assessed/performed       Past Medical History:  Diagnosis Date  . GERD (gastroesophageal reflux disease)   . Hyperlipidemia   . Hypertension   . Thrombocytosis ([place]) 02/08/2017  . Type 2 diabetes mellitus (HCC)    insulin dependent    Past Surgical History:  Procedure Laterality Date  . COMBINED HYSTERECTOMY ABDOMINAL W/ A&P REPAIR / OOPHORECTOMY    . WRIST ARTHROSCOPY      There were no vitals filed for this visit.  Subjective Assessment - 05/23/18 0852    Subjective  "I went to my primary care MD and I told them i wasn't getting better, I've been putting some new stuff on my shoulder which helps alittle."    Patient Stated Goals  to get better, calm down the pain, return to normal activity.     Currently in Pain?  Yes    Pain Score  0-No pain   at worst 10/10   Pain Location  Shoulder    Pain Orientation  Left    Pain Descriptors / Indicators  Aching;Shooting    Pain Type  Chronic pain    Aggravating Factors   using the arm, sleeping on the L side    Pain Relieving Factors  tiger balm, and biofreeze         [facility] PT Assessment - 05/23/18 0001      Assessment   Medical Diagnosis  L shoulder pain     Referring Provider (PT)  [place], Erik C, DO      AROM   Overall AROM Comments  significant trunk  compensation with any shoulder motion    Left Shoulder Extension  33 Degrees    Left Shoulder Flexion  100 Degrees    Left Shoulder ABduction  71 Degrees    Left Shoulder Internal Rotation  --   to middle of L glute med   Left Shoulder External Rotation  --   C3      Strength   Left Shoulder Flexion  3/5    Left Shoulder Extension  3/5    Left Shoulder ABduction  3/5    Left Shoulder Internal Rotation  3+/5    Left Shoulder External Rotation  3+/5    Left Hand Grip (lbs)  41.3   41,45,38     Palpation   Palpation comment  TTP along the upper trap, infraspinatus/ teres minor, and scalenes on the L                   [facility] Adult PT Treatment/Exercise - 05/23/18 0001      Modalities   Modalities  Cryotherapy      Cryotherapy   Number Minutes Cryotherapy  10 Minutes    Cryotherapy Location  Shoulder    Type of Cryotherapy  Ice pack      Electrical Stimulation   Electrical Stimulation Location  L shoulder    Electrical Stimulation Action  IFC    Electrical Stimulation Parameters  scan 100%, L 10.5 x 15 min    Electrical Stimulation Goals  Pain             PT Education - 05/23/18 0912    Education Details  reviewed HEP and importance of continuing. discussed progress of fucntion and benefits of seeing an orthropedic MD due lack or functinoal progress and continued significant pain. disussed benefit of getting a home TENS unit to control pain in combination with heat and where she can find units and parameters to help with pain.     Person(s) Educated  Patient    Methods  Explanation;Verbal cues    Comprehension  Verbalized understanding;Verbal cues required       PT Short Term Goals - 05/23/18 0930      PT SHORT TERM GOAL #1   Title  pt to be I with inital HEP    Time  3    Period  Weeks    Status  Not Met      PT SHORT TERM GOAL #2   Title  pt to verbalize and demo proper posture and lifting techniques to reduce compensation and prevent R shoulder  pain     Period  Weeks    Status  Not Met      PT SHORT TERM GOAL #3   Title  increase Grip strength in L hand by >/= 10# to demo improvement in function     Time  6    Period  Weeks    Status  Not Met        PT Long Term Goals - 05/23/18 0931      PT LONG TERM GOAL #1   Title  increase L shoulder flexion/ abduction to >/= 140 and IR to L5, and ER to >/= C7 with </= 2/10 pain for functional mobility for ADLs     Time  6    Period  Weeks    Status  Not Met      PT LONG TERM GOAL #2   Title  increase L shoulder strength to >/= 4+/5 in all planes to promote stability with lifting and carrying activities    Time  6    Status  Not Met      PT LONG TERM GOAL #3   Title  pt to be able to lift/lower >/= 15#, and push/pull  >/=20# for functional lifting required for work related tasks     Time  6    Status  Not Met      PT LONG TERM GOAL #4   Title   increase FOTO score to </= 34% limited to demo improvement in function     Time  6    Period  Weeks    Status  Not Met      PT LONG TERM GOAL #5   Title  pt to be I with all HEP given as of last visit to maintain and progress current level of function    Time  6    Period  Weeks    Status  Not Met            Plan - 05/23/18 0925    Clinical Impression Statement  pt reports reduction of pain with topical ointments but continues to have pain with any shoulder movement and demonstrates significant trunk compensation to promote shoulder mobility. based on no functional progress and continued shoulder pain and no consistency with her HEP plan to discharge to her MD and recommend and orthopedic consult for further assessment and pt will be discharged from PT today.     PT Treatment/Interventions  ADLs/Self Care Home Management;Cryotherapy;Electrical Stimulation;Iontophoresis 82m/ml Dexamethasone;Moist Heat;Ultrasound;Therapeutic exercise;Therapeutic activities;Taping;Dry needling;Patient/family education;Manual  techniques;Vasopneumatic Device    PT Next Visit Plan  D/C    PT Home Exercise Plan  upper trap stretch, table slides flexion/ abduction, scapular retraction    Consulted and Agree with Plan of Care  Patient       Patient will benefit from skilled therapeutic intervention in order to improve the following deficits and impairments:  Pain, Impaired UE functional use, Increased fascial restricitons, Decreased strength, Decreased endurance, Decreased activity tolerance, Decreased range of motion, Postural dysfunction, Improper body mechanics, Increased muscle spasms, Increased edema  Visit Diagnosis: Acute pain of left shoulder  Stiffness of left shoulder, not elsewhere classified  Abnormal posture  Muscle weakness (generalized)     Problem List Patient Active Problem List   Diagnosis Date Noted  . Mixed stress and urge urinary incontinence 05/16/2018  . Acute pain of left shoulder 04/23/2018  . Thrombocytosis ([place] 02/08/2017  . Abnormal vaginal bleeding 01/11/2017  . Hot flashes 02/19/2016  . Depression 09/29/2015  . Restless leg syndrome 06/29/2015  . Murmur 08/29/2013  . Preventative health care 01/02/2013  . HTN (hypertension) 06/14/2012  . Hyperlipidemia 06/14/2012  . Asthma, mild intermittent, well-controlled 02/17/2009  . GERD 02/17/2009  . Diabetes mellitus without complication ([place] 016/02/9603   LStarr Lake1/15/2020, 9:32 AM  [facility] [address]Phone: [phone]  Fax:  [phone] Name: OSHARONDA LLAMASMRN: [id number] of Birth: 109-Aug-1967     PHYSICAL THERAPY DISCHARGE SUMMARY  Visits from Start of Care: 4  Current functional level related to goals / functional outcomes: See goals,    Remaining deficits: Significant limited ROM with significant trunk compensation. Limited strength and continued pain with activity.    Education /  Equipment: HEP, TAutoliveducation, ROM  Plan: Patient agrees to discharge.  Patient goals were not met. Patient is being discharged due to lack of progress.  ?????         Kristoffer Leamon PT, DPT, LAT, ATC  05/23/18  9:34 AM

## 2018-05-24 ENCOUNTER — Encounter: Payer: BLUE CROSS/BLUE SHIELD | Admitting: Physical Therapy

## 2018-05-25 ENCOUNTER — Ambulatory Visit: Payer: BLUE CROSS/BLUE SHIELD | Admitting: Physical Therapy

## 2018-05-29 ENCOUNTER — Encounter: Payer: BLUE CROSS/BLUE SHIELD | Admitting: Physical Therapy

## 2018-05-30 ENCOUNTER — Ambulatory Visit (INDEPENDENT_AMBULATORY_CARE_PROVIDER_SITE_OTHER): Payer: BLUE CROSS/BLUE SHIELD | Admitting: Orthopaedic Surgery

## 2018-05-30 ENCOUNTER — Encounter: Payer: BLUE CROSS/BLUE SHIELD | Admitting: Physical Therapy

## 2018-05-30 ENCOUNTER — Encounter (INDEPENDENT_AMBULATORY_CARE_PROVIDER_SITE_OTHER): Payer: Self-pay | Admitting: Orthopaedic Surgery

## 2018-05-30 ENCOUNTER — Ambulatory Visit (INDEPENDENT_AMBULATORY_CARE_PROVIDER_SITE_OTHER): Payer: BLUE CROSS/BLUE SHIELD

## 2018-05-30 DIAGNOSIS — M25512 Pain in left shoulder: Secondary | ICD-10-CM | POA: Diagnosis not present

## 2018-05-30 MED ORDER — LIDOCAINE HCL 1 % IJ SOLN
3.0000 mL | INTRAMUSCULAR | Status: AC | PRN
  Administered 2018-05-30: 3 mL

## 2018-05-30 MED ORDER — TIZANIDINE HCL 4 MG PO TABS: 4.0000 mg | ORAL_TABLET | Freq: Three times a day (TID) | ORAL | 0 refills | Status: DC | PRN

## 2018-05-30 MED ORDER — METHYLPREDNISOLONE ACETATE 40 MG/ML IJ SUSP
40.0000 mg | INTRAMUSCULAR | Status: AC | PRN
  Administered 2018-05-30: 40 mg via INTRA_ARTICULAR

## 2018-05-30 NOTE — Progress Notes (Signed)
Office Visit Note   Patient: Brooke Floyd           Date of Birth: [birth year]           MRN: [id number] Visit Date: 05/30/2018              Requested by: Bartholomew Crews, MD [address] [place], [place] [address] PCP: Alphonzo Grieve, MD   Assessment & Plan: Visit Diagnoses:  1. Acute pain of left shoulder     Plan: I did send in some Zanaflex for her discomfort and provided a steroid injection subacromial space after counseling her about this.  She does need an intra-articular steroid injection as well and it is absolutely necessary we obtain an MRI of her left shoulder given the profound weakness she has and the large calcifications were seen in the subacromial outlet.  We need to better ascertain what is going on with a rotator cuff.  All question concerns were answered and addressed.  She is going to still work on trying to get her shoulder moving.  We will see her back go over the MRI soon and hopefully she will have had intra-articular steroid injection as well under fluoroscopy by Dr. Ernestina Patches in the left shoulder.  Follow-Up Instructions: Return in about 2 weeks (around 06/13/2018).   Orders:  Orders Placed This Encounter  Procedures  . Large Joint Inj  . XR Shoulder Left   Meds ordered this encounter  Medications  . tiZANidine (ZANAFLEX) 4 MG tablet    Sig: Take 1 tablet (4 mg total) by mouth every 8 (eight) hours as needed for muscle spasms.    Dispense:  40 tablet    Refill:  0      Procedures: Large Joint Inj: L subacromial bursa on 05/30/2018 3:31 PM Indications: pain and diagnostic evaluation Details: 22 G 1.5 in needle  Arthrogram: No  Medications: 3 mL lidocaine 1 %; 40 mg methylPREDNISolone acetate 40 MG/ML Outcome: tolerated well, no immediate complications Procedure, treatment alternatives, risks and benefits explained, specific risks discussed. Consent was given by the patient. Immediately prior to procedure a time out was called to  verify the correct patient, procedure, equipment, support staff and site/side marked as required. Patient was prepped and draped in the usual sterile fashion.       Clinical Data: No additional findings.   Subjective: Chief Complaint  Patient presents with  . 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  She is never injured the shoulder before.  Now she is developing a shoulder that is difficult to move with a frozen shoulder based on the pain she is been having and guarding her shoulder.  She is had her husband work on it some being that he is a Geophysicist/field seismologist.  She is a diabetic but reports good control.  The pain is quite severe and is waking her up at night as well.  She has been unable to lift up overhead and reach behind her with that left shoulder.  She denies any other injuries.  She denies any neck issues.  She denies any numbness and tingling in her left hand.  HPI  Review of Systems She currently denies a headache, chest pain, shortness of breath, fever, chills, nausea, vomiting  Objective: Vital Signs: There were no vitals taken for this visit.  Physical Exam She is alert and oriented  x3 and in no acute distress but obvious discomfort Ortho Exam Examination of the left shoulder shows no swelling and is clinically well located.  Her mobility is significant limited and she does show signs of arthrofibrosis and a frozen shoulder. Specialty Comments:  No specialty comments available.  Imaging: Xr Shoulder Left  Result Date: 05/30/2018 3 views of the left shoulder shows is well located.  There is large calcifications in the subacromial outlet suggesting severe calcific tendinitis or even a rotator cuff tear.    PMFS History: Patient Active Problem List   Diagnosis Date Noted  . Mixed stress and urge urinary incontinence 05/16/2018  . Acute pain of left shoulder  04/23/2018  . Thrombocytosis ([place]) 02/08/2017  . Abnormal vaginal bleeding 01/11/2017  . Hot flashes 02/19/2016  . Depression 09/29/2015  . Restless leg syndrome 06/29/2015  . Murmur 08/29/2013  . Preventative health care 01/02/2013  . HTN (hypertension) 06/14/2012  . Hyperlipidemia 06/14/2012  . Asthma, mild intermittent, well-controlled 02/17/2009  . GERD 02/17/2009  . Diabetes mellitus without complication ([facility]) 44/81/8563   Past Medical History:  Diagnosis Date  . GERD (gastroesophageal reflux disease)   . Hyperlipidemia   . Hypertension   . Thrombocytosis ([place]) 02/08/2017  . Type 2 diabetes mellitus ([facility])    insulin dependent    Family History  Problem Relation Age of Onset  . Coronary artery disease Father   . Diabetes Father   . Diabetes Mother   . Breast cancer Neg Hx   . Colon cancer Neg Hx     Past Surgical History:  Procedure Laterality Date  . COMBINED HYSTERECTOMY ABDOMINAL W/ A&P REPAIR / OOPHORECTOMY    . WRIST ARTHROSCOPY     Social History   Occupational History  . Occupation: CNA  Tobacco Use  . Smoking status: Never Smoker  . Smokeless tobacco: Never Used  Substance and Sexual Activity  . Alcohol use: No    Alcohol/week: 0.0 standard drinks  . Drug use: No  . Sexual activity: Not Currently    Birth control/protection: Surgical

## 2018-05-31 ENCOUNTER — Encounter: Payer: BLUE CROSS/BLUE SHIELD | Admitting: Physical Therapy

## 2018-05-31 ENCOUNTER — Other Ambulatory Visit (INDEPENDENT_AMBULATORY_CARE_PROVIDER_SITE_OTHER): Payer: Self-pay

## 2018-05-31 DIAGNOSIS — M25512 Pain in left shoulder: Secondary | ICD-10-CM

## 2018-06-01 ENCOUNTER — Encounter: Payer: BLUE CROSS/BLUE SHIELD | Admitting: Physical Therapy

## 2018-06-04 ENCOUNTER — Other Ambulatory Visit: Payer: Self-pay | Admitting: Internal Medicine

## 2018-06-04 DIAGNOSIS — E119 Type 2 diabetes mellitus without complications: Secondary | ICD-10-CM

## 2018-06-05 ENCOUNTER — Encounter: Payer: BLUE CROSS/BLUE SHIELD | Admitting: Physical Therapy

## 2018-06-06 ENCOUNTER — Encounter: Payer: BLUE CROSS/BLUE SHIELD | Admitting: Physical Therapy

## 2018-06-07 ENCOUNTER — Other Ambulatory Visit: Payer: Self-pay | Admitting: Internal Medicine

## 2018-06-07 ENCOUNTER — Encounter: Payer: BLUE CROSS/BLUE SHIELD | Admitting: Physical Therapy

## 2018-06-08 ENCOUNTER — Encounter: Payer: BLUE CROSS/BLUE SHIELD | Admitting: Physical Therapy

## 2018-06-13 ENCOUNTER — Other Ambulatory Visit: Payer: Self-pay | Admitting: *Deleted

## 2018-06-13 ENCOUNTER — Ambulatory Visit (INDEPENDENT_AMBULATORY_CARE_PROVIDER_SITE_OTHER): Payer: BLUE CROSS/BLUE SHIELD | Admitting: Orthopaedic Surgery

## 2018-06-13 DIAGNOSIS — E119 Type 2 diabetes mellitus without complications: Secondary | ICD-10-CM

## 2018-06-13 MED ORDER — EMPAGLIFLOZIN-METFORMIN HCL 12.5-1000 MG PO TABS: 1.0000 | ORAL_TABLET | Freq: Two times a day (BID) | ORAL | 2 refills | Status: DC

## 2018-06-22 ENCOUNTER — Ambulatory Visit (INDEPENDENT_AMBULATORY_CARE_PROVIDER_SITE_OTHER): Payer: BLUE CROSS/BLUE SHIELD | Admitting: Physical Medicine and Rehabilitation

## 2018-06-22 ENCOUNTER — Ambulatory Visit (INDEPENDENT_AMBULATORY_CARE_PROVIDER_SITE_OTHER): Payer: Self-pay

## 2018-06-22 ENCOUNTER — Encounter (INDEPENDENT_AMBULATORY_CARE_PROVIDER_SITE_OTHER): Payer: Self-pay | Admitting: Physical Medicine and Rehabilitation

## 2018-06-22 DIAGNOSIS — G8929 Other chronic pain: Secondary | ICD-10-CM | POA: Diagnosis not present

## 2018-06-22 DIAGNOSIS — M25512 Pain in left shoulder: Secondary | ICD-10-CM | POA: Diagnosis not present

## 2018-06-22 NOTE — Progress Notes (Signed)
 .  Numeric Pain Rating Scale and Functional Assessment Average Pain 8   In the last MONTH (on 0-10 scale) has pain interfered with the following?  1. General activity like being  able to carry out your everyday physical activities such as walking, climbing stairs, carrying groceries, or moving a chair?  Rating(6)   -Dye Allergies.  

## 2018-06-22 NOTE — Progress Notes (Signed)
   Brooke Floyd - 53 y.o. female MRN [id number]  Date of birth: 1965-10-14  Office Visit Note: Visit Date: 06/22/2018 PCP: Alphonzo Grieve, MD Referred by: Alphonzo Grieve, MD  Subjective: Chief Complaint  Patient presents with  . Left Shoulder - Pain  . Left Arm - Pain   HPI:  Brooke Floyd is a 53 y.o. female who comes in today At the request of Dr. Jean Rosenthal for intra-articular glenohumeral joint injection fluoroscopic guidance on the left.  Patient is having worsening left shoulder pain that refers down into the arm.  Denies any real numbness or tingling.  Pain started in December of last year.  She reports movement of the arm does increase her symptoms.  She reports her pain is an 8 out of 10.  ROS Otherwise per HPI.  Assessment & Plan: Visit Diagnoses:  1. Chronic left shoulder pain     Plan: No additional findings.   Meds & Orders: No orders of the defined types were placed in this encounter.   Orders Placed This Encounter  Procedures  . Large Joint Inj: L glenohumeral  . XR C-ARM NO REPORT    Follow-up: Return for Jean Rosenthal, MD as scheduled.   Procedures: Large Joint Inj: L glenohumeral on 06/22/2018 10:28 AM Indications: pain and diagnostic evaluation Details: 22 G 3.5 in needle, fluoroscopy-guided anteromedial approach  Arthrogram: No  Medications: 3 mL bupivacaine 0.5 %; 60 mg triamcinolone acetonide 40 MG/ML Outcome: tolerated well, no immediate complications  There was excellent flow of contrast producing a partial arthrogram of the glenohumeral joint. The patient did have SOME MILD relief of symptoms during the anesthetic phase of the injection. Procedure, treatment alternatives, risks and benefits explained, specific risks discussed. Consent was given by the patient. Immediately prior to procedure a time out was called to verify the correct patient, procedure, equipment, support staff and site/side marked as required. Patient was  prepped and draped in the usual sterile fashion.      No notes on file   Clinical History: No specialty comments available.     Objective:  VS:  HT:    WT:   BMI:     BP:   HR: bpm  TEMP: ( )  RESP:  Physical Exam  Ortho Exam Imaging: No results found.

## 2018-06-26 MED ORDER — TRIAMCINOLONE ACETONIDE 40 MG/ML IJ SUSP
60.0000 mg | INTRAMUSCULAR | Status: AC | PRN
  Administered 2018-06-22: 60 mg via INTRA_ARTICULAR

## 2018-06-26 MED ORDER — BUPIVACAINE HCL 0.5 % IJ SOLN
3.0000 mL | INTRAMUSCULAR | Status: AC | PRN
  Administered 2018-06-22: 3 mL via INTRA_ARTICULAR

## 2018-06-27 ENCOUNTER — Ambulatory Visit
Admission: RE | Admit: 2018-06-27 | Discharge: 2018-06-27 | Disposition: A | Discharge status: Home / Self Care | Payer: BLUE CROSS/BLUE SHIELD | Source: Ambulatory Visit | Attending: Orthopaedic Surgery | Admitting: Orthopaedic Surgery

## 2018-06-27 DIAGNOSIS — M75102 Unspecified rotator cuff tear or rupture of left shoulder, not specified as traumatic: Secondary | ICD-10-CM | POA: Diagnosis not present

## 2018-06-27 DIAGNOSIS — M25512 Pain in left shoulder: Secondary | ICD-10-CM

## 2018-07-04 ENCOUNTER — Ambulatory Visit (INDEPENDENT_AMBULATORY_CARE_PROVIDER_SITE_OTHER): Payer: BLUE CROSS/BLUE SHIELD | Admitting: Orthopaedic Surgery

## 2018-07-04 ENCOUNTER — Encounter (INDEPENDENT_AMBULATORY_CARE_PROVIDER_SITE_OTHER): Payer: Self-pay | Admitting: Orthopaedic Surgery

## 2018-07-04 DIAGNOSIS — M7502 Adhesive capsulitis of left shoulder: Secondary | ICD-10-CM

## 2018-07-04 DIAGNOSIS — M25512 Pain in left shoulder: Secondary | ICD-10-CM | POA: Diagnosis not present

## 2018-07-04 NOTE — Progress Notes (Signed)
Patient comes in today to go over an MRI of her left shoulder.  He had an acute injury to the shoulder several months ago working with the patient.  She is a Quarry manager.  She felt a pop in that left shoulder.  She is a type II diabetic but under excellent control.  At this point she is developed significant adhesive capsulitis of the shoulder.  She has been to physical therapy.  She has had a subacromial steroid injection and then an intra-articular steroid injection under direct fluoroscopy in the left shoulder.  She is still the same point in terms of significant left shoulder pain and decreased motion.  On exam she still shows significant deficits in abduction and external rotation as well as forward flexion of the left shoulder.  She definitely developed adhesive capsulitis and this continues.  The MRI does show a small bursal surface tear of the rotator cuff and tendinosis of the rotator cuff itself.  More significant is the significant synovitis in the rotator interval.  At this point I think she would benefit from a surgical intervention of her left shoulder.  I explained this to her in detail.  This would include a manipulation under anesthesia followed by a synovectomy and debridement with subacromial decompression of the left shoulder.  The goal would then be to get her into therapy aggressively to continue work on motion of the shoulder.  Of note the steroid injection did not affect her blood glucose in the detrimental way at all.  I explained in detail again what surgery involves and we discussed this in detail.  I gave her surgery schedulers card because she does not think about this.  I told her we would do this as an outpatient and then set up therapy accordingly postoperative for her left shoulder.

## 2018-07-25 ENCOUNTER — Encounter: Payer: BLUE CROSS/BLUE SHIELD | Admitting: Internal Medicine

## 2018-08-01 ENCOUNTER — Telehealth: Payer: Self-pay | Admitting: Internal Medicine

## 2018-08-01 DIAGNOSIS — J4520 Mild intermittent asthma, uncomplicated: Secondary | ICD-10-CM

## 2018-08-01 DIAGNOSIS — I10 Essential (primary) hypertension: Secondary | ICD-10-CM

## 2018-08-01 DIAGNOSIS — E119 Type 2 diabetes mellitus without complications: Secondary | ICD-10-CM

## 2018-08-01 DIAGNOSIS — G2581 Restless legs syndrome: Secondary | ICD-10-CM

## 2018-08-01 DIAGNOSIS — R232 Flushing: Secondary | ICD-10-CM

## 2018-08-01 MED ORDER — HYDROCHLOROTHIAZIDE 12.5 MG PO CAPS: 12.5000 mg | ORAL_CAPSULE | Freq: Every day | ORAL | 3 refills | Status: DC

## 2018-08-01 MED ORDER — FAMOTIDINE 40 MG PO TABS: 40.0000 mg | ORAL_TABLET | Freq: Every day | ORAL | 2 refills | Status: DC

## 2018-08-01 MED ORDER — MONTELUKAST SODIUM 10 MG PO TABS: 10.0000 mg | ORAL_TABLET | Freq: Every day | ORAL | 3 refills | Status: DC

## 2018-08-01 MED ORDER — BASAGLAR KWIKPEN 100 UNIT/ML ~~LOC~~ SOPN: 20.0000 [IU] | PEN_INJECTOR | Freq: Every day | SUBCUTANEOUS | 2 refills | Status: DC

## 2018-08-01 MED ORDER — QUINAPRIL HCL 40 MG PO TABS: ORAL_TABLET | ORAL | 3 refills | Status: DC

## 2018-08-01 MED ORDER — ALBUTEROL SULFATE HFA 108 (90 BASE) MCG/ACT IN AERS: 2.0000 | INHALATION_SPRAY | Freq: Four times a day (QID) | RESPIRATORY_TRACT | 12 refills | Status: DC | PRN

## 2018-08-01 MED ORDER — GABAPENTIN 300 MG PO CAPS: ORAL_CAPSULE | ORAL | 5 refills | Status: DC

## 2018-08-01 MED ORDER — FLUTICASONE FUROATE-VILANTEROL 100-25 MCG/INH IN AEPB: 1.0000 | INHALATION_SPRAY | Freq: Every day | RESPIRATORY_TRACT | 5 refills | Status: DC

## 2018-08-01 MED ORDER — PRAMIPEXOLE DIHYDROCHLORIDE 0.5 MG PO TABS: 0.5000 mg | ORAL_TABLET | Freq: Every day | ORAL | 1 refills | Status: DC

## 2018-08-01 MED ORDER — EMPAGLIFLOZIN-METFORMIN HCL 12.5-1000 MG PO TABS: 1.0000 | ORAL_TABLET | Freq: Two times a day (BID) | ORAL | 2 refills | Status: DC

## 2018-08-01 MED ORDER — SIMVASTATIN 80 MG PO TABS: ORAL_TABLET | ORAL | 3 refills | Status: DC

## 2018-08-01 NOTE — Telephone Encounter (Signed)
Called patient to discuss her DM and see if she needs refill until her f/u in late May.  Patient states that she has made significant dietary changes and has had lower CBGs. Most recently she thinks her highest reading has been 112, but it frequently dips to the 60s with associated symptoms of hypoglycemia. She treats with juice with improvement in symptoms but does not recheck her CBG afterwards. She states she has only been taking the glargine 30 units daily - she self increased due to running out of glipizide and synjardy a month or so ago.   Her insurance has changed preferred [facility]ies [facility], which she has been trying to adjust, however has not had luck reaching them. As a result she has been off of synjardy and glipizide for about a month and only has about a week left on her blood pressure and other medicines.   Patient agreed to at least temporarily switch [facility] a [facility] to ensure she has medicines while working out the issue with her insurance.   For her DM: Stop Glipizide in setting of hypoglycemia Will decreased glargine to 20 units daily Refill Synjardi 12.5-1000mg  BID Refill gabapentin Refill simvastatin 80mg  daily We discussed checking CBGs when having hypoglycemic symptoms and appropriate management of hypoglycemia to ensure CBG >80 Will have our DM educator call her next week to see if hypoglycemic episodes are persistent and if need for further insulin adjustment is necessary - she is in agreement with this.  For her HTN: Refill quinapril 40mg  daily and HCTZ 12.5mg  daily  For her asthma: Refill montelukast 10mg  qhs, Breo 100-33mcg once a day, albuterol prn  [facility] [address].  Alphonzo Grieve, MD IMTS - PGY3

## 2018-08-04 ENCOUNTER — Other Ambulatory Visit: Payer: Self-pay | Admitting: Internal Medicine

## 2018-08-04 DIAGNOSIS — E119 Type 2 diabetes mellitus without complications: Secondary | ICD-10-CM

## 2018-08-06 ENCOUNTER — Telehealth: Payer: Self-pay | Admitting: Dietician

## 2018-08-06 NOTE — Telephone Encounter (Signed)
Blood sugar follow up: Brooke Floyd says she just picked up the synjardy and lantus this weekend and restarted it. she has been taking 20 units of lantus. - She has not had low blood sugar or symptoms of it. Her blood sugar was 133 today, she thinks that is high. It used to be 117 at the highest. We discussed a premeal goal of 90-130. She asked for a call back in 1 week to see if it is lower by then.  Debera Lat, RD 08/06/2018 3:17 PM.

## 2018-08-06 NOTE — Telephone Encounter (Signed)
-----   Message from Alphonzo Grieve, MD sent at 08/02/2018 10:55 AM EDT ----- Regarding: RE: Follow up call Thank you! Gorica ----- Message ----- From: Plyler, Chauncey Reading, RD Sent: 08/02/2018  10:18 AM EDT To: Alphonzo Grieve, MD Subject: RE: Follow up call                             Will do! ----- Message ----- From: Alphonzo Grieve, MD Sent: 08/01/2018   4:04 PM EDT To: Chauncey Reading Plyler, RD Subject: Follow up call                                 Brooke Boys,  Ms. Dunsworth has been experiencing a lot of hypoglycemia recently after diet changes and increasing her glargine dose by herself (ran out of synjardi and glipizide so was compensating). We lowered the glargine to 20 units daily, restarted synjardi and stopped the glipizide.  Do you mind calling her sometime next week to see how her CBGs have been running and if we need to further adjust her regimen if hypoglycemia is persistent?  Thank you!  Estill Dooms

## 2018-08-06 NOTE — Telephone Encounter (Signed)
Agree with your recommendations for her. Thank you!  Estill Dooms

## 2018-08-13 ENCOUNTER — Telehealth: Payer: Self-pay | Admitting: Dietician

## 2018-08-13 NOTE — Telephone Encounter (Signed)
Thank you! I agree with that plan, it would be helpful to get objective data on her glycemic control in order to optimize her regimen.  Brooke Floyd

## 2018-08-13 NOTE — Telephone Encounter (Signed)
Brooke Floyd reports her blood sugars are "Not good." She feels it is because she is eating "More bread and pasta". She is not at home during our call and says her blood sugars are in the 200s.  For example- today in the morning she had symptoms of low blood sugar,  did not check her blood sugar before eating candy, some time later it was 199. When asked about physical activity she replies that she is still working and does leg exercises.  I suggested maybe her medicine was too strong and she disagreed. She agree to keep a record of her blood sugars this week with day, time and if she had eaten before the results and send them to [place] via mychart at the end of the week.   Debera Lat, RD 08/13/2018 12:00 PM.

## 2018-08-23 ENCOUNTER — Telehealth: Payer: Self-pay | Admitting: Dietician

## 2018-08-23 DIAGNOSIS — G2581 Restless legs syndrome: Secondary | ICD-10-CM

## 2018-08-23 DIAGNOSIS — E119 Type 2 diabetes mellitus without complications: Secondary | ICD-10-CM

## 2018-08-23 NOTE — Telephone Encounter (Signed)
Called Brooke Floyd for blood sugar results as follow up to last week's conversation:   Date  Breakfast Lunch  Dinner   Bedtime  4/16 117 4/15 87 4/14 104    229- "eating crazy" 4/13 105 4/12 4/11      190- ate late and cheetos 4/10     142 4/9  After breakfast-137      4/7 85  Breakfast: spec k with strawberry, milk and banana Lunch: Vegetable soup, fruit,  Dinner: chicken salad for dinner, fruit & yogurt salad Snacks- Cheetos  She asked about lowering afternoon.eveing readings. We talked about lower carb foods/snacks. She denied need for written information.   She wanted me to mention to Dr. Jari Favre: Problems with her legs and feet this week- they hurt at night like crazy for about 10 minutes then goes away, also her hand started hurting. She has not lifted or anything that might cause this. She wondered if this was related to what Dr. Jari Favre was talking about- "about platelets or something".   Brooke Floyd, RD 08/23/2018 11:55 AM.

## 2018-08-24 MED ORDER — PRAMIPEXOLE DIHYDROCHLORIDE 0.75 MG PO TABS: 0.7500 mg | ORAL_TABLET | Freq: Every day | ORAL | 2 refills | Status: DC

## 2018-08-24 MED ORDER — BASAGLAR KWIKPEN 100 UNIT/ML ~~LOC~~ SOPN: 18.0000 [IU] | PEN_INJECTOR | Freq: Every day | SUBCUTANEOUS | 2 refills | Status: DC

## 2018-08-24 NOTE — Addendum Note (Signed)
Addended by: Alphonzo Grieve on: 08/24/2018 03:55 PM   Modules accepted: Orders

## 2018-08-24 NOTE — Telephone Encounter (Signed)
Called patient in regards to her diabetes and bil foot pain.   From CBG review fasting CBGs 87-117; she denies further symptoms of hypoglycemia. Her afternoon CBGs are higher sometimes in the 200s. She will continue to work on her food choices.  She endorses sharp bilateral foot and leg pain that is similar to her prior described restless legs; she denies aching or cramps. Pain will last about 10 mins at a time and has been ongoing for the last two weeks each night. She continues to take gabapentin and pramipexole. She is not very active due to having to work from home currently. We discussed that this was likely not related to her thrombocytosis which has been chronic and stable but had incomplete workup due to patient deference and now limiting non urgent outpatient workup due to [place] -19 pandemic.  Plan: -for DM, decrease glargine to 18 units qhs as fasting CBGs on lower end of target; patient will continue to work on food choices -for bil leg pain - sounds more like neuropathy but patient states exact pain as restless leg symptoms; discussed scheduled breaks to walk around house; will increase pramipexole to max dose of 0.75. If not improved with this may try lyrica instead of gabapentin in future.   Alphonzo Grieve, MD IMTS - PGY3

## 2018-08-29 ENCOUNTER — Other Ambulatory Visit: Payer: Self-pay | Admitting: *Deleted

## 2018-08-29 MED ORDER — GLUCOSE BLOOD VI STRP: ORAL_STRIP | 12 refills | Status: DC

## 2018-08-29 NOTE — Telephone Encounter (Signed)
[facility] Test strips Freestyle Lite. Please include directions and dx code. Thanks

## 2018-09-05 ENCOUNTER — Telehealth: Payer: Self-pay | Admitting: Dietician

## 2018-09-05 DIAGNOSIS — E119 Type 2 diabetes mellitus without complications: Secondary | ICD-10-CM

## 2018-09-05 MED ORDER — FREESTYLE FREEDOM LITE W/DEVICE KIT: PACK | 1 refills | Status: DC

## 2018-09-05 NOTE — Telephone Encounter (Signed)
Brooke Floyd calls saying "Something is wrong with my meter".  It gave me a reading of 52 and she felt fine so repeated it and it gave her a result of 109. She says her meter was given to her so she doesn;t know how old it is. New meter prescription requested.

## 2018-09-26 ENCOUNTER — Encounter: Payer: BLUE CROSS/BLUE SHIELD | Admitting: Internal Medicine

## 2018-10-07 ENCOUNTER — Encounter: Payer: Self-pay | Admitting: *Deleted

## 2018-10-28 ENCOUNTER — Other Ambulatory Visit: Payer: Self-pay | Admitting: Internal Medicine

## 2018-10-28 DIAGNOSIS — G2581 Restless legs syndrome: Secondary | ICD-10-CM

## 2018-10-29 NOTE — Telephone Encounter (Signed)
PCP appt 1st available

## 2018-11-02 ENCOUNTER — Other Ambulatory Visit: Payer: Self-pay

## 2018-11-02 NOTE — Telephone Encounter (Signed)
Pt states insurance will not paid for 30 days supply on   montelukast (SINGULAIR) 10 MG tablet.   Requesting for 90 days supply, pt [facility]  [id number] [place] [place] [phone] (Phone) [phone] (Fax)

## 2018-11-05 ENCOUNTER — Encounter: Payer: Self-pay | Admitting: Internal Medicine

## 2018-11-05 MED ORDER — MONTELUKAST SODIUM 10 MG PO TABS: 10.0000 mg | ORAL_TABLET | Freq: Every day | ORAL | 1 refills | Status: DC

## 2018-11-16 ENCOUNTER — Other Ambulatory Visit: Payer: Self-pay | Admitting: Family Medicine

## 2018-11-16 ENCOUNTER — Other Ambulatory Visit: Payer: Self-pay | Admitting: Family

## 2018-11-16 DIAGNOSIS — Z1231 Encounter for screening mammogram for malignant neoplasm of breast: Secondary | ICD-10-CM

## 2018-11-22 DIAGNOSIS — E1122 Type 2 diabetes mellitus with diabetic chronic kidney disease: Secondary | ICD-10-CM | POA: Diagnosis not present

## 2018-11-22 DIAGNOSIS — Z79899 Other long term (current) drug therapy: Secondary | ICD-10-CM | POA: Diagnosis not present

## 2018-12-05 ENCOUNTER — Encounter: Payer: BLUE CROSS/BLUE SHIELD | Admitting: Internal Medicine

## 2019-01-03 ENCOUNTER — Ambulatory Visit: Payer: BLUE CROSS/BLUE SHIELD

## 2019-02-07 DIAGNOSIS — E1122 Type 2 diabetes mellitus with diabetic chronic kidney disease: Secondary | ICD-10-CM | POA: Diagnosis not present

## 2019-02-07 DIAGNOSIS — Z0000 Encounter for general adult medical examination without abnormal findings: Secondary | ICD-10-CM | POA: Diagnosis not present

## 2019-02-07 DIAGNOSIS — Z79899 Other long term (current) drug therapy: Secondary | ICD-10-CM | POA: Diagnosis not present

## 2019-02-21 ENCOUNTER — Ambulatory Visit: Payer: BC Managed Care – PPO

## 2019-03-05 ENCOUNTER — Other Ambulatory Visit: Payer: Self-pay

## 2019-03-05 ENCOUNTER — Ambulatory Visit
Admission: RE | Admit: 2019-03-05 | Discharge: 2019-03-05 | Disposition: A | Discharge status: Home / Self Care | Payer: BC Managed Care – PPO | Source: Ambulatory Visit | Attending: Family | Admitting: Family

## 2019-03-05 DIAGNOSIS — Z1231 Encounter for screening mammogram for malignant neoplasm of breast: Secondary | ICD-10-CM | POA: Diagnosis not present

## 2019-03-06 ENCOUNTER — Other Ambulatory Visit: Payer: Self-pay | Admitting: *Deleted

## 2019-03-06 NOTE — Telephone Encounter (Signed)
error 

## 2019-03-07 ENCOUNTER — Ambulatory Visit: Payer: BC Managed Care – PPO

## 2019-03-20 ENCOUNTER — Other Ambulatory Visit: Payer: Self-pay

## 2019-03-20 ENCOUNTER — Ambulatory Visit (INDEPENDENT_AMBULATORY_CARE_PROVIDER_SITE_OTHER): Payer: BC Managed Care – PPO | Admitting: Sports Medicine

## 2019-03-20 ENCOUNTER — Encounter: Payer: Self-pay | Admitting: Sports Medicine

## 2019-03-20 VITALS — BP 122/68 | Ht 62.0 in | Wt 124.0 lb

## 2019-03-20 DIAGNOSIS — M654 Radial styloid tenosynovitis [de Quervain]: Secondary | ICD-10-CM | POA: Diagnosis not present

## 2019-03-20 MED ORDER — MELOXICAM 15 MG PO TABS: 15.0000 mg | ORAL_TABLET | Freq: Every day | ORAL | 0 refills | Status: DC | PRN

## 2019-03-20 MED ORDER — METHYLPREDNISOLONE ACETATE 40 MG/ML IJ SUSP
20.0000 mg | Freq: Once | INTRAMUSCULAR | Status: AC
  Administered 2019-03-20: 20 mg via INTRA_ARTICULAR

## 2019-03-20 NOTE — Patient Instructions (Addendum)
Your wrist pain is caused by inflammation of the tendons that help move your thumb and wrist.  This is also known as de Quervain's tenosynovitis.  It is an overuse injury of these tendons -Wear the wrist brace as needed for comfort over the next several weeks -The steroid injection that you received today will help reduce some of the inflammation of the pain -A prescription for meloxicam was sent to the pharmacy for you.  Do not take this with other NSAIDs.  You may continue to take Tylenol though. I will see you back in 3 to 4 weeks

## 2019-03-20 NOTE — Addendum Note (Signed)
Addended by: Cyd Silence on: 03/20/2019 03:49 PM   Modules accepted: Orders

## 2019-03-20 NOTE — Progress Notes (Addendum)
PCP: Suella Broad, FNP  Subjective:   HPI: Patient is a 53 y.o. female here for evaluation of left hand and wrist pain.  Pain started several weeks ago.  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  She notes the pain is on the radial aspect of her hand.  It does not radiate.  She has no associated numbness or tingling.  She denies any bruising or swelling.  Pain is gotten progressively worse over the last several days.  Patient notes the pain is a sharp stabbing quality.  Current pain is an 8 out of 10.  She has been taking over-the-counter ibuprofen which is only mildly improved her symptoms.   Review of Systems: See HPI above.  Past Medical History:  Diagnosis Date  . Asthma   . GERD (gastroesophageal reflux disease)   . Hyperlipidemia   . Hypertension   . Thrombocytosis ([place]) 02/08/2017  . Type 2 diabetes mellitus (HCC)    insulin dependent    Current Outpatient Medications on File Prior to Visit  Medication Sig Dispense Refill  . Blood Glucose Monitoring Suppl (FREESTYLE FREEDOM LITE) w/Device KIT check blood sugar up to 3 times a day 1 each 1  . gabapentin (NEURONTIN) 300 MG capsule Take 3 capsules 2 hours before bedtime; may take 1 capsule as needed during the day. 120 capsule 5  . glucose blood (FREESTYLE LITE) test strip Test blood sugar 3 times a day 100 each 12  . hydrochlorothiazide (MICROZIDE) 12.5 MG capsule Take 1 capsule (12.5 mg total) by mouth daily. 90 capsule 3  . Insulin Glargine (BASAGLAR KWIKPEN) 100 UNIT/ML SOPN Inject 0.18 mLs (18 Units total) into the skin at bedtime. 15 mL 2  . Insulin Pen Needle (PEN NEEDLES 3/16") 31G X 5 MM MISC Use to inject Lantus at bedtime. The patient is insulin requiring, ICD 10 code E11.65. The patient injects 1 times per day. 100 each 6  . montelukast (SINGULAIR) 10 MG tablet Take 1 tablet (10 mg total) by mouth at bedtime. 90 tablet 1   . quinapril (ACCUPRIL) 40 MG tablet TAKE ONE TABLET BY MOUTH DAILY 90 tablet 3  . rOPINIRole (REQUIP) 1 MG tablet Take 1 mg by mouth at bedtime.    . simvastatin (ZOCOR) 80 MG tablet TAKE 1 TABLET BY MOUTH BEFORE BEDTIME 90 tablet 3  . SYNJARDY 12.09-998 MG TABS TAKE 1 TABLET BY MOUTH 2 (TWO) TIMES DAILY WITH A MEAL. 180 tablet 0  . albuterol (PROVENTIL HFA;VENTOLIN HFA) 108 (90 Base) MCG/ACT inhaler Inhale 2 puffs into the lungs every 6 (six) hours as needed for wheezing or shortness of breath. (Patient not taking: Reported on 03/20/2019) 1 Inhaler 12  . famotidine (PEPCID) 40 MG tablet TAKE 1 TABLET BY MOUTH EVERYDAY AT BEDTIME (Patient not taking: Reported on 03/20/2019) 90 tablet 0  . fluticasone furoate-vilanterol (BREO ELLIPTA) 100-25 MCG/INH AEPB Inhale 1 puff into the lungs daily. (Patient not taking: Reported on 03/20/2019) 60 each 5  . pramipexole (MIRAPEX) 0.75 MG tablet TAKE 1 TABLET (0.75 MG TOTAL) BY MOUTH AT BEDTIME. (Patient not taking: Reported on 03/20/2019) 90 tablet 1   No current facility-administered medications on file prior to visit.     Past Surgical History:  Procedure Laterality Date  . COMBINED HYSTERECTOMY ABDOMINAL W/ A&P REPAIR / OOPHORECTOMY    . WRIST ARTHROSCOPY      Allergies  Allergen Reactions  . Diclofenac  Sodium Itching  . Hydrocodone Itching  . Penicillins Other (See Comments)    convulsions  . Voltaren [Diclofenac] Itching  . Codeine Itching    Social History   Socioeconomic History  . Marital status: Married    Spouse name: Not on file  . Number of children: 2  . Years of education: 23  . Highest education level: Not on file  Occupational History  . Occupation: CNA  Social Needs  . Financial resource strain: Not on file  . Food insecurity    Worry: Not on file    Inability: Not on file  . Transportation needs    Medical: Not on file    Non-medical: Not on file  Tobacco Use  . Smoking status: Never Smoker  . Smokeless tobacco:  Never Used  Substance and Sexual Activity  . Alcohol use: No    Alcohol/week: 0.0 standard drinks  . Drug use: No  . Sexual activity: Not Currently    Birth control/protection: Surgical  Lifestyle  . Physical activity    Days per week: Not on file    Minutes per session: Not on file  . Stress: Not on file  Relationships  . Social Herbalist on phone: Not on file    Gets together: Not on file    Attends religious service: Not on file    Active member of club or organization: Not on file    Attends meetings of clubs or organizations: Not on file    Relationship status: Not on file  . Intimate partner violence    Fear of current or ex partner: Not on file    Emotionally abused: Not on file    Physically abused: Not on file    Forced sexual activity: Not on file  Other Topics Concern  . Not on file  Social History Narrative  . Not on file    Family History  Problem Relation Age of Onset  . Coronary artery disease Father   . Diabetes Father   . Diabetes Mother   . Breast cancer Neg Hx   . Colon cancer Neg Hx         Objective:  Physical Exam: BP 122/68   Ht 5' 2"  (1.575 m)   BMI 24.34 kg/m  Gen: NAD, comfortable in exam room Lungs: Breathing comfortably on room air Hand/wrist Exam Left -Inspection: No deformity, no discoloration -Palpation: Tenderness palpation over the first dorsal compartment -ROM: Normal range of motion with flexion, extension, radial deviation, ulnar deviation, pronation, supination.  Patient unable to move the thumb in any direction without significant pain -Strength: Flexion: 5/5; Extension: 5/5; Radial Deviation: 5/5; Ulnar Deviation: 5/5 -Special Tests: Tinels: Negative; Phalens: Negative; Finkelstein's: Positive -Limb neurovascularly intact  Contralateral Hand/wrist -Inspection: No deformity, no discoloration -Palpation: distal radius: non-tender; Distal ulna: non-tender; scaphoid: non-tender -ROM: Normal range of motion with  flexion, extension, radial deviation, ulnar deviation, pronation, supination -Strength: Flexion: 5/5; Extension: 5/5; Radial Deviation: 5/5; Ulnar Deviation: 5/5 -Limb neurovascularly intact   Limited diagnostic ultrasound of the left wrist Findings: -Fluid noticed within the tendon sheath of the first dorsal compartment.  Normal appearance of tendons of the first dorsal compartment -Normal appearance of the tendons of the second dorsal compartment Impression: -Ultrasound findings consistent with de Quervain's tenosynovitis    Assessment & Plan:  Patient is a 53 y.o. female here for evaluation of left wrist pain  1.  Left de Quervain's tenosynovitis -Ultrasound showing fluid within the first dorsal compartment  tendon sheath -Consent was obtained for corticosteroid injection of the first dorsal compartment tendon sheath.  Timeout was performed prior to the injection.  20 mg of Depo-Medrol and half cc of lidocaine were injected using ultrasound guidance and sterile technique.  Patient tolerated the injection well there were no complications -Patient was given a thumb spica wrist brace -Prescription for meloxicam was sent to the pharmacy.  Patient was instructed not to take other NSAIDs with this.  She may continue to take Tylenol as needed.  Patient will follow up in 3 to 4 weeks  Addendum:  Patient seen in the office by fellow.  His history, exam, plan of care were precepted with me.  Karlton Lemon MD Kirt Boys

## 2019-03-21 ENCOUNTER — Other Ambulatory Visit: Payer: Self-pay

## 2019-03-21 ENCOUNTER — Telehealth: Payer: Self-pay

## 2019-03-21 MED ORDER — TRAMADOL HCL 50 MG PO TABS: 50.0000 mg | ORAL_TABLET | Freq: Three times a day (TID) | ORAL | 0 refills | Status: DC | PRN

## 2019-03-21 NOTE — Telephone Encounter (Signed)
Per Dr. Sheppard Coil she will d/c the meloxicam and we will call in Tramadol 50 mg TID PRN. Per patient she has taken this in the past without any issues. I reminded her that the injection will take a few days to have an effect. She has been icing and using topical creams as well so I advised her to continue all of that. If she isn't getting any relief by next week she will let us know.

## 2019-03-27 ENCOUNTER — Telehealth: Payer: Self-pay | Admitting: Sports Medicine

## 2019-03-27 ENCOUNTER — Other Ambulatory Visit: Payer: Self-pay

## 2019-03-27 DIAGNOSIS — M25532 Pain in left wrist: Secondary | ICD-10-CM

## 2019-03-27 NOTE — Telephone Encounter (Signed)
I called patient back to return her phone call regarding ongoing pain in her wrist.  Last week patient called after her appointment stating that her pain had worsened and her wrist following her visit.  She was given a short course of tramadol and given a few more days to see if the steroid to have an effect.  Patient called back today stating that she has had no improvement in her wrist pain.  She has continued to use a thumb spica wrist brace as well as activity modification.  Patient also notes severe nighttime symptoms that will wake her up in the middle the night.  Given her ongoing symptoms as well as lack of improvement with conservative management I will proceed with getting an MRI of her left wrist for further evaluation.  Patient will be called to schedule this and she will follow up after her MRI to discuss the results. Inez Catalina, MD

## 2019-03-29 ENCOUNTER — Ambulatory Visit: Payer: BC Managed Care – PPO

## 2019-04-03 ENCOUNTER — Telehealth: Payer: Self-pay

## 2019-04-03 ENCOUNTER — Other Ambulatory Visit: Payer: Self-pay

## 2019-04-03 DIAGNOSIS — M25532 Pain in left wrist: Secondary | ICD-10-CM

## 2019-04-03 NOTE — Telephone Encounter (Signed)
[employer] requires x-rays be done before getting an MRI. I have put the order in for her to go to [place] at her earliest convenience. Once we have the results we will send them to [facility] to see if they'll approve the MRI. Instructed pt to call the office if she had any questions.

## 2019-04-08 ENCOUNTER — Ambulatory Visit
Admission: RE | Admit: 2019-04-08 | Discharge: 2019-04-08 | Disposition: A | Discharge status: Home / Self Care | Payer: BC Managed Care – PPO | Source: Ambulatory Visit | Attending: Family Medicine | Admitting: Family Medicine

## 2019-04-08 ENCOUNTER — Other Ambulatory Visit: Payer: Self-pay

## 2019-04-08 DIAGNOSIS — E119 Type 2 diabetes mellitus without complications: Secondary | ICD-10-CM | POA: Diagnosis not present

## 2019-04-08 DIAGNOSIS — Z79899 Other long term (current) drug therapy: Secondary | ICD-10-CM | POA: Diagnosis not present

## 2019-04-08 DIAGNOSIS — E781 Pure hyperglyceridemia: Secondary | ICD-10-CM | POA: Diagnosis not present

## 2019-04-08 DIAGNOSIS — M25532 Pain in left wrist: Secondary | ICD-10-CM

## 2019-04-10 DIAGNOSIS — E781 Pure hyperglyceridemia: Secondary | ICD-10-CM | POA: Diagnosis not present

## 2019-04-10 DIAGNOSIS — Z79899 Other long term (current) drug therapy: Secondary | ICD-10-CM | POA: Diagnosis not present

## 2019-04-10 DIAGNOSIS — E119 Type 2 diabetes mellitus without complications: Secondary | ICD-10-CM | POA: Diagnosis not present

## 2019-04-14 ENCOUNTER — Other Ambulatory Visit: Payer: BC Managed Care – PPO

## 2019-04-17 ENCOUNTER — Ambulatory Visit: Payer: BC Managed Care – PPO | Admitting: Sports Medicine

## 2019-04-19 ENCOUNTER — Ambulatory Visit: Payer: BC Managed Care – PPO | Admitting: Sports Medicine

## 2019-04-26 ENCOUNTER — Other Ambulatory Visit: Payer: Self-pay | Admitting: Sports Medicine

## 2019-05-08 ENCOUNTER — Ambulatory Visit: Payer: BC Managed Care – PPO

## 2019-05-16 ENCOUNTER — Ambulatory Visit: Payer: Self-pay | Admitting: Podiatry

## 2019-05-22 ENCOUNTER — Encounter: Payer: Self-pay | Admitting: Internal Medicine

## 2019-05-22 ENCOUNTER — Other Ambulatory Visit: Payer: Self-pay

## 2019-05-22 ENCOUNTER — Ambulatory Visit (INDEPENDENT_AMBULATORY_CARE_PROVIDER_SITE_OTHER): Payer: BC Managed Care – PPO | Admitting: Internal Medicine

## 2019-05-22 DIAGNOSIS — J4540 Moderate persistent asthma, uncomplicated: Secondary | ICD-10-CM | POA: Diagnosis not present

## 2019-05-22 DIAGNOSIS — G2581 Restless legs syndrome: Secondary | ICD-10-CM

## 2019-05-22 DIAGNOSIS — K219 Gastro-esophageal reflux disease without esophagitis: Secondary | ICD-10-CM | POA: Diagnosis not present

## 2019-05-22 DIAGNOSIS — J4520 Mild intermittent asthma, uncomplicated: Secondary | ICD-10-CM

## 2019-05-22 MED ORDER — MONTELUKAST SODIUM 10 MG PO TABS: 10.0000 mg | ORAL_TABLET | Freq: Every day | ORAL | 3 refills | Status: DC

## 2019-05-22 MED ORDER — ALBUTEROL SULFATE HFA 108 (90 BASE) MCG/ACT IN AERS: 2.0000 | INHALATION_SPRAY | Freq: Four times a day (QID) | RESPIRATORY_TRACT | 12 refills | Status: DC | PRN

## 2019-05-22 MED ORDER — TRELEGY ELLIPTA 100-62.5-25 MCG/INH IN AEPB: 1.0000 | INHALATION_SPRAY | Freq: Every day | RESPIRATORY_TRACT | 0 refills | Status: DC

## 2019-05-22 NOTE — Assessment & Plan Note (Signed)
Emphasize reflux precautions and discuss with PCP. She says Pepcid not working well enough.

## 2019-05-22 NOTE — Assessment & Plan Note (Signed)
Current control not good. Watch for symptom overlap with GERD. May need home nebulizer. Next visit should be in-person.I will see her sooner if 3 months too long and not improving. Plan- try SS Telegy 100 instead of Breo., refill albuterol hfa and singulair

## 2019-05-22 NOTE — Patient Instructions (Signed)
Sample x 2 Trelegy 100 inhaler   1 puff, then rinse mouth, once daily Try this for 2 weeks instead of Breo. If you think it works better than Breo, let me know and we can send a prescription.  Refill scripts sent for Singulair and albuterol rescue inhaler  If you aren't doing better, please let us know and I can see you sooner.

## 2019-05-22 NOTE — Assessment & Plan Note (Signed)
Not managed here- not sure why she has both requip and mirapex on her list

## 2019-05-22 NOTE — Progress Notes (Signed)
Subjective:     Patient ID: Brooke Floyd, female   DOB: [birth year], 54 y.o.   MRN: [id number]  HPI  female never smoker followed for asthma complicated by GERD, DM, GERD, HBP, restless legs PFT 04/26/17-Mild to moderate obstructive airways disease with significant response to bronchodilator, mild restriction of total lung capacity, mild reduction of diffusion.  FVC 1.89/71%, FEV1 1.70/80%, ratio 0.90, TLC 71%, DLCO 64%. ---------------------------------------------------------------------------------------------------  04/26/17- 54 year old female never smoker followed for asthma complicated by GERD, DM, GERD, HBP, restless legs ---Asthma; Pt had PFT today. Pt would like to have Spacer to use with inhalers as well.  Singulair, albuterol HFA She has not been using Dulera because she had not felt she needed it.  Using rescue inhaler about 3 times a week.  Notices cough and some sleep disturbance. Hoarse for the past week.  Does not recognize an obvious cold. Admits "bad" reflux despite PPI and asks GI referral.  Has adjustable bed but has not been elevating head. Also on an ACEI discussed possible dry cough from this. PFT 04/26/17-Mild to moderate obstructive airways disease with significant response to bronchodilator, mild restriction of total lung capacity, mild reduction of diffusion.  FVC 1.89/71%, FEV1 1.70/80%, ratio 0.90, TLC 71%, DLCO 64%. CXR 12/21/16 IMPRESSION: No active cardiopulmonary disease.  05/22/19- Virtual Visit via Telephone Note  I connected with Brooke Floyd on 05/22/19 at  9:30 AM EST by telephone and verified that I am speaking with the correct person using two identifiers.  Location: Patient: H Provider: O   I discussed the limitations, risks, security and privacy concerns of performing an evaluation and management service by telephone and the availability of in person appointments. I also discussed with the patient that there may be a patient responsible  charge related to this service. The patient expressed understanding and agreed to proceed.   History of Present Illness: 54 year old female never smoker followed for Asthma complicated by GERD, DM, GERD, HBP, restless legs ropinirole 1 mg, Mirapex 0.75 mg, Singulair, Breo 100, albuterol hfa Note- ACEI quinapril She has noted more asthma over past year- unclear why. Out of singulair and feels that helps. Waking at night smothered, needing to use rescue inhaler.  Does have GERD- discussed possibility of reflux to watch for and discuss with PCP. Helps to sleep with adjustable bed head raised, but husband doesn't like that. They are looking into dual/ separate control adjustable mattress.    Observations/Objective: CXR 12/21/16-  No active cardiopulmonary disease.  Assessment and Plan: Asthma moderate persistent not well controlled- try samples Trelegy. Consider Neb. GERD- not well controlled- she will discuss w PCP  Follow Up Instructions: 3 months   I discussed the assessment and treatment plan with the patient. The patient was provided an opportunity to ask questions and all were answered. The patient agreed with the plan and demonstrated an understanding of the instructions.   The patient was advised to call back or seek an in-person evaluation if the symptoms worsen or if the condition fails to improve as anticipated.  I provided 21 minutes of non-face-to-face time during this encounter.   Baird Lyons, MD    ROS-see HPI   + = positive Constitutional:   No-   weight loss, night sweats, fevers, chills, fatigue, lassitude.  HEENT:   No-  headaches, difficulty swallowing, tooth/dental problems, sore throat,       No-  sneezing, itching, ear ache,+ nasal congestion, post nasal drip,  CV:  No-   chest pain, orthopnea,  PND, swelling in lower extremities, anasarca, dizziness, palpitations Resp: +   shortness of breath with exertion or at rest.              No-   productive cough,  +non-productive cough,  No- coughing up of blood.              No-   change in color of mucus.  + wheezing.   Skin: No-   rash or lesions. GI:  No-   heartburn, indigestion, abdominal pain, nausea, vomiting,  GU:  MS:  No-   joint pain or swelling.  . Neuro-     nothing unusual Psych:  No- change in mood or affect. No depression or anxiety.  No memory loss.  OBJ- Physical Exam General- Alert, Oriented, Affect-appropriate, Distress- none acute. Overweight Skin- rash-none, lesions- none, excoriation- none Lymphadenopathy- tender L axilla c/w hydradinitis Head- atraumatic            Eyes- Gross vision intact, PERRLA, conjunctivae and secretions clear            Ears- Hearing, canals-normal            Nose- Clear, no-Septal dev, mucus, polyps, erosion, perforation             Throat- Mallampati III , mucosa+ lightly coated, drainage- none, tonsils- atrophic + hoarse Neck- flexible , trachea midline, no stridor , thyroid nl, carotid no bruit Chest - symmetrical excursion , unlabored           Heart/CV- RRR , no murmur , no gallop  , no rub, nl s1 s2                           - JVD- none , edema- none, stasis changes- none, varices- none           Lung- clear, wheeze- none, cough- none , dullness-none, rub- none, unlabored           Chest wall-  Abd-  Br/ Gen/ Rectal- Not done, not indicated Extrem- cyanosis- none, clubbing, none, atrophy- none, strength- nl Neuro- grossly intact to observation

## 2019-05-28 ENCOUNTER — Encounter: Payer: Self-pay | Admitting: Podiatry

## 2019-05-28 ENCOUNTER — Ambulatory Visit (INDEPENDENT_AMBULATORY_CARE_PROVIDER_SITE_OTHER): Payer: BC Managed Care – PPO

## 2019-05-28 ENCOUNTER — Other Ambulatory Visit: Payer: Self-pay

## 2019-05-28 ENCOUNTER — Ambulatory Visit: Payer: BC Managed Care – PPO | Admitting: Podiatry

## 2019-05-28 DIAGNOSIS — M79672 Pain in left foot: Secondary | ICD-10-CM

## 2019-05-28 DIAGNOSIS — M779 Enthesopathy, unspecified: Secondary | ICD-10-CM | POA: Diagnosis not present

## 2019-05-28 MED ORDER — MUPIROCIN 2 % EX OINT: 1.0000 "application " | TOPICAL_OINTMENT | Freq: Two times a day (BID) | CUTANEOUS | 2 refills | Status: DC

## 2019-05-28 NOTE — Patient Instructions (Signed)

## 2019-05-29 ENCOUNTER — Other Ambulatory Visit: Payer: Self-pay | Admitting: Podiatry

## 2019-05-29 DIAGNOSIS — M779 Enthesopathy, unspecified: Secondary | ICD-10-CM

## 2019-06-03 NOTE — Progress Notes (Signed)
Subjective:   Patient ID: [name], female   DOB: 54 y.o.   MRN: [id number]   HPI 54 year old female presents the office today for concerns of pain to the left big toe at the tip of the toe.  She states that she may have hit her toe but she is not sure when but the toenail is painful with pressure in shoes.  She denies any redness or drainage or any swelling.  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  A1c that I can see in the computer was 10 on February 07, 2018.  She states that she has drastically changed her diet and activity level and working on her sugar.   Review of Systems  All other systems reviewed and are negative.  Past Medical History:  Diagnosis Date  . Asthma   . GERD (gastroesophageal reflux disease)   . Hyperlipidemia   . Hypertension   . Thrombocytosis ([place]) 02/08/2017  . Type 2 diabetes mellitus (HCC)    insulin dependent    Past Surgical History:  Procedure Laterality Date  . COMBINED HYSTERECTOMY ABDOMINAL W/ A&P REPAIR / OOPHORECTOMY    . WRIST ARTHROSCOPY       Current Outpatient Medications:  .  albuterol (VENTOLIN HFA) 108 (90 Base) MCG/ACT inhaler, Inhale 2 puffs into the lungs every 6 (six) hours as needed for wheezing or shortness of breath., Disp: 18 g, Rfl: 12 .  Blood Glucose Monitoring Suppl (FREESTYLE FREEDOM LITE) w/Device KIT, check blood sugar up to 3 times a day, Disp: 1 each, Rfl: 1 .  fluticasone furoate-vilanterol (BREO ELLIPTA) 100-25 MCG/INH AEPB, Inhale 1 puff into the lungs daily., Disp: 60 each, Rfl: 5 .  Fluticasone-Umeclidin-Vilant (TRELEGY ELLIPTA) 100-62.5-25 MCG/INH AEPB, Inhale 1 puff into the lungs daily., Disp: 2 each, Rfl: 0 .  gabapentin (NEURONTIN) 300 MG capsule, Take 3 capsules 2 hours before bedtime; may take 1 capsule as needed during the day., Disp: 120 capsule, Rfl: 5 .  glucose blood (FREESTYLE LITE) test strip, Test blood sugar 3 times a day,  Disp: 100 each, Rfl: 12 .  hydrochlorothiazide (MICROZIDE) 12.5 MG capsule, Take 1 capsule (12.5 mg total) by mouth daily., Disp: 90 capsule, Rfl: 3 .  imipramine (TOFRANIL) 25 MG tablet, Take 25 mg by mouth at bedtime., Disp: , Rfl:  .  Insulin Glargine (BASAGLAR KWIKPEN) 100 UNIT/ML SOPN, Inject 0.18 mLs (18 Units total) into the skin at bedtime., Disp: 15 mL, Rfl: 2 .  Insulin Pen Needle (PEN NEEDLES 3/16") 31G X 5 MM MISC, Use to inject Lantus at bedtime. The patient is insulin requiring, ICD 10 code E11.65. The patient injects 1 times per day., Disp: 100 each, Rfl: 6 .  montelukast (SINGULAIR) 10 MG tablet, Take 1 tablet (10 mg total) by mouth at bedtime., Disp: 90 tablet, Rfl: 3 .  quinapril (ACCUPRIL) 40 MG tablet, TAKE ONE TABLET BY MOUTH DAILY, Disp: 90 tablet, Rfl: 3 .  rOPINIRole (REQUIP) 1 MG tablet, Take 1 mg by mouth at bedtime., Disp: , Rfl:  .  simvastatin (ZOCOR) 80 MG tablet, TAKE 1 TABLET BY MOUTH BEFORE BEDTIME, Disp: 90 tablet, Rfl: 3 .  SYNJARDY 12.09-998 MG TABS, TAKE 1 TABLET BY MOUTH 2 (TWO) TIMES DAILY WITH A MEAL., Disp: 180 tablet, Rfl: 0 .  traMADol (ULTRAM) 50 MG tablet, Take 1 tablet (50 mg total) by mouth every 8 (eight) hours as needed., Disp: 12 tablet, Rfl:  0 .  traZODone (DESYREL) 100 MG tablet, Take 100 mg by mouth at bedtime as needed., Disp: , Rfl:  .  mupirocin ointment (BACTROBAN) 2 %, Apply 1 application topically 2 (two) times daily., Disp: 30 g, Rfl: 2  Allergies  Allergen Reactions  . Diclofenac Sodium Itching  . Hydrocodone Itching  . Penicillins Other (See Comments)    convulsions  . Voltaren [Diclofenac] Itching  . Codeine Itching         Objective:  Physical Exam  General: AAO x3, NAD  Dermatological: Incurvation present to the medial aspect of the left hallux toenail and is there is tenderness to the very distal portion of the nail.  There is no edema, erythema, drainage or pus there is no clinical signs of infection noted today.  No  pain to the base of the nail.  The nail is mildly hypertrophic, dystrophic.  There is no open lesions.  Vascular: Dorsalis Pedis artery and Posterior Tibial artery pedal pulses are 2/4 bilateral with immedate capillary fill time.  There is no pain with calf compression, swelling, warmth, erythema.   Neruologic: Grossly intact via light touch bilateral.   Musculoskeletal: No gross boney pedal deformities bilateral. No pain, crepitus, or limitation noted with foot and ankle range of motion bilateral. Muscular strength 5/5 in all groups tested bilateral.  Gait: Unassisted, Nonantalgic.       Assessment:   Ingrown toenail    Plan:  -Treatment options discussed including all alternatives, risks, and complications -Etiology of symptoms were discussed -We discussed multiple treatment options.  After discussion I sharply debrided the symptomatic portion of the ingrown toenail with any complications or bleeding.  Recommended Epson salt soaks and I prescribed mupirocin ointment to apply daily.  Monitor for any signs or symptoms of infection.  Should symptoms continue she did have a partial nail avulsion.  I dispensed offloading pads to avoid any pressure to the toenail given her wearing shoes and compression socks.  I have offered for her to follow-up with Dr. Paulla Dolly as she is known to him  Trula Slade DPM

## 2019-06-12 DIAGNOSIS — E113591 Type 2 diabetes mellitus with proliferative diabetic retinopathy without macular edema, right eye: Secondary | ICD-10-CM | POA: Diagnosis not present

## 2019-06-12 DIAGNOSIS — H3582 Retinal ischemia: Secondary | ICD-10-CM | POA: Diagnosis not present

## 2019-06-12 DIAGNOSIS — E113522 Type 2 diabetes mellitus with proliferative diabetic retinopathy with traction retinal detachment involving the macula, left eye: Secondary | ICD-10-CM | POA: Diagnosis not present

## 2019-06-12 DIAGNOSIS — H35033 Hypertensive retinopathy, bilateral: Secondary | ICD-10-CM | POA: Diagnosis not present

## 2019-06-25 ENCOUNTER — Ambulatory Visit: Payer: BC Managed Care – PPO | Admitting: Podiatry

## 2019-06-25 ENCOUNTER — Other Ambulatory Visit: Payer: Self-pay

## 2019-06-25 DIAGNOSIS — M79674 Pain in right toe(s): Secondary | ICD-10-CM

## 2019-06-25 DIAGNOSIS — L600 Ingrowing nail: Secondary | ICD-10-CM

## 2019-06-25 DIAGNOSIS — M79675 Pain in left toe(s): Secondary | ICD-10-CM

## 2019-06-25 MED ORDER — SULFAMETHOXAZOLE-TRIMETHOPRIM 800-160 MG PO TABS: 1.0000 | ORAL_TABLET | Freq: Two times a day (BID) | ORAL | 0 refills | Status: DC

## 2019-06-25 NOTE — Patient Instructions (Signed)

## 2019-06-26 DIAGNOSIS — E113522 Type 2 diabetes mellitus with proliferative diabetic retinopathy with traction retinal detachment involving the macula, left eye: Secondary | ICD-10-CM | POA: Diagnosis not present

## 2019-06-26 DIAGNOSIS — H4312 Vitreous hemorrhage, left eye: Secondary | ICD-10-CM | POA: Diagnosis not present

## 2019-07-01 NOTE — Progress Notes (Signed)
Subjective: 54 year old female presents the office today for follow-up evaluation of toe pain ingrown toenail left big toe.  She states that both of her big toes on the medial aspect that she points to her painful particular shoes.  She does a lot of walking every day for her job and this seems to aggravate her symptoms.  Currently denies any redness or drainage or any swelling.  She states her last A1c was 7.2 about 6 months ago but she feels that her blood sugars improved as she has changed her diet. Denies any systemic complaints such as fevers, chills, nausea, vomiting. No acute changes since last appointment, and no other complaints at this time.   Objective: AAO x3, NAD DP/PT pulses palpable bilaterally, CRT less than 3 seconds Incurvation present to the medial aspect of bilateral hallux toenails with tenderness palpation.  The nails are mildly hypertrophic, dystrophic with yellow-brown discoloration.  No drainage or pus.  No open lesions or pre-ulcerative lesions.  No pain with calf compression, swelling, warmth, erythema  Assessment: Bilateral ingrown toenails  Plan: -All treatment options discussed with the patient including all alternatives, risks, complications.  -At this time, the patient is requesting partial nail removal with chemical matricectomy to the symptomatic portion of the nail. Risks and complications were discussed with the patient for which they understand and written consent was obtained. Under sterile conditions a total of 3 mL of a mixture of 2% lidocaine plain and 0.5% Marcaine plain was infiltrated in a hallux block fashion. Once anesthetized, the skin was prepped in sterile fashion. A tourniquet was then applied. Next the medial aspect of hallux nail border was then sharply excised making sure to remove the entire offending nail border. Once the nails were ensured to be removed area was debrided and the underlying skin was intact. There is no purulence identified in the  procedure. Next phenol was then applied under standard conditions and copiously irrigated. Silvadene was applied. A dry sterile dressing was applied. After application of the dressing the tourniquet was removed and there is found to be an immediate capillary refill time to the digit. The patient tolerated the procedure well any complications. Post procedure instructions were discussed the patient for which he verbally understood. Follow-up in one week for nail check or sooner if any problems are to arise. Discussed signs/symptoms of infection and directed to call the office immediately should any occur or go directly to the emergency room. In the meantime, encouraged to call the office with any questions, concerns, changes symptoms. -Bactrim -Patient encouraged to call the office with any questions, concerns, change in symptoms.   Return in about 1 week (around 07/02/2019) for nail check.  Trula Slade DPM

## 2019-07-02 ENCOUNTER — Encounter: Payer: Self-pay | Admitting: Podiatry

## 2019-07-02 ENCOUNTER — Other Ambulatory Visit: Payer: Self-pay

## 2019-07-02 ENCOUNTER — Ambulatory Visit (INDEPENDENT_AMBULATORY_CARE_PROVIDER_SITE_OTHER): Payer: BC Managed Care – PPO | Admitting: Podiatry

## 2019-07-02 DIAGNOSIS — L600 Ingrowing nail: Secondary | ICD-10-CM

## 2019-07-02 NOTE — Patient Instructions (Signed)

## 2019-07-08 DIAGNOSIS — L600 Ingrowing nail: Secondary | ICD-10-CM | POA: Insufficient documentation

## 2019-07-08 NOTE — Progress Notes (Signed)
Subjective: Brooke Floyd is a 54 y.o.  female returns to office today for follow up evaluation after having bilateral Hallux partial nail avulsion performed. Patient has been soaking using epsom salts and applying topical antibiotic covered with bandaid daily.  Still having some tenderness but overall she states that it is getting better.  She said some dried blood and scabbing that she is concerned about.  Patient denies fevers, chills, nausea, vomiting. Denies any calf pain, chest pain, SOB.   Objective:  Vitals: Reviewed  General: Well developed, nourished, in no acute distress, alert and oriented x3   Dermatology: Skin is warm, dry and supple bilateral. Bilateral hallux nail border appears to be clean, dry, with mild granular tissue and surrounding scab. There is no surrounding erythema, edema, drainage/purulence. There are no other lesions or other signs of infection present.  Neurovascular status: Intact. No lower extremity swelling; No pain with calf compression bilateral.  Musculoskeletal: Decreased tenderness to palpation of the bilateral hallux nail folds. Muscular strength within normal limits bilateral.   Assesement and Plan: S/p partial nail avulsion, doing well.   -Continue soaking in epsom salts twice a day followed by antibiotic ointment and a band-aid. Can leave uncovered at night. Continue this until completely healed.  -Discussed at this time the scabbing is normal. -If the area has not healed in 2 weeks, call the office for follow-up appointment, or sooner if any problems arise.  -Monitor for any signs/symptoms of infection. Call the office immediately if any occur or go directly to the emergency room. Call with any questions/concerns.  Celesta Gentile, DPM

## 2019-07-17 DIAGNOSIS — E113522 Type 2 diabetes mellitus with proliferative diabetic retinopathy with traction retinal detachment involving the macula, left eye: Secondary | ICD-10-CM | POA: Diagnosis not present

## 2019-07-17 DIAGNOSIS — E113591 Type 2 diabetes mellitus with proliferative diabetic retinopathy without macular edema, right eye: Secondary | ICD-10-CM | POA: Diagnosis not present

## 2019-07-18 ENCOUNTER — Telehealth: Payer: Self-pay | Admitting: Internal Medicine

## 2019-07-18 MED ORDER — TRELEGY ELLIPTA 100-62.5-25 MCG/INH IN AEPB: 1.0000 | INHALATION_SPRAY | Freq: Every day | RESPIRATORY_TRACT | 2 refills | Status: DC

## 2019-07-18 NOTE — Telephone Encounter (Signed)
Called and spoke with Patient.  Patient stated she needed a refill for Trelegy.  Patient stated Trelegy samples  seemed to help more then [facility], and she wasn't using her emergency inhaler as much. [facility]  Nothing further at this time.

## 2019-07-18 NOTE — Telephone Encounter (Signed)
Pt returning a phone call. Pt can be reached at [phone].

## 2019-07-18 NOTE — Telephone Encounter (Signed)
Patient last seen 1.31.2021 by Dr Annamaria Boots and Trelegy was provided to try:  Assessment and Plan: Asthma moderate persistent not well controlled- try samples Trelegy. Consider Neb. GERD- not well controlled- she will discuss w PCP   Follow Up Instructions: 3 months   I discussed the assessment and treatment plan with the patient. The patient was provided an opportunity to ask questions and all were answered. The patient agreed with the plan and demonstrated an understanding of the instructions.   The patient was advised to call back or seek an in-person evaluation if the symptoms worsen or if the condition fails to improve as anticipated.   LMOM TCB x1 to discuss if the Trelegy worked for her vs going back to the [employer].

## 2019-08-01 DIAGNOSIS — H2513 Age-related nuclear cataract, bilateral: Secondary | ICD-10-CM | POA: Diagnosis not present

## 2019-08-01 DIAGNOSIS — E113522 Type 2 diabetes mellitus with proliferative diabetic retinopathy with traction retinal detachment involving the macula, left eye: Secondary | ICD-10-CM | POA: Diagnosis not present

## 2019-08-01 DIAGNOSIS — H3342 Traction detachment of retina, left eye: Secondary | ICD-10-CM | POA: Diagnosis not present

## 2019-08-01 DIAGNOSIS — E113511 Type 2 diabetes mellitus with proliferative diabetic retinopathy with macular edema, right eye: Secondary | ICD-10-CM | POA: Diagnosis not present

## 2019-08-06 DIAGNOSIS — H18413 Arcus senilis, bilateral: Secondary | ICD-10-CM | POA: Diagnosis not present

## 2019-08-06 DIAGNOSIS — H2512 Age-related nuclear cataract, left eye: Secondary | ICD-10-CM | POA: Diagnosis not present

## 2019-08-06 DIAGNOSIS — H25043 Posterior subcapsular polar age-related cataract, bilateral: Secondary | ICD-10-CM | POA: Diagnosis not present

## 2019-08-06 DIAGNOSIS — H2513 Age-related nuclear cataract, bilateral: Secondary | ICD-10-CM | POA: Diagnosis not present

## 2019-08-06 DIAGNOSIS — E113493 Type 2 diabetes mellitus with severe nonproliferative diabetic retinopathy without macular edema, bilateral: Secondary | ICD-10-CM | POA: Diagnosis not present

## 2019-08-14 ENCOUNTER — Emergency Department (HOSPITAL_COMMUNITY)
Admission: EM | Admit: 2019-08-14 | Discharge: 2019-08-14 | Discharge status: Against Medical Advice | Payer: BC Managed Care – PPO | Attending: Emergency Medicine | Admitting: Emergency Medicine

## 2019-08-14 DIAGNOSIS — R404 Transient alteration of awareness: Secondary | ICD-10-CM | POA: Diagnosis not present

## 2019-08-14 DIAGNOSIS — Z5320 Procedure and treatment not carried out because of patient's decision for unspecified reasons: Secondary | ICD-10-CM | POA: Diagnosis not present

## 2019-08-14 DIAGNOSIS — Z794 Long term (current) use of insulin: Secondary | ICD-10-CM | POA: Insufficient documentation

## 2019-08-14 DIAGNOSIS — E119 Type 2 diabetes mellitus without complications: Secondary | ICD-10-CM | POA: Diagnosis not present

## 2019-08-14 DIAGNOSIS — R4182 Altered mental status, unspecified: Secondary | ICD-10-CM | POA: Diagnosis not present

## 2019-08-14 DIAGNOSIS — I10 Essential (primary) hypertension: Secondary | ICD-10-CM | POA: Diagnosis not present

## 2019-08-14 DIAGNOSIS — R410 Disorientation, unspecified: Secondary | ICD-10-CM | POA: Diagnosis not present

## 2019-08-14 LAB — CBG MONITORING, ED: Glucose-Capillary: 154 mg/dL — ABNORMAL HIGH (ref 70–99)

## 2019-08-14 NOTE — ED Triage Notes (Signed)
Pt BIB [facility] from eye doctor following abrupt behavior change. Pt Hx of DM. Hypertensive at 177/75.

## 2019-08-14 NOTE — ED Notes (Signed)
Pt complaining that she was brought in against her will and that she doesn't want to be here. She says she doesn't understand why she was brought in. She says that she couldn't walk at the eye doctor, but then was able to stand and now she wants to go. Says they thought her CBG was low bc she is diabetic and they gave her candy and sweet tea at the eye doctor and CBG was "90-something" when EMS arrived. Says she will walk home or have her husband pick her up at 5:00. Says she is tired and has numerous stressors. MD made aware.

## 2019-08-14 NOTE — ED Notes (Signed)
CBG of 154

## 2019-08-14 NOTE — ED Provider Notes (Signed)
[place] EMERGENCY DEPARTMENT Provider Note   CSN: [id number] Arrival date & time: 08/14/19  1526     History Chief Complaint  Patient presents with  . Altered Mental Status    Brooke Floyd is a 54 y.o. female.  HPI   According to the EMS report, the pt was at the eye doctors appointment.  She had an abrupt change in her behavior.  Pt states she remembers feeling  like her sugar was low.  She ended up eating some candy.  She knows she was at the doctors office but she does not know who called the ambulance or specifically why. Pt states she feels fine now and does not want to be here.  She keeps asking me if she is allowed to go.  She denies headache or chest pain.  She denies abdominal pain.  She denies any numbness or weakness.  Patient had a blood sugar checked by paramedics and it was in the 90s.  Past Medical History:  Diagnosis Date  . Asthma   . GERD (gastroesophageal reflux disease)   . Hyperlipidemia   . Hypertension   . Thrombocytosis ([place]) 02/08/2017  . Type 2 diabetes mellitus (HCC)    insulin dependent    Patient Active Problem List   Diagnosis Date Noted  . Ingrown toenail 07/08/2019  . Adhesive capsulitis of left shoulder 07/04/2018  . Mixed stress and urge urinary incontinence 05/16/2018  . Thrombocytosis ([place]) 02/08/2017  . Abnormal vaginal bleeding 01/11/2017  . Hot flashes 02/19/2016  . Depression 09/29/2015  . Restless leg syndrome 06/29/2015  . Murmur 08/29/2013  . Preventative health care 01/02/2013  . HTN (hypertension) 06/14/2012  . Hyperlipidemia 06/14/2012  . Asthma, moderate persistent 02/17/2009  . GERD 02/17/2009  . Diabetes mellitus without complication ([place]) 14/48/1856    Past Surgical History:  Procedure Laterality Date  . COMBINED HYSTERECTOMY ABDOMINAL W/ A&P REPAIR / OOPHORECTOMY    . WRIST ARTHROSCOPY       OB History    Gravida  2   Para  2   Term  2   Preterm      AB      Living  2     SAB      TAB      Ectopic      Multiple      Live Births              Family History  Problem Relation Age of Onset  . Coronary artery disease Father   . Diabetes Father   . Diabetes Mother   . Breast cancer Neg Hx   . Colon cancer Neg Hx     Social History   Tobacco Use  . Smoking status: Never Smoker  . Smokeless tobacco: Never Used  Substance Use Topics  . Alcohol use: No    Alcohol/week: 0.0 standard drinks  . Drug use: No    Home Medications Prior to Admission medications   Medication Sig Start Date End Date Taking? Authorizing Provider  albuterol (VENTOLIN HFA) 108 (90 Base) MCG/ACT inhaler Inhale 2 puffs into the lungs every 6 (six) hours as needed for wheezing or shortness of breath. 05/22/19   Baird Lyons D, MD  Blood Glucose Monitoring Suppl (FREESTYLE FREEDOM LITE) w/Device KIT check blood sugar up to 3 times a day 09/05/18   Alphonzo Grieve, MD  fluticasone furoate-vilanterol (BREO ELLIPTA) 100-25 MCG/INH AEPB Inhale 1 puff into the lungs daily. 08/01/18   Alphonzo Grieve,  MD  Fluticasone-Umeclidin-Vilant (TRELEGY ELLIPTA) 100-62.5-25 MCG/INH AEPB Inhale 1 puff into the lungs daily. 07/18/19   Deneise Lever, MD  gabapentin (NEURONTIN) 300 MG capsule Take 3 capsules 2 hours before bedtime; may take 1 capsule as needed during the day. 08/01/18   Alphonzo Grieve, MD  glucose blood (FREESTYLE LITE) test strip Test blood sugar 3 times a day 08/29/18   Alphonzo Grieve, MD  hydrochlorothiazide (MICROZIDE) 12.5 MG capsule Take 1 capsule (12.5 mg total) by mouth daily. 08/01/18   Alphonzo Grieve, MD  imipramine (TOFRANIL) 25 MG tablet Take 25 mg by mouth at bedtime. 05/15/19   [provider]  Insulin Glargine (BASAGLAR KWIKPEN) 100 UNIT/ML SOPN Inject 0.18 mLs (18 Units total) into the skin at bedtime. 08/24/18   Alphonzo Grieve, MD  Insulin Pen Needle (PEN NEEDLES 3/16") 31G X 5 MM MISC Use to inject Lantus at bedtime. The patient is insulin requiring, ICD 10  code E11.65. The patient injects 1 times per day. 02/25/18   Alphonzo Grieve, MD  montelukast (SINGULAIR) 10 MG tablet Take 1 tablet (10 mg total) by mouth at bedtime. 05/22/19   Deneise Lever, MD  mupirocin ointment (BACTROBAN) 2 % Apply 1 application topically 2 (two) times daily. 05/28/19   Trula Slade, DPM  ondansetron (ZOFRAN) 4 MG tablet  06/27/19   [provider]  oxyCODONE (OXY IR/ROXICODONE) 5 MG immediate release tablet  06/26/19   [provider]  quinapril (ACCUPRIL) 40 MG tablet TAKE ONE TABLET BY MOUTH DAILY 08/01/18   Alphonzo Grieve, MD  rOPINIRole (REQUIP) 1 MG tablet Take 1 mg by mouth at bedtime. 03/05/19   [provider]  simvastatin (ZOCOR) 80 MG tablet TAKE 1 TABLET BY MOUTH BEFORE BEDTIME 08/01/18   Alphonzo Grieve, MD  sulfamethoxazole-trimethoprim (BACTRIM DS) 800-160 MG tablet Take 1 tablet by mouth 2 (two) times daily. 06/25/19   Trula Slade, DPM  SYNJARDY 12.09-998 MG TABS TAKE 1 TABLET BY MOUTH 2 (TWO) TIMES DAILY WITH A MEAL. 08/06/18   Alphonzo Grieve, MD  timolol (TIMOPTIC) 0.25 % ophthalmic solution  06/27/19   [provider]  traMADol (ULTRAM) 50 MG tablet Take 1 tablet (50 mg total) by mouth every 8 (eight) hours as needed. 03/21/19   Thurman Coyer, DO  traZODone (DESYREL) 100 MG tablet Take 100 mg by mouth at bedtime as needed. 05/07/19   [provider]    Allergies    Diclofenac sodium, Hydrocodone, Penicillins, Voltaren [diclofenac], and Codeine  Review of Systems   Review of Systems  All other systems reviewed and are negative.   Physical Exam Updated Vital Signs BP (!) 177/75 (BP Location: Right Arm)   Pulse 82   Temp 97.8 F (36.6 C) (Oral)   Resp 19   SpO2 99%   Physical Exam Vitals and nursing note reviewed.  Constitutional:      General: She is not in acute distress.    Appearance: She is well-developed.  HENT:     Head: Normocephalic and atraumatic.     Right Ear: External  ear normal.     Left Ear: External ear normal.  Eyes:     General: No scleral icterus.       Right eye: No discharge.        Left eye: No discharge.     Conjunctiva/sclera: Conjunctivae normal.  Neck:     Trachea: No tracheal deviation.  Cardiovascular:     Rate and Rhythm: Normal rate and regular rhythm.  Pulmonary:     Effort: Pulmonary effort is normal. No respiratory distress.     Breath sounds: Normal breath sounds. No stridor. No wheezing or rales.  Abdominal:     General: Bowel sounds are normal. There is no distension.     Palpations: Abdomen is soft.     Tenderness: There is no abdominal tenderness. There is no guarding or rebound.  Musculoskeletal:        General: No tenderness.     Cervical back: Neck supple.  Skin:    General: Skin is warm and dry.     Findings: No rash.  Neurological:     Mental Status: She is alert and oriented to person, place, and time.     Cranial Nerves: No cranial nerve deficit (no facial droop, extraocular movements intact, no slurred speech).     Sensory: No sensory deficit.     Motor: No weakness, abnormal muscle tone or seizure activity.     Coordination: Coordination normal.     Comments: Patient is aware she is at [facility], she recalls the dates she can tell me her birthday     ED Results / Procedures / Treatments   Labs (all labs ordered are listed, but only abnormal results are displayed) Labs Reviewed  CBG MONITORING, ED - Abnormal; Notable for the following components:      Result Value   Glucose-Capillary 154 (*)    All other components within normal limits    EKG None  Radiology No results found.  Procedures Procedures (including critical care time)  Medications Ordered in ED Medications - No data to display  ED Course  I have reviewed the triage vital signs and the nursing notes.  Pertinent labs & imaging results that were available during my care of the patient were reviewed by me and considered in  my medical decision making (see chart for details).    MDM Rules/Calculators/A&P                      Patient presented to the ED for evaluation of altered mental status.  Patient now is alert and oriented.  She is adamant that she does not want to be here in the emergency room.  Patient states she was brought here against her will.  She is not entirely sure why she is here.  She did eventually admit that she was having trouble standing at the doctor's office.  Patient definitely is vague about what exactly occurred.  However at this time the patient is hemodynamically stable.  She is afebrile.  There are no signs of infection.  She has no focal neurologic deficits noted on my exam.  She does not appear to be responding to any internal and external stimuli.  I explained to the patient that I was concerned that something must of happened for someone to call 911 especially if she was at a doctor's office.  I explained I certainly cannot say that something serious and dangerous is not going on.  Patient appears to understand that.  I do not think she warrants holding her against her will.  Patient will be allowed to leave AMA. Final Clinical Impression(s) / ED Diagnoses Final diagnoses:  Altered mental status, unspecified altered mental status type    Rx / DC Orders ED Discharge Orders    None       Dorie Rank, MD 08/14/19 [phone]

## 2019-08-22 DIAGNOSIS — E1136 Type 2 diabetes mellitus with diabetic cataract: Secondary | ICD-10-CM | POA: Diagnosis not present

## 2019-08-22 DIAGNOSIS — I10 Essential (primary) hypertension: Secondary | ICD-10-CM | POA: Diagnosis not present

## 2019-08-22 DIAGNOSIS — Z79899 Other long term (current) drug therapy: Secondary | ICD-10-CM | POA: Diagnosis not present

## 2019-08-22 DIAGNOSIS — E11649 Type 2 diabetes mellitus with hypoglycemia without coma: Secondary | ICD-10-CM | POA: Diagnosis not present

## 2019-08-23 ENCOUNTER — Ambulatory Visit: Payer: BC Managed Care – PPO | Admitting: Internal Medicine

## 2019-08-30 ENCOUNTER — Ambulatory Visit: Payer: BC Managed Care – PPO | Admitting: Internal Medicine

## 2019-09-05 ENCOUNTER — Ambulatory Visit: Payer: BC Managed Care – PPO | Admitting: Internal Medicine

## 2019-09-06 ENCOUNTER — Ambulatory Visit: Payer: BC Managed Care – PPO | Admitting: Internal Medicine

## 2019-10-03 ENCOUNTER — Encounter: Payer: Self-pay | Admitting: Internal Medicine

## 2019-10-03 ENCOUNTER — Ambulatory Visit (INDEPENDENT_AMBULATORY_CARE_PROVIDER_SITE_OTHER): Payer: BC Managed Care – PPO | Admitting: Internal Medicine

## 2019-10-03 ENCOUNTER — Other Ambulatory Visit: Payer: Self-pay

## 2019-10-03 DIAGNOSIS — K219 Gastro-esophageal reflux disease without esophagitis: Secondary | ICD-10-CM

## 2019-10-03 DIAGNOSIS — J4540 Moderate persistent asthma, uncomplicated: Secondary | ICD-10-CM

## 2019-10-03 DIAGNOSIS — H2513 Age-related nuclear cataract, bilateral: Secondary | ICD-10-CM | POA: Diagnosis not present

## 2019-10-03 DIAGNOSIS — H25013 Cortical age-related cataract, bilateral: Secondary | ICD-10-CM | POA: Diagnosis not present

## 2019-10-03 DIAGNOSIS — H25043 Posterior subcapsular polar age-related cataract, bilateral: Secondary | ICD-10-CM | POA: Diagnosis not present

## 2019-10-03 DIAGNOSIS — J4520 Mild intermittent asthma, uncomplicated: Secondary | ICD-10-CM | POA: Diagnosis not present

## 2019-10-03 DIAGNOSIS — H40212 Acute angle-closure glaucoma, left eye: Secondary | ICD-10-CM | POA: Diagnosis not present

## 2019-10-03 MED ORDER — TRELEGY ELLIPTA 100-62.5-25 MCG/INH IN AEPB: 1.0000 | INHALATION_SPRAY | Freq: Every day | RESPIRATORY_TRACT | 12 refills | Status: DC

## 2019-10-03 MED ORDER — ALBUTEROL SULFATE HFA 108 (90 BASE) MCG/ACT IN AERS: 2.0000 | INHALATION_SPRAY | Freq: Four times a day (QID) | RESPIRATORY_TRACT | 12 refills | Status: DC | PRN

## 2019-10-03 NOTE — Patient Instructions (Signed)
Refill scripts sent for your inhalers  Please call if we can help

## 2019-10-03 NOTE — Progress Notes (Signed)
Subjective:     Patient ID: Brooke Floyd, female   DOB: 1965/09/26, 54 y.o.   MRN: [id number]  HPI  female never smoker followed for asthma complicated by GERD, DM, GERD, HBP, restless legs PFT 04/26/17-Mild to moderate obstructive airways disease with significant response to bronchodilator, mild restriction of total lung capacity, mild reduction of diffusion.  FVC 1.89/71%, FEV1 1.70/80%, ratio 0.90, TLC 71%, DLCO 64%. ---------------------------------------------------------------------------------------------------   05/22/19- Virtual Visit via Telephone Note History of Present Illness: 54 year old female never smoker followed for Asthma complicated by GERD, DM, GERD, HBP, restless legs ropinirole 1 mg, Mirapex 0.75 mg, Singulair, Breo 100, albuterol hfa Note- ACEI quinapril She has noted more asthma over past year- unclear why. Out of singulair and feels that helps. Waking at night smothered, needing to use rescue inhaler.  Does have GERD- discussed possibility of reflux to watch for and discuss with PCP. Helps to sleep with adjustable bed head raised, but husband doesn't like that. They are looking into dual/ separate control adjustable mattress.    Observations/Objective: CXR 12/21/16-  No active cardiopulmonary disease.  Assessment and Plan: Asthma moderate persistent not well controlled- try samples Trelegy. Consider Neb. GERD- not well controlled- she will discuss w PCP  Follow Up Instructions: 3 months  10/03/19- 54 year old female never smoker followed for Asthma complicated by GERD, DM, GERD, HBP, restless legs, Glaucoma,  ropinirole 1 mg, Singulair, Trelegy 100, albuterol hfa Note- ACEI quinapril, timolol,  -----f/u Asthma mild intermittent well controlled.  Pending retinal surgery.  Respiratory stable with only occ dry cough. Using rescue inhaler every other week. Was sensitive to Spring pollen with some chest tightness and postnasal drip. Woke last month with "bad  attack"- description sounds like reflux event. Feels well today.   ROS-see HPI   + = positive Constitutional:   No-   weight loss, night sweats, fevers, chills, fatigue, lassitude.  HEENT:   No-  headaches, difficulty swallowing, tooth/dental problems, sore throat,       No-  sneezing, itching, ear ache,+ nasal congestion, post nasal drip,  CV:  No-   chest pain, orthopnea, PND, swelling in lower extremities, anasarca, dizziness, palpitations Resp: +   shortness of breath with exertion or at rest.              No-   productive cough, +non-productive cough,  No- coughing up of blood.              No-   change in color of mucus.  + wheezing.   Skin: No-   rash or lesions. GI:  +heartburn, indigestion, abdominal pain, nausea, vomiting,  GU:  MS:  No-   joint pain or swelling.  . Neuro-     nothing unusual Psych:  No- change in mood or affect. No depression or anxiety.  No memory loss.  OBJ- Physical Exam General- Alert, Oriented, Affect-appropriate, Distress- none acute.  Skin- rash-none, lesions- none, excoriation- none Lymphadenopathy- tender L axilla c/w hydradinitis Head- atraumatic            Eyes- Gross vision intact, PERRLA, conjunctivae and secretions clear            Ears- Hearing, canals-normal            Nose- Clear, no-Septal dev, mucus, polyps, erosion, perforation             Throat- Mallampati III , mucosa+ lightly coated, drainage- none, tonsils- atrophic + hoarse Neck- flexible , trachea midline, no stridor , thyroid nl,  carotid no bruit Chest - symmetrical excursion , unlabored           Heart/CV- RRR , no murmur , no gallop  , no rub, nl s1 s2                           - JVD- none , edema- none, stasis changes- none, varices- none           Lung- clear, wheeze- none, cough- none , dullness-none, rub- none, unlabored           Chest wall-  Abd-  Br/ Gen/ Rectal- Not done, not indicated Extrem- cyanosis- none, clubbing, none, atrophy- none, strength- nl Neuro-  grossly intact to observation

## 2019-10-04 ENCOUNTER — Ambulatory Visit: Payer: BC Managed Care – PPO | Admitting: Internal Medicine

## 2019-10-12 DIAGNOSIS — Z20822 Contact with and (suspected) exposure to covid-19: Secondary | ICD-10-CM | POA: Diagnosis not present

## 2019-10-12 DIAGNOSIS — Z03818 Encounter for observation for suspected exposure to other biological agents ruled out: Secondary | ICD-10-CM | POA: Diagnosis not present

## 2019-10-24 DIAGNOSIS — Z7984 Long term (current) use of oral hypoglycemic drugs: Secondary | ICD-10-CM | POA: Diagnosis not present

## 2019-10-24 DIAGNOSIS — E119 Type 2 diabetes mellitus without complications: Secondary | ICD-10-CM | POA: Diagnosis not present

## 2019-10-29 DIAGNOSIS — H40211 Acute angle-closure glaucoma, right eye: Secondary | ICD-10-CM | POA: Diagnosis not present

## 2019-11-16 NOTE — Assessment & Plan Note (Signed)
Emphasize reflux precautions

## 2019-11-16 NOTE — Assessment & Plan Note (Signed)
Discussed exacerbation, role of reflux precautions, maintenance inhaler vs rescue inhaler. Plan- Trelegy 100

## 2019-11-19 ENCOUNTER — Other Ambulatory Visit: Payer: Self-pay | Admitting: Internal Medicine

## 2019-11-19 MED ORDER — TRELEGY ELLIPTA 100-62.5-25 MCG/INH IN AEPB: 1.0000 | INHALATION_SPRAY | Freq: Every day | RESPIRATORY_TRACT | 3 refills | Status: DC

## 2019-11-21 DIAGNOSIS — E113522 Type 2 diabetes mellitus with proliferative diabetic retinopathy with traction retinal detachment involving the macula, left eye: Secondary | ICD-10-CM | POA: Diagnosis not present

## 2019-11-21 DIAGNOSIS — E113511 Type 2 diabetes mellitus with proliferative diabetic retinopathy with macular edema, right eye: Secondary | ICD-10-CM | POA: Diagnosis not present

## 2019-11-21 DIAGNOSIS — H3342 Traction detachment of retina, left eye: Secondary | ICD-10-CM | POA: Diagnosis not present

## 2019-11-21 DIAGNOSIS — H2513 Age-related nuclear cataract, bilateral: Secondary | ICD-10-CM | POA: Diagnosis not present

## 2019-11-26 DIAGNOSIS — E1169 Type 2 diabetes mellitus with other specified complication: Secondary | ICD-10-CM | POA: Diagnosis not present

## 2019-11-26 DIAGNOSIS — J302 Other seasonal allergic rhinitis: Secondary | ICD-10-CM | POA: Diagnosis not present

## 2019-11-26 DIAGNOSIS — I152 Hypertension secondary to endocrine disorders: Secondary | ICD-10-CM | POA: Diagnosis not present

## 2019-11-26 DIAGNOSIS — J4540 Moderate persistent asthma, uncomplicated: Secondary | ICD-10-CM | POA: Diagnosis not present

## 2019-11-26 DIAGNOSIS — E785 Hyperlipidemia, unspecified: Secondary | ICD-10-CM | POA: Diagnosis not present

## 2019-11-26 DIAGNOSIS — E1159 Type 2 diabetes mellitus with other circulatory complications: Secondary | ICD-10-CM | POA: Diagnosis not present

## 2019-12-04 DIAGNOSIS — E113542 Type 2 diabetes mellitus with proliferative diabetic retinopathy with combined traction retinal detachment and rhegmatogenous retinal detachment, left eye: Secondary | ICD-10-CM | POA: Diagnosis not present

## 2019-12-04 DIAGNOSIS — H2512 Age-related nuclear cataract, left eye: Secondary | ICD-10-CM | POA: Diagnosis not present

## 2019-12-04 DIAGNOSIS — H21542 Posterior synechiae (iris), left eye: Secondary | ICD-10-CM | POA: Diagnosis not present

## 2019-12-31 DIAGNOSIS — M19012 Primary osteoarthritis, left shoulder: Secondary | ICD-10-CM | POA: Diagnosis not present

## 2020-01-09 DIAGNOSIS — E113522 Type 2 diabetes mellitus with proliferative diabetic retinopathy with traction retinal detachment involving the macula, left eye: Secondary | ICD-10-CM | POA: Diagnosis not present

## 2020-01-09 DIAGNOSIS — H3582 Retinal ischemia: Secondary | ICD-10-CM | POA: Diagnosis not present

## 2020-01-09 DIAGNOSIS — H43821 Vitreomacular adhesion, right eye: Secondary | ICD-10-CM | POA: Diagnosis not present

## 2020-01-29 ENCOUNTER — Ambulatory Visit: Payer: BC Managed Care – PPO | Admitting: Podiatry

## 2020-02-06 ENCOUNTER — Other Ambulatory Visit: Payer: Self-pay | Admitting: Adult Health Nurse Practitioner

## 2020-02-06 DIAGNOSIS — Z1231 Encounter for screening mammogram for malignant neoplasm of breast: Secondary | ICD-10-CM

## 2020-02-12 ENCOUNTER — Ambulatory Visit (INDEPENDENT_AMBULATORY_CARE_PROVIDER_SITE_OTHER): Payer: BC Managed Care – PPO | Admitting: Podiatry

## 2020-02-12 ENCOUNTER — Other Ambulatory Visit: Payer: Self-pay

## 2020-02-12 ENCOUNTER — Encounter: Payer: Self-pay | Admitting: Podiatry

## 2020-02-12 DIAGNOSIS — M79675 Pain in left toe(s): Secondary | ICD-10-CM

## 2020-02-12 DIAGNOSIS — E113511 Type 2 diabetes mellitus with proliferative diabetic retinopathy with macular edema, right eye: Secondary | ICD-10-CM | POA: Diagnosis not present

## 2020-02-12 DIAGNOSIS — M79674 Pain in right toe(s): Secondary | ICD-10-CM | POA: Diagnosis not present

## 2020-02-12 DIAGNOSIS — L600 Ingrowing nail: Secondary | ICD-10-CM

## 2020-02-12 NOTE — Progress Notes (Signed)
Subjective:   Patient ID: Brooke Floyd, female   DOB: 54 y.o.   MRN: [id number]   HPI Patient concerned about some pain in her big toe of her right foot and states that she had an ingrown fixed and its been giving her trouble and she noted some drainage and also all her nails are little bit irritated in the corners and she wanted them checked   ROS      Objective:  Physical Exam  Neurovascular status intact with low-grade irritation of the right hallux medial side with slight redness but no drainage noted and patient noted to have elongated nailbeds that become incurvated in the corners 1-5 both feet     Assessment:  2 problems with 1 being slight incurvation of the nail bed with and the other being.  Possible low-grade paronychia infection right hallux with incurvated overall nailbeds      Plan:  H&P reviewed conditions.  At this point I debrided nailbeds and for the right hallux I discussed possibly cleaning out the corner but will get a hold off and I educated her on soaks padding and topical medication.  If symptoms persist or get worse she will come in immediately and I spent a great deal time going over the possibility for this.  I did debride all nails to take pressure off the corners

## 2020-03-19 ENCOUNTER — Ambulatory Visit: Payer: BC Managed Care – PPO | Admitting: Podiatry

## 2020-03-26 DIAGNOSIS — H35033 Hypertensive retinopathy, bilateral: Secondary | ICD-10-CM | POA: Diagnosis not present

## 2020-03-26 DIAGNOSIS — E113592 Type 2 diabetes mellitus with proliferative diabetic retinopathy without macular edema, left eye: Secondary | ICD-10-CM | POA: Diagnosis not present

## 2020-03-26 DIAGNOSIS — H3582 Retinal ischemia: Secondary | ICD-10-CM | POA: Diagnosis not present

## 2020-03-26 DIAGNOSIS — E113511 Type 2 diabetes mellitus with proliferative diabetic retinopathy with macular edema, right eye: Secondary | ICD-10-CM | POA: Diagnosis not present

## 2020-04-01 DIAGNOSIS — G2581 Restless legs syndrome: Secondary | ICD-10-CM | POA: Diagnosis not present

## 2020-04-01 DIAGNOSIS — J309 Allergic rhinitis, unspecified: Secondary | ICD-10-CM | POA: Diagnosis not present

## 2020-04-01 DIAGNOSIS — Z23 Encounter for immunization: Secondary | ICD-10-CM | POA: Diagnosis not present

## 2020-04-01 DIAGNOSIS — E1169 Type 2 diabetes mellitus with other specified complication: Secondary | ICD-10-CM | POA: Diagnosis not present

## 2020-04-01 DIAGNOSIS — E785 Hyperlipidemia, unspecified: Secondary | ICD-10-CM | POA: Diagnosis not present

## 2020-04-01 DIAGNOSIS — I152 Hypertension secondary to endocrine disorders: Secondary | ICD-10-CM | POA: Diagnosis not present

## 2020-04-01 DIAGNOSIS — I10 Essential (primary) hypertension: Secondary | ICD-10-CM | POA: Diagnosis not present

## 2020-04-01 DIAGNOSIS — E1159 Type 2 diabetes mellitus with other circulatory complications: Secondary | ICD-10-CM | POA: Diagnosis not present

## 2020-04-01 DIAGNOSIS — Z111 Encounter for screening for respiratory tuberculosis: Secondary | ICD-10-CM | POA: Diagnosis not present

## 2020-04-17 ENCOUNTER — Other Ambulatory Visit: Payer: Self-pay

## 2020-04-17 ENCOUNTER — Ambulatory Visit
Admission: RE | Admit: 2020-04-17 | Discharge: 2020-04-17 | Disposition: A | Discharge status: Home / Self Care | Payer: BC Managed Care – PPO | Source: Ambulatory Visit | Attending: Adult Health Nurse Practitioner | Admitting: Adult Health Nurse Practitioner

## 2020-04-17 DIAGNOSIS — L638 Other alopecia areata: Secondary | ICD-10-CM | POA: Diagnosis not present

## 2020-04-17 DIAGNOSIS — Z1231 Encounter for screening mammogram for malignant neoplasm of breast: Secondary | ICD-10-CM

## 2020-08-19 ENCOUNTER — Telehealth: Payer: Self-pay | Admitting: Internal Medicine

## 2020-08-19 DIAGNOSIS — M7531 Calcific tendinitis of right shoulder: Secondary | ICD-10-CM | POA: Insufficient documentation

## 2020-08-19 DIAGNOSIS — J4540 Moderate persistent asthma, uncomplicated: Secondary | ICD-10-CM

## 2020-08-19 DIAGNOSIS — J4520 Mild intermittent asthma, uncomplicated: Secondary | ICD-10-CM

## 2020-08-19 MED ORDER — PREDNISONE 10 MG PO TABS: 20.0000 mg | ORAL_TABLET | Freq: Every day | ORAL | 0 refills | Status: DC

## 2020-08-19 MED ORDER — ALBUTEROL SULFATE (2.5 MG/3ML) 0.083% IN NEBU: 2.5000 mg | INHALATION_SOLUTION | Freq: Four times a day (QID) | RESPIRATORY_TRACT | 12 refills | Status: DC | PRN

## 2020-08-19 MED ORDER — ALBUTEROL SULFATE HFA 108 (90 BASE) MCG/ACT IN AERS: 2.0000 | INHALATION_SPRAY | Freq: Four times a day (QID) | RESPIRATORY_TRACT | 12 refills | Status: AC | PRN

## 2020-08-19 NOTE — Telephone Encounter (Signed)
Most likely this is Spring pollen aggravating her asthma  Ok to : Refill albuterol rescue inhaler x 12  Script for compressor nebulizer and for albuterol 0.083% neb solution  75 ml, ref x 12,   1 neb every 6 hours if needed  Prednisone 20 mg, # 5    1 daily x 5 days

## 2020-08-19 NOTE — Telephone Encounter (Signed)
Patient returned call, advised of recommendations per Dr. Annamaria Boots. She verbalized understanding.   Verified pharmacy and scripts sent.

## 2020-08-19 NOTE — Telephone Encounter (Signed)
Primary Pulmonologist: Young Last office visit and with whom: 10/03/19 Young What do we see them for (pulmonary problems): Asthma Last OV assessment/plan:  Assessment & Plan Note by Deneise Lever, MD at 11/16/2019 8:30 PM  Author: Deneise Lever, MD Author Type: Physician Filed: 11/16/2019 8:30 PM  Note Status: Written Cosign: Cosign Not Required Encounter Date: 10/03/2019  Problem: GERD  Editor: Deneise Lever, MD (Physician)               Emphasize reflux precautions       Assessment & Plan Note by Deneise Lever, MD at 11/16/2019 8:27 PM  Author: Deneise Lever, MD Author Type: Physician Filed: 11/16/2019 8:29 PM  Note Status: Written Cosign: Cosign Not Required Encounter Date: 10/03/2019  Problem: Asthma, moderate persistent  Editor: Deneise Lever, MD (Physician)               Discussed exacerbation, role of reflux precautions, maintenance inhaler vs rescue inhaler. Plan- Trelegy 100       Patient Instructions by Deneise Lever, MD at 10/03/2019 4:00 PM  Author: Deneise Lever, MD Author Type: Physician Filed: 10/03/2019 4:17 PM  Note Status: Signed Cosign: Cosign Not Required Encounter Date: 10/03/2019  Editor: Deneise Lever, MD (Physician)               Refill scripts sent for your inhalers  Please call if we can help        Instructions     Return in about 1 year (around 10/02/2020).  Refill scripts sent for your inhalers  Please call if we can help       Reason for call:  She has been sob, cough and wheezing since Sunday (08/16/20).  States her chest hurts from coughing and has a HA from coughing.  States she feels it was brought on by the whether change.  She has used her Albuterol inhaler 8 times since Sunday (twice daily).  Using Trelegy daily, states he needs a refill.  Asking for a nebulizer and Albuterol nebulizer solution to be called in for her.  She goes to her friends house and uses her nebulizer machine/solution instead  of going to the ED.  Denies any fever or chills.  States she had body aches on Sunday and "felt like she was going to die".  She has not tried anything over the counter for the cough.  Covid vaccine is up to date, has not had booster yet.  Did not receive flu vaccine.  Received shingles vaccines.  Dr. Annamaria Boots, please advise thank you.  (examples of things to ask: : When did symptoms start? Fever? Cough? Productive? Color to sputum? More sputum than usual? Wheezing? Have you needed increased oxygen? Are you taking your respiratory medications? What over the counter measures have you tried?)  Allergies  Allergen Reactions  . Diclofenac Sodium Itching  . Hydrocodone Itching  . Penicillins Other (See Comments)    convulsions  . Voltaren [Diclofenac] Itching  . Codeine Itching    Immunization History  Administered Date(s) Administered  . Influenza Split 02/07/2011  . Influenza Whole 03/05/2008, 02/02/2010  . Influenza, Quadrivalent, Recombinant, Inj, Pf 03/20/2019  . Influenza,inj,Quad PF,6+ Mos 01/02/2013, 01/20/2014, 03/29/2016, 02/08/2017, 02/07/2018  . PPD Test 09/21/2016  . Pneumococcal Polysaccharide-23 01/02/2013  . Tdap 03/29/2016

## 2020-08-19 NOTE — Telephone Encounter (Signed)
ATC x1, no answer, VM full.

## 2020-09-03 ENCOUNTER — Telehealth: Payer: Self-pay | Admitting: Internal Medicine

## 2020-09-03 MED ORDER — PROMETHAZINE-CODEINE 6.25-10 MG/5ML PO SYRP: 5.0000 mL | ORAL_SOLUTION | Freq: Four times a day (QID) | ORAL | 0 refills | Status: DC | PRN

## 2020-09-03 NOTE — Telephone Encounter (Signed)
Codeine cough syrup sent to [facility]. Please cancel the one to [facility] Thanks

## 2020-09-03 NOTE — Telephone Encounter (Signed)
Called and spoke with patient who states she is calling to see if she can get some cough syrup. Patient states Dr. Annamaria Boots has given her codeine cough syrup in the past. Patient has dry cough, has tried cough drops and OTC Delsym cough syrup, no relief. Pharmacy- [facility]   Dr. Annamaria Boots please advise

## 2020-09-03 NOTE — Telephone Encounter (Signed)
Script sent for promethazine codeine cough syrup to [facility]. As requested.  Please clarify with her- her allergy list says she is allergic to codeine and hydrocodone. At least need to comment that she can tolerate the cough syrup.  Also please make sure she keeps her May appointment. We can't refill meds without seeing her once a year.

## 2020-09-03 NOTE — Telephone Encounter (Signed)
Dr. Annamaria Boots please send the prescription to [facility]. Order is pended below

## 2020-09-03 NOTE — Telephone Encounter (Signed)
[facility] Patient notified. Nothing further needed

## 2020-10-01 NOTE — Progress Notes (Signed)
Subjective:     Patient ID: Brooke Floyd, female   DOB: [birth year], 55 y.o.   MRN: [id number]  HPI  female never smoker followed for asthma complicated by GERD, DM, GERD, HBP, restless legs PFT 04/26/17-Mild to moderate obstructive airways disease with significant response to bronchodilator, mild restriction of total lung capacity, mild reduction of diffusion.  FVC 1.89/71%, FEV1 1.70/80%, ratio 0.90, TLC 71%, DLCO 64%. ---------------------------------------------------------------------------------------------------   10/03/19- 55 year old female never smoker followed for Asthma complicated by GERD, DM, GERD, HBP, restless legs, Glaucoma,  ropinirole 1 mg, Singulair, Trelegy 100, albuterol hfa Note- ACEI quinapril, timolol,  -----f/u Asthma mild intermittent well controlled.  Pending retinal surgery.  Respiratory stable with only occ dry cough. Using rescue inhaler every other week. Was sensitive to Spring pollen with some chest tightness and postnasal drip. Woke last month with "bad attack"- description sounds like reflux event. Feels well today.   10/02/20- 55 year old female never smoker followed for Asthma complicated by GERD, DM2, GERD, HBP, restless legs, Glaucoma/ Blind L eye,  ropinirole 1 mg, Singulair, Trelegy 100, albuterol hfa, neb albuterol,  Note- ACEI quinapril, timolol,  We sent prednisone, refilled Ventolin hfa, Rx'd nebulizer/ albuterol, Rx'd prednisone for exacerb in April. Covid vax- 2 Phizer Nasal congestion at night helped by flonase and elevating HOB.  Cough at night. Aware of heartburn. Likes nebulizer and using rescue every other day.    ROS-see HPI   + = positive Constitutional:   No-   weight loss, night sweats, fevers, chills, fatigue, lassitude.  HEENT:   No-  headaches, difficulty swallowing, tooth/dental problems, sore throat,       No-  sneezing, itching, ear ache,+ nasal congestion, post nasal drip,  CV:  No-   chest pain, orthopnea, PND, swelling  in lower extremities, anasarca, dizziness, palpitations Resp: +   shortness of breath with exertion or at rest.              No-   productive cough, +non-productive cough,  No- coughing up of blood.              No-   change in color of mucus.  + wheezing.   Skin: No-   rash or lesions. GI:  +heartburn, indigestion, abdominal pain, nausea, vomiting,  GU:  MS:  No-   joint pain or swelling.  . Neuro-     nothing unusual Psych:  No- change in mood or affect. No depression or anxiety.  No memory loss.  OBJ- Physical Exam General- Alert, Oriented, Affect-appropriate, Distress- none acute.  Skin- rash-none, lesions- none, excoriation- none Lymphadenopathy- tender L axilla c/w hydradinitis Head- atraumatic            Eyes- Gross vision intact, PERRLA, conjunctivae and secretions clear            Ears- Hearing, canals-normal            Nose- Clear, no-Septal dev, mucus, polyps, erosion, perforation             Throat- Mallampati III-IV , mucosa+ lightly coated, drainage- none, tonsils- atrophic , + teeth Neck- flexible , trachea midline, no stridor , thyroid nl, carotid no bruit Chest - symmetrical excursion , unlabored           Heart/CV- RRR , no murmur , no gallop  , no rub, nl s1 s2                           -  JVD- none , edema- none, stasis changes- none, varices- none           Lung- clear, wheeze- none, cough- none , dullness-none, rub- none, unlabored           Chest wall-  Abd-  Br/ Gen/ Rectal- Not done, not indicated Extrem- cyanosis- none, clubbing, none, atrophy- none, strength- nl Neuro- grossly intact to observation

## 2020-10-02 ENCOUNTER — Other Ambulatory Visit: Payer: Self-pay

## 2020-10-02 ENCOUNTER — Ambulatory Visit (INDEPENDENT_AMBULATORY_CARE_PROVIDER_SITE_OTHER): Payer: BC Managed Care – PPO

## 2020-10-02 ENCOUNTER — Ambulatory Visit (INDEPENDENT_AMBULATORY_CARE_PROVIDER_SITE_OTHER): Payer: BC Managed Care – PPO | Admitting: Internal Medicine

## 2020-10-02 ENCOUNTER — Encounter: Payer: Self-pay | Admitting: Internal Medicine

## 2020-10-02 VITALS — BP 130/72 | HR 100 | Ht 62.0 in | Wt 120.0 lb

## 2020-10-02 DIAGNOSIS — J4540 Moderate persistent asthma, uncomplicated: Secondary | ICD-10-CM | POA: Diagnosis not present

## 2020-10-02 DIAGNOSIS — K219 Gastro-esophageal reflux disease without esophagitis: Secondary | ICD-10-CM

## 2020-10-02 DIAGNOSIS — J3089 Other allergic rhinitis: Secondary | ICD-10-CM | POA: Diagnosis not present

## 2020-10-02 DIAGNOSIS — J302 Other seasonal allergic rhinitis: Secondary | ICD-10-CM | POA: Diagnosis not present

## 2020-10-02 MED ORDER — TRELEGY ELLIPTA 100-62.5-25 MCG/INH IN AEPB: 1.0000 | INHALATION_SPRAY | Freq: Every day | RESPIRATORY_TRACT | 3 refills | Status: DC

## 2020-10-02 MED ORDER — FAMOTIDINE 20 MG PO TABS: ORAL_TABLET | ORAL | 12 refills | Status: DC

## 2020-10-02 NOTE — Patient Instructions (Signed)
Script sent refilling Trelegy maintenance inhaler  Script sent adding Pepcid acid blocker- 1 daily before supper    Hopefully over time this will cut down on cough at night.  Agree it is good for you to sleep with your head elevated  Continue fluticasone/ Flonase nasal spray  Order- CXR   Dx Asthma moderate persistent

## 2020-12-30 ENCOUNTER — Telehealth: Payer: Self-pay | Admitting: Internal Medicine

## 2020-12-30 MED ORDER — BENZONATATE 200 MG PO CAPS: 200.0000 mg | ORAL_CAPSULE | Freq: Three times a day (TID) | ORAL | 1 refills | Status: DC | PRN

## 2020-12-30 NOTE — Telephone Encounter (Signed)
Called and spoke with Patient.  Patient stated she started having a nonproductive cough yesterday, that got worse over night.  Patient stated she has a slight sore throat, and hoarseness.  Patient has been taking Delsym, but it is not helping. Patient stated her and her household members are getting covid tested this morning. Patient denies body aches, fatigue, fever, or chills.  Patient stated she has a appetite and has not loss any taste, or smell. Patient stated she would call with covid test results, but she needs something to help with her cough.   Message routed to Dr. Annamaria Boots to advise  Allergies  Allergen Reactions   Diclofenac Sodium Itching   Hydrocodone Itching   Penicillins Other (See Comments)    convulsions   Voltaren [Diclofenac] Itching   Codeine Itching   Current Outpatient Medications on File Prior to Visit  Medication Sig Dispense Refill   albuterol (PROVENTIL) (2.5 MG/3ML) 0.083% nebulizer solution Take 3 mLs (2.5 mg total) by nebulization every 6 (six) hours as needed for wheezing or shortness of breath. 75 mL 12   albuterol (VENTOLIN HFA) 108 (90 Base) MCG/ACT inhaler Inhale 2 puffs into the lungs every 6 (six) hours as needed for wheezing or shortness of breath. 18 g 12   Blood Glucose Monitoring Suppl (FREESTYLE FREEDOM LITE) w/Device KIT check blood sugar up to 3 times a day 1 each 1   Bromfenac Sodium (PROLENSA) 0.07 % SOLN Place 1 drop into the left eye at bedtime.     clotrimazole-betamethasone (LOTRISONE) cream SMARTSIG:Topical Morning-Evening     famotidine (PEPCID) 20 MG tablet 1 tab daily before supper 30 tablet 12   Fluticasone-Umeclidin-Vilant (TRELEGY ELLIPTA) 100-62.5-25 MCG/INH AEPB Inhale 1 puff into the lungs daily. 180 each 3   gabapentin (NEURONTIN) 300 MG capsule Take 3 capsules 2 hours before bedtime; may take 1 capsule as needed during the day. 120 capsule 5   glipiZIDE (GLUCOTROL XL) 5 MG 24 hr tablet Take 5 mg by mouth every morning.     glucose  blood (FREESTYLE LITE) test strip Test blood sugar 3 times a day 100 each 12   hydrochlorothiazide (MICROZIDE) 12.5 MG capsule Take 1 capsule (12.5 mg total) by mouth daily. 90 capsule 3   imipramine (TOFRANIL) 25 MG tablet Take 25 mg by mouth at bedtime.     Insulin Pen Needle (PEN NEEDLES 3/16") 31G X 5 MM MISC Use to inject Lantus at bedtime. The patient is insulin requiring, ICD 10 code E11.65. The patient injects 1 times per day. 100 each 6   montelukast (SINGULAIR) 10 MG tablet Take 1 tablet (10 mg total) by mouth at bedtime. 90 tablet 3   ondansetron (ZOFRAN) 4 MG tablet      prednisoLONE acetate (PRED FORTE) 1 % ophthalmic suspension Place 1 drop into the left eye 4 (four) times daily.     promethazine-codeine (PHENERGAN WITH CODEINE) 6.25-10 MG/5ML syrup Take 5 mLs by mouth every 6 (six) hours as needed for cough. 200 mL 0   quinapril (ACCUPRIL) 40 MG tablet TAKE ONE TABLET BY MOUTH DAILY 90 tablet 3   rOPINIRole (REQUIP) 1 MG tablet Take 1 mg by mouth at bedtime.     simvastatin (ZOCOR) 80 MG tablet TAKE 1 TABLET BY MOUTH BEFORE BEDTIME 90 tablet 3   No current facility-administered medications on file prior to visit.

## 2020-12-30 NOTE — Telephone Encounter (Signed)
Called and spoke with patient. She verbalized understanding. She stated that she already tried Robtussin OTC but it did not help.   Will go ahead and send in the Tessalon perles for her.   She will call us once she has received her covid test results.

## 2020-12-30 NOTE — Telephone Encounter (Signed)
Tessalon perles 200 mg, # 30, 1 every 8 hours as needed for cough Can also continue to use otc cough syrup like Delsym

## 2020-12-31 ENCOUNTER — Telehealth: Payer: Self-pay | Admitting: Internal Medicine

## 2020-12-31 MED ORDER — PREDNISONE 10 MG PO TABS: ORAL_TABLET | ORAL | 0 refills | Status: DC

## 2020-12-31 MED ORDER — MOLNUPIRAVIR EUA 200MG CAPSULE: 4.0000 | ORAL_CAPSULE | Freq: Two times a day (BID) | ORAL | 0 refills | Status: AC

## 2020-12-31 MED ORDER — PROMETHAZINE-CODEINE 6.25-10 MG/5ML PO SYRP: 5.0000 mL | ORAL_SOLUTION | Freq: Four times a day (QID) | ORAL | 0 refills | Status: DC | PRN

## 2020-12-31 MED ORDER — AZITHROMYCIN 250 MG PO TABS: ORAL_TABLET | ORAL | 0 refills | Status: DC

## 2020-12-31 NOTE — Telephone Encounter (Signed)
Call made to patient, confirmed DOB. Patient reports she has developed a fever 101.1, cough with thick yellow mucous and a severe headache yesterday. She went for a covid test yesterday but has not yet received the results. She said they told her she can expect the results Saturday. She has taken motrin for fever and headache. Reports body aches. She confirms she is using Trelegy daily. Uses albuterol neb about once every other week. She reports the cough is bad and constant. Requesting phenergan with codeine cough medicine to at least help with her cough. She does not have a at home covid test. Motrin was taken 20 minutes ago. Instructed to re-check temp later. Denies increased SOB or chest pain.   CY please advise. Thanks :)

## 2020-12-31 NOTE — Telephone Encounter (Signed)
Send script for molnupiravir 4 caps (800 mg) twice daily x 5 days. Tell her that if she tests positive for Covid, then pick this up and start it. She probably has Covid.  Meanwhile, please send to start now:  Zpak 250 mg, # 6, 2 today then one daily and prednisone 10 mg, # 20, 4 X 2 DAYS, 3 X 2 DAYS, 2 X 2 DAYS, 1 X 2 DAYS  Also I have sent prometh codeine cough syrup to her [facility]  What ever she has is contagious and she should be masking and isolating to avoid spreading it to others.

## 2020-12-31 NOTE — Telephone Encounter (Signed)
LMTCB

## 2020-12-31 NOTE — Telephone Encounter (Signed)
I called and spoke with patient regarding DR. Young recs. I sent in [place] and pred taper and notified patient to take Molnupiravir on Saturday if positive for Covid, do not take if negative. Patient verbalized understanding and will call if not feeling better, nothing further needed.

## 2021-01-07 ENCOUNTER — Telehealth: Payer: Self-pay | Admitting: Internal Medicine

## 2021-01-07 NOTE — Telephone Encounter (Signed)
Primary Pulmonologist: Young Last office visit and with whom: 10/02/20 Young What do we see them for (pulmonary problems): Asthma Last OV assessment/plan:  Patient Instructions by Deneise Lever, MD at 10/02/2020 9:00 AM  Author: Deneise Lever, MD Author Type: Physician Filed: 10/02/2020  9:38 AM  Note Status: Signed Cosign: Cosign Not Required Encounter Date: 10/02/2020  Editor: Deneise Lever, MD (Physician)               Script sent refilling Trelegy maintenance inhaler   Script sent adding Pepcid acid blocker- 1 daily before supper    Hopefully over time this will cut down on cough at night.   Agree it is good for you to sleep with your head elevated   Continue fluticasone/ Flonase nasal spray   Order- CXR   Dx Asthma moderate persistent       Instructions   Return in about 4 months (around 02/02/2021). Script sent refilling Trelegy maintenance inhaler   Script sent adding Pepcid acid blocker- 1 daily before supper    Hopefully over time this will cut down on cough at night.       Reason for call:  Still not feeling well, still coughing and the mucous is now.  She has a hard time coughing up the mucous.  She is taking OTC musinex and drinking plenty of fluids.  She did not feel like the Tessalon tablets helped at all.  She finished the cough syrup prescribed and said that helped some.  She is using her Trelegy inhaler, but has not had to use her albuterol  inhaler or nebulizer, she denies any sob.  She has not taken her temperature, but does not feel like she has any fever, chills or body aches.  She finished the Azithromycin and took the last Prednisone tablet today.  Patient took 2 covid test and they were both negative, last test was on 8/29.  Dr. Annamaria Boots, please advise.  Thank you.  (examples of things to ask: : When did symptoms start? Fever? Cough? Productive? Color to sputum? More sputum than usual? Wheezing? Have you needed increased oxygen? Are you taking your  respiratory medications? What over the counter measures have you tried?)  Allergies  Allergen Reactions   Diclofenac Sodium Itching   Hydrocodone Itching   Penicillins Other (See Comments)    convulsions   Voltaren [Diclofenac] Itching   Codeine Itching    Immunization History  Administered Date(s) Administered   Influenza Split 02/07/2011   Influenza Whole 03/05/2008, 02/02/2010   Influenza, Quadrivalent, Recombinant, Inj, Pf 03/20/2019   Influenza,inj,Quad PF,6+ Mos 01/02/2013, 01/20/2014, 03/29/2016, 02/08/2017, 02/07/2018   Influenza-Unspecified 03/20/2019   PFIZER(Purple Top)SARS-COV-2 Vaccination 01/18/2020, 02/08/2020   PPD Test 09/21/2016, 04/01/2020   Pneumococcal Polysaccharide-23 01/02/2013   Tdap 03/29/2016   Zoster Recombinat (Shingrix) 04/01/2020, 05/15/2020

## 2021-01-07 NOTE — Telephone Encounter (Signed)
Called and spoke with patient. I offered to an appt tomorrow to see a NP but she refused. Was able to get her scheduled for 01/12/21 at 930am with Dr. Annamaria Boots. She is aware that if she gets worse over the weekend to go to a UC or ED.   Nothing further needed at time of call.

## 2021-01-07 NOTE — Telephone Encounter (Signed)
Please see if we can get her in for an appointment with me or an APP next week.  Go to UC or ED if worse this weekend.

## 2021-01-07 NOTE — Telephone Encounter (Signed)
Left message for patient to call back  

## 2021-01-11 NOTE — Progress Notes (Deleted)
Subjective:     Patient ID: Brooke Floyd, female   DOB: January 20, 1966, 55 y.o.   MRN: [id number]  HPI  female never smoker followed for asthma complicated by GERD, DM, GERD, HBP, restless legs PFT 04/26/17-Mild to moderate obstructive airways disease with significant response to bronchodilator, mild restriction of total lung capacity, mild reduction of diffusion.  FVC 1.89/71%, FEV1 1.70/80%, ratio 0.90, TLC 71%, DLCO 64%. ---------------------------------------------------------------------------------------------------   10/02/20- 55 year old female never smoker followed for Asthma complicated by GERD, DM2, GERD, HBP, restless legs, Glaucoma,  ropinirole 1 mg, Singulair, Trelegy 100, albuterol hfa, neb albuterol,  Note- ACEI quinapril, timolol,  We sent prednisone, refilled Ventolin hfa, Rx'd nebulizer/ albuterol, Rx'd prednisone for exacerb in April. Covid vax- 2 Phizer  01/12/21- 55 year old female never smoker followed for Asthma complicated by GERD, DM2, GERD, HBP, restless legs, Glaucoma,  -ropinirole 1 mg, Singulair, Trelegy 100, albuterol hfa, neb albuterol,  -Note- ACEI quinapril, timolol,  Covid vax- She called 8/25 c/o persistent cough after Zpak and prednisone, neg Covid test   CXR 10/02/20-  IMPRESSION: No active cardiopulmonary disease.   ROS-see HPI   + = positive Constitutional:   No-   weight loss, night sweats, fevers, chills, fatigue, lassitude.  HEENT:   No-  headaches, difficulty swallowing, tooth/dental problems, sore throat,       No-  sneezing, itching, ear ache,+ nasal congestion, post nasal drip,  CV:  No-   chest pain, orthopnea, PND, swelling in lower extremities, anasarca, dizziness, palpitations Resp: +   shortness of breath with exertion or at rest.              No-   productive cough, +non-productive cough,  No- coughing up of blood.              No-   change in color of mucus.  + wheezing.   Skin: No-   rash or lesions. GI:  +heartburn, indigestion,  abdominal pain, nausea, vomiting,  GU:  MS:  No-   joint pain or swelling.  . Neuro-     nothing unusual Psych:  No- change in mood or affect. No depression or anxiety.  No memory loss.  OBJ- Physical Exam General- Alert, Oriented, Affect-appropriate, Distress- none acute.  Skin- rash-none, lesions- none, excoriation- none Lymphadenopathy- tender L axilla c/w hydradinitis Head- atraumatic            Eyes- Gross vision intact, PERRLA, conjunctivae and secretions clear            Ears- Hearing, canals-normal            Nose- Clear, no-Septal dev, mucus, polyps, erosion, perforation             Throat- Mallampati III , mucosa+ lightly coated, drainage- none, tonsils- atrophic + hoarse Neck- flexible , trachea midline, no stridor , thyroid nl, carotid no bruit Chest - symmetrical excursion , unlabored           Heart/CV- RRR , no murmur , no gallop  , no rub, nl s1 s2                           - JVD- none , edema- none, stasis changes- none, varices- none           Lung- clear, wheeze- none, cough- none , dullness-none, rub- none, unlabored           Chest wall-  Abd-  Br/ Gen/ Rectal- Not done,  not indicated Extrem- cyanosis- none, clubbing, none, atrophy- none, strength- nl Neuro- grossly intact to observation

## 2021-01-12 ENCOUNTER — Ambulatory Visit: Payer: BC Managed Care – PPO | Admitting: Internal Medicine

## 2021-01-12 NOTE — Telephone Encounter (Signed)
error 

## 2021-02-03 ENCOUNTER — Ambulatory Visit: Payer: BC Managed Care – PPO | Admitting: Internal Medicine

## 2021-02-22 ENCOUNTER — Telehealth: Payer: Self-pay | Admitting: Internal Medicine

## 2021-02-22 ENCOUNTER — Other Ambulatory Visit: Payer: Self-pay | Admitting: Adult Health Nurse Practitioner

## 2021-02-22 DIAGNOSIS — Z1231 Encounter for screening mammogram for malignant neoplasm of breast: Secondary | ICD-10-CM

## 2021-02-22 NOTE — Telephone Encounter (Signed)
Called and spoke with pt and she is aware of the GI department with Brooke Floyd is who she seen in the past and she will call over and see if she can set her appt up with them.  Nothing further is needed.

## 2021-03-06 ENCOUNTER — Encounter: Payer: Self-pay | Admitting: Internal Medicine

## 2021-03-06 DIAGNOSIS — J3089 Other allergic rhinitis: Secondary | ICD-10-CM | POA: Insufficient documentation

## 2021-03-06 DIAGNOSIS — J302 Other seasonal allergic rhinitis: Secondary | ICD-10-CM | POA: Insufficient documentation

## 2021-03-06 NOTE — Assessment & Plan Note (Signed)
Emphasize reflux precautions, Rx pepcid

## 2021-03-06 NOTE — Assessment & Plan Note (Signed)
Continue Flonase Consider referral to Allergist if needed

## 2021-03-06 NOTE — Assessment & Plan Note (Signed)
Plan- CXR, refill Trelegy

## 2021-04-21 ENCOUNTER — Ambulatory Visit: Payer: BC Managed Care – PPO | Admitting: Internal Medicine

## 2021-04-21 ENCOUNTER — Ambulatory Visit
Admission: RE | Admit: 2021-04-21 | Discharge: 2021-04-21 | Disposition: A | Discharge status: Home / Self Care | Payer: BC Managed Care – PPO | Source: Ambulatory Visit | Attending: Adult Health Nurse Practitioner | Admitting: Adult Health Nurse Practitioner

## 2021-04-21 DIAGNOSIS — Z1231 Encounter for screening mammogram for malignant neoplasm of breast: Secondary | ICD-10-CM

## 2021-04-22 ENCOUNTER — Other Ambulatory Visit: Payer: Self-pay | Admitting: Adult Health Nurse Practitioner

## 2021-04-22 DIAGNOSIS — R928 Other abnormal and inconclusive findings on diagnostic imaging of breast: Secondary | ICD-10-CM

## 2021-04-27 NOTE — Progress Notes (Signed)
Subjective:     Patient ID: Brooke Floyd, female   DOB: December 09, 1965, 55 y.o.   MRN: [id number]  HPI  female never smoker followed for asthma complicated by GERD, DM, GERD, HBP, restless legs PFT 04/26/17-Mild to moderate obstructive airways disease with significant response to bronchodilator, mild restriction of total lung capacity, mild reduction of diffusion.  FVC 1.89/71%, FEV1 1.70/80%, ratio 0.90, TLC 71%, DLCO 64%. ---------------------------------------------------------------------------------------------------   10/02/20- 55 year old female never smoker followed for Asthma complicated by GERD, DM2, GERD, HBP, restless legs, Glaucoma/ Blind L eye,  ropinirole 1 mg, Singulair, Trelegy 100, albuterol hfa, neb albuterol,  Note- ACEI quinapril, timolol,  We sent prednisone, refilled Ventolin hfa, Rx'd nebulizer/ albuterol, Rx'd prednisone for exacerb in April. Covid vax- 2 Phizer Nasal congestion at night helped by flonase and elevating HOB.  Cough at night. Aware of heartburn. Likes nebulizer and using rescue every other day.  04/28/21- 55 year old female never smoker followed for Asthma complicated by GERD, DM2, HBP, restless legs, Blind L eye,  -ropinirole 1 mg, Singulair, Trelegy 100, albuterol hfa, neb albuterol, Tessalon,  Note- ACEI quinapril, timolol, Covid vax-3 Phizer Flu vax-had We sent prednisone taper in August. ACT score - 20 Ran out of Trelegy. Trelegy was helpful.  Would like it refilled.  Using nebulizer machine about twice a month, rescue inhaler 3 times a month.  Has avoided COVID infection.  She says she had cataract, not glaucoma. Recent abnormal mammogram right breast pending ultrasound. Working as Engineering geologist. CXR 10/02/20-  IMPRESSION: No active cardiopulmonary disease.   ROS-see HPI   + = positive Constitutional:   No-   weight loss, night sweats, fevers, chills, fatigue, lassitude.  HEENT:   No-  headaches, difficulty swallowing,  tooth/dental problems, sore throat,       No-  sneezing, itching, ear ache,+ nasal congestion, post nasal drip,  CV:  No-   chest pain, orthopnea, PND, swelling in lower extremities, anasarca, dizziness, palpitations Resp: +   shortness of breath with exertion or at rest.              No-   productive cough, +non-productive cough,  No- coughing up of blood.              No-   change in color of mucus.  + wheezing.   Skin: No-   rash or lesions. GI:  +heartburn, indigestion, abdominal pain, nausea, vomiting,  GU:  MS:  No-   joint pain or swelling.  . Neuro-     nothing unusual Psych:  No- change in mood or affect. No depression or anxiety.  No memory loss.  OBJ- Physical Exam General- Alert, Oriented, Affect-appropriate, Distress- none acute.  Skin- rash-none, lesions- none, excoriation- none Lymphadenopathy- tender L axilla c/w hydradinitis Head- atraumatic            Eyes- Gross vision intact, PERRLA, conjunctivae and secretions clear            Ears- Hearing, canals-normal            Nose- Clear, no-Septal dev, mucus, polyps, erosion, perforation             Throat- Mallampati III-IV , mucosa+ lightly coated, drainage- none, tonsils- atrophic , + teeth Neck- flexible , trachea midline, no stridor , thyroid nl, carotid no bruit Chest - symmetrical excursion , unlabored           Heart/CV- RRR , no murmur , no gallop  , no rub, nl s1 s2                           -  JVD- none , edema- none, stasis changes- none, varices- none           Lung- clear, wheeze- none, cough- none , dullness-none, rub- none, unlabored           Chest wall-  Abd-  Br/ Gen/ Rectal- Not done, not indicated Extrem- cyanosis- none, clubbing, none, atrophy- none, strength- nl Neuro- grossly intact to observation

## 2021-04-28 ENCOUNTER — Ambulatory Visit (INDEPENDENT_AMBULATORY_CARE_PROVIDER_SITE_OTHER): Payer: BC Managed Care – PPO | Admitting: Internal Medicine

## 2021-04-28 ENCOUNTER — Other Ambulatory Visit: Payer: Self-pay

## 2021-04-28 ENCOUNTER — Encounter: Payer: Self-pay | Admitting: Internal Medicine

## 2021-04-28 DIAGNOSIS — J4540 Moderate persistent asthma, uncomplicated: Secondary | ICD-10-CM

## 2021-04-28 DIAGNOSIS — K219 Gastro-esophageal reflux disease without esophagitis: Secondary | ICD-10-CM

## 2021-04-28 MED ORDER — TRELEGY ELLIPTA 100-62.5-25 MCG/ACT IN AEPB: INHALATION_SPRAY | RESPIRATORY_TRACT | 12 refills | Status: DC

## 2021-04-28 NOTE — Patient Instructions (Signed)
Trelegy refilled  Please call if we can help

## 2021-04-28 NOTE — Assessment & Plan Note (Signed)
Much better control in the past year with no acute episodes. Plan-continue current meds.  Refill Trelegy.

## 2021-04-28 NOTE — Assessment & Plan Note (Signed)
Reminded to maintain reflux precautions especially to avoid nighttime aspiration.

## 2021-05-05 ENCOUNTER — Other Ambulatory Visit: Payer: Self-pay | Admitting: Internal Medicine

## 2021-05-06 NOTE — Telephone Encounter (Signed)
Tessalon perles refilled at [facility]

## 2021-06-02 ENCOUNTER — Ambulatory Visit
Admission: RE | Admit: 2021-06-02 | Discharge: 2021-06-02 | Disposition: A | Discharge status: Home / Self Care | Payer: BC Managed Care – PPO | Source: Ambulatory Visit | Attending: Adult Health Nurse Practitioner | Admitting: Adult Health Nurse Practitioner

## 2021-06-02 ENCOUNTER — Ambulatory Visit: Payer: BC Managed Care – PPO

## 2021-06-02 DIAGNOSIS — R928 Other abnormal and inconclusive findings on diagnostic imaging of breast: Secondary | ICD-10-CM

## 2021-07-16 ENCOUNTER — Ambulatory Visit: Payer: Commercial Managed Care - HMO | Admitting: Nurse Practitioner

## 2021-07-16 ENCOUNTER — Encounter: Payer: Self-pay | Admitting: Nurse Practitioner

## 2021-07-16 ENCOUNTER — Other Ambulatory Visit: Payer: Self-pay

## 2021-07-16 VITALS — BP 116/67 | HR 93 | Temp 98.0°F | Ht 62.0 in | Wt 119.6 lb

## 2021-07-16 DIAGNOSIS — Z0000 Encounter for general adult medical examination without abnormal findings: Secondary | ICD-10-CM | POA: Diagnosis not present

## 2021-07-16 DIAGNOSIS — E1165 Type 2 diabetes mellitus with hyperglycemia: Secondary | ICD-10-CM | POA: Diagnosis not present

## 2021-07-16 DIAGNOSIS — E785 Hyperlipidemia, unspecified: Secondary | ICD-10-CM

## 2021-07-16 DIAGNOSIS — E113292 Type 2 diabetes mellitus with mild nonproliferative diabetic retinopathy without macular edema, left eye: Secondary | ICD-10-CM

## 2021-07-16 LAB — POCT URINALYSIS DIP (CLINITEK)
Bilirubin, UA: NEGATIVE
Blood, UA: NEGATIVE
Glucose, UA: NEGATIVE mg/dL
Ketones, POC UA: NEGATIVE mg/dL
Leukocytes, UA: NEGATIVE
Nitrite, UA: NEGATIVE
POC PROTEIN,UA: NEGATIVE
Spec Grav, UA: 1.015 (ref 1.010–1.025)
Urobilinogen, UA: 0.2 E.U./dL
pH, UA: 5.5 (ref 5.0–8.0)

## 2021-07-16 LAB — POCT GLYCOSYLATED HEMOGLOBIN (HGB A1C)
HbA1c POC (<> result, manual entry): 11.5 % (ref 4.0–5.6)
HbA1c, POC (controlled diabetic range): 11.5 % — AB (ref 0.0–7.0)
HbA1c, POC (prediabetic range): 11.5 % — AB (ref 5.7–6.4)
Hemoglobin A1C: 11.5 % — AB (ref 4.0–5.6)

## 2021-07-16 MED ORDER — NORTRIPTYLINE HCL 25 MG PO CAPS: 25.0000 mg | ORAL_CAPSULE | Freq: Every day | ORAL | 2 refills | Status: DC

## 2021-07-16 MED ORDER — GLIPIZIDE ER 10 MG PO TB24: 10.0000 mg | ORAL_TABLET | Freq: Every day | ORAL | 2 refills | Status: DC

## 2021-07-16 MED ORDER — EMPAGLIFLOZIN 25 MG PO TABS: 25.0000 mg | ORAL_TABLET | Freq: Every day | ORAL | 2 refills | Status: DC

## 2021-07-16 MED ORDER — LISINOPRIL 40 MG PO TABS: 40.0000 mg | ORAL_TABLET | Freq: Every day | ORAL | 2 refills | Status: DC

## 2021-07-16 MED ORDER — ROSUVASTATIN CALCIUM 20 MG PO TABS: 20.0000 mg | ORAL_TABLET | Freq: Every day | ORAL | 2 refills | Status: DC

## 2021-07-16 MED ORDER — TRULICITY 1.5 MG/0.5ML ~~LOC~~ SOAJ: 1.5000 mg | SUBCUTANEOUS | 0 refills | Status: DC

## 2021-07-16 NOTE — Patient Instructions (Signed)

## 2021-07-16 NOTE — Progress Notes (Signed)
? ?[place] ?[place],   [address] ?Phone:  [phone]   Fax:  [phone] ? ? ? ?New Patient Office Visit ? ?Subjective:  ?Patient ID: Brooke Floyd, female    DOB: Feb 14, 1966  Age: 56 y.o. MRN: [id number] ? ?CC:  ?Chief Complaint  ?Patient presents with  ? Establish Care  ?  Pt is here to establish care. Pt stated she wasn't happy with previous provider. Pt is concern with her diabetes  ? ? ?HPI ?Brooke Floyd presents to establish care. She  has a past medical history of Asthma, GERD (gastroesophageal reflux disease), Hyperlipidemia, Hypertension, Thrombocytosis (02/08/2017), and Type 2 diabetes mellitus ([place]).  ? ?Ms. Brooke Floyd is in today for evaluation of diabetes.  She has been dealing with diabetes at the age 58.she reports that she went on a cruise in December and her eating was out of control.  She feels like this is the start of her blood sugar.  Being out of control the prescribed treatment is jardinace 10 mg, Glipizide 10 mg and she was using Trulicity, along with statin therapy rosuvastatin. The reported use of treatment is not consistent with prescribed.  She has discontinued her medications due to not being effective.  Home glucose monitoring indicates a CBG range of 200s.  ? ?She reports that she was on Lantus and was doing well. She has lost 30 pounds. She does not like her current size. She was eating better. She was eating fruits and vegetables. Her previous A1c was 6.2% She does not feel like her previous provider was not helping her. There has been a professional eye exam in the year.  She has blindness in the left eye. ? ?She is under a lot of stress. 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  She is trying to pay off a vehicle. ? ? ? ? ?Past Medical History:  ?Diagnosis Date  ? Asthma   ? GERD (gastroesophageal reflux disease)   ? Hyperlipidemia   ? Hypertension   ? Thrombocytosis 02/08/2017  ? Type 2 diabetes mellitus ([place])   ? insulin  dependent  ? ? ?Past Surgical History:  ?Procedure Laterality Date  ? COMBINED HYSTERECTOMY ABDOMINAL W/ A&P REPAIR / OOPHORECTOMY    ? WRIST ARTHROSCOPY    ? ? ?Family History  ?Problem Relation Age of Onset  ? Coronary artery disease Father   ? Diabetes Father   ? Diabetes Mother   ? Breast cancer Neg Hx   ? Colon cancer Neg Hx   ? ? ?Social History  ? ?Socioeconomic History  ? Marital status: Married  ?  Spouse name: Not on file  ? Number of children: 2  ? Years of education: 4  ? Highest education level: Not on file  ?Occupational History  ? Occupation: CNA  ?Tobacco Use  ? Smoking status: Never  ? Smokeless tobacco: Never  ?Vaping Use  ? Vaping Use: Never used  ?Substance and Sexual Activity  ? Alcohol use: No  ?  Alcohol/week: 0.0 standard drinks  ? Drug use: No  ? Sexual activity: Not Currently  ?  Birth control/protection: Surgical  ?Other Topics Concern  ? Not on file  ?Social History Narrative  ? Not on file  ? ?Social Determinants of Health  ? ?Financial Resource Strain: Not on file  ?Food Insecurity: Not on file  ?Transportation Needs: Not on file  ?Physical Activity: Not on file  ?Stress: Not on file  ?  Social Connections: Not on file  ?Intimate Partner Violence: Not on file  ? ? ?ROS ?Review of Systems  ?Musculoskeletal:   ?     Recent right thumb injury ?Right leg abrasion ?  ?Skin:   ?     Pruritis using benadryl ?  ? ?Objective:  ? ?Today's Vitals: BP 116/67   Pulse 93   Temp 98 ?F (36.7 ?C)   Ht 5' 2"  (1.575 m)   Wt 119 lb 9.6 oz (54.3 kg)   SpO2 100%   BMI 21.88 kg/m?  ? ?Physical Exam ?Constitutional:   ?   Appearance: She is normal weight.  ? ? ?Assessment & Plan:  ? ?Problem List Items Addressed This Visit   ? ?  ? Other  ? Hyperlipidemia (Chronic) ?Stable  ?Encouraged on going compliance with current medication regimen. I recommend eating a heart-healthy diet; low in fat and cholesterol along with regular exercise.   ? Relevant Medications  ? rosuvastatin (CRESTOR) 20 MG tablet  ?  lisinopril (ZESTRIL) 40 MG tablet  ? ?Other Visit Diagnoses   ? ? Health care maintenance      ? Relevant Orders  ? POCT glycosylated hemoglobin (Hb A1C) (Completed)  ? POCT URINALYSIS DIP (CLINITEK) (Completed)  ? Uncontrolled type 2 diabetes mellitus with hyperglycemia ([place])     ?Encourage compliance with current treatment regimen  Dose adjustment we will restart Trulicity ?Encourage regular CBG monitoring ?Encourage contacting office if excessive hyperglycemia and or hypoglycemia ?Lifestyle modification with healthy diet (fewer calories, more high fiber foods, whole grains and non-starchy vegetables, lower fat meat and fish, low-fat diary include healthy oils) regular exercise (physical activity)  ?  ? Relevant Medications  ? rosuvastatin (CRESTOR) 20 MG tablet  ? glipiZIDE (GLUCOTROL XL) 10 MG 24 hr tablet  ? lisinopril (ZESTRIL) 40 MG tablet  ? empagliflozin (JARDIANCE) 25 MG TABS tablet  ? Dulaglutide (TRULICITY) 1.5 FS/1.4EL SOPN  ? Other Relevant Orders  ? POCT glycosylated hemoglobin (Hb A1C) (Completed)  ? POCT URINALYSIS DIP (CLINITEK) (Completed)  ? Microalbumin/Creatinine Ratio, Urine (Completed)  ? ?  ? ? ?Outpatient Encounter Medications as of 07/16/2021  ?Medication Sig  ? albuterol (PROVENTIL) (2.5 MG/3ML) 0.083% nebulizer solution Take 3 mLs (2.5 mg total) by nebulization every 6 (six) hours as needed for wheezing or shortness of breath.  ? albuterol (VENTOLIN HFA) 108 (90 Base) MCG/ACT inhaler Inhale 2 puffs into the lungs every 6 (six) hours as needed for wheezing or shortness of breath.  ? Blood Glucose Monitoring Suppl (FREESTYLE FREEDOM LITE) w/Device KIT check blood sugar up to 3 times a day  ? Dulaglutide (TRULICITY) 1.5 TR/3.2YE SOPN Inject 1.5 mg into the skin once a week.  ? empagliflozin (JARDIANCE) 25 MG TABS tablet Take 1 tablet (25 mg total) by mouth daily before breakfast.  ? famotidine (PEPCID) 20 MG tablet 1 tab daily before supper  ? gabapentin (NEURONTIN) 300 MG capsule Take 3  capsules 2 hours before bedtime; may take 1 capsule as needed during the day.  ? glipiZIDE (GLUCOTROL XL) 10 MG 24 hr tablet Take 1 tablet (10 mg total) by mouth daily with breakfast.  ? Insulin Pen Needle (PEN NEEDLES 3/16") 31G X 5 MM MISC Use to inject Lantus at bedtime. The patient is insulin requiring, ICD 10 code E11.65. The patient injects 1 times per day.  ? lisinopril (ZESTRIL) 40 MG tablet Take 1 tablet (40 mg total) by mouth daily.  ? montelukast (SINGULAIR) 10 MG tablet Take 1 tablet (  10 mg total) by mouth at bedtime.  ? nortriptyline (PAMELOR) 25 MG capsule Take 1 capsule (25 mg total) by mouth at bedtime.  ? rOPINIRole (REQUIP) 1 MG tablet Take 1 mg by mouth at bedtime.  ? rosuvastatin (CRESTOR) 20 MG tablet Take 1 tablet (20 mg total) by mouth daily.  ? [DISCONTINUED] benzonatate (TESSALON) 200 MG capsule TAKE 1 CAPSULE (200 MG TOTAL) BY MOUTH 3 (THREE) TIMES DAILY AS NEEDED FOR COUGH.  ? [DISCONTINUED] Fluticasone-Umeclidin-Vilant (TRELEGY ELLIPTA) 100-62.5-25 MCG/ACT AEPB Inhale 1 puff then rinse mouth, once daily  ? [DISCONTINUED] glipiZIDE (GLUCOTROL XL) 5 MG 24 hr tablet Take 5 mg by mouth every morning.  ? [DISCONTINUED] simvastatin (ZOCOR) 80 MG tablet TAKE 1 TABLET BY MOUTH BEFORE BEDTIME  ? glucose blood (FREESTYLE LITE) test strip Test blood sugar 3 times a day  ? hydrochlorothiazide (MICROZIDE) 12.5 MG capsule Take 1 capsule (12.5 mg total) by mouth daily.  ? prednisoLONE acetate (PRED FORTE) 1 % ophthalmic suspension Place 1 drop into the left eye 4 (four) times daily.  ? [DISCONTINUED] imipramine (TOFRANIL) 25 MG tablet Take 25 mg by mouth at bedtime.  ? [DISCONTINUED] JARDIANCE 10 MG TABS tablet Take 10 mg by mouth daily.  ? [DISCONTINUED] quinapril (ACCUPRIL) 40 MG tablet TAKE ONE TABLET BY MOUTH DAILY  ? ?No facility-administered encounter medications on file as of 07/16/2021.  ? ? ?Follow-up: Return in about 2 weeks (around 07/30/2021) for fasting labs within the next week.  ? ?Vevelyn Francois, NP ? ?

## 2021-07-18 LAB — MICROALBUMIN / CREATININE URINE RATIO
Creatinine, Urine: 33 mg/dL
Microalb/Creat Ratio: <9 mg/g creat (ref 0–29)
Microalbumin, Urine: <3 ug/mL

## 2021-07-19 ENCOUNTER — Encounter: Payer: Self-pay | Admitting: Nurse Practitioner

## 2021-07-21 ENCOUNTER — Telehealth: Payer: BC Managed Care – PPO | Admitting: Nurse Practitioner

## 2021-07-21 ENCOUNTER — Other Ambulatory Visit: Payer: Self-pay

## 2021-07-22 ENCOUNTER — Telehealth: Payer: Self-pay

## 2021-07-22 NOTE — Telephone Encounter (Signed)
Patient has a follow up appt with you on March 24th. ?

## 2021-07-22 NOTE — Telephone Encounter (Signed)
Patient states that she went back down to only taking 1 tablet ('10mg'$ ) and she can tolerated that dosage and yes she has started the Trulicity again as well. ?

## 2021-07-30 ENCOUNTER — Other Ambulatory Visit: Payer: Self-pay

## 2021-07-30 ENCOUNTER — Ambulatory Visit: Payer: BC Managed Care – PPO | Admitting: Nurse Practitioner

## 2021-07-30 VITALS — BP 136/76 | HR 112 | Temp 98.6°F | Ht 62.0 in | Wt 119.2 lb

## 2021-07-30 DIAGNOSIS — T7840XD Allergy, unspecified, subsequent encounter: Secondary | ICD-10-CM | POA: Diagnosis not present

## 2021-07-30 DIAGNOSIS — T148XXA Other injury of unspecified body region, initial encounter: Secondary | ICD-10-CM

## 2021-07-30 DIAGNOSIS — E1165 Type 2 diabetes mellitus with hyperglycemia: Secondary | ICD-10-CM

## 2021-07-30 DIAGNOSIS — E785 Hyperlipidemia, unspecified: Secondary | ICD-10-CM | POA: Diagnosis not present

## 2021-07-30 DIAGNOSIS — Z1329 Encounter for screening for other suspected endocrine disorder: Secondary | ICD-10-CM

## 2021-07-30 MED ORDER — TRULICITY 3 MG/0.5ML ~~LOC~~ SOAJ: 3.0000 mg | SUBCUTANEOUS | 0 refills | Status: DC

## 2021-07-30 NOTE — Patient Instructions (Signed)
Blisters, Adult  A blister is a raised bubble of skin filled with liquid. Blisters often develop on skin that rubs or presses against another surface repeatedly (friction blister). Friction blisters can occur on any part of the body, but they usually form on the hands or the feet. Long-term pressure on that same area of skin can also cause the area of skin to become hardened. This hardened skin is called acallus. What are the causes? Besides friction, blisters can be caused by: An injury, such as a burn. An allergic reaction. An infection. Exposure to irritating chemicals. Friction blisters often occur in areas with a lot of heat and moisture. Friction blisters often result from: Sports. Repetitive activities. Using tools and doing other activities without wearing gloves. Shoes that are too tight or too loose. What are the signs or symptoms? A blister is often round and looks like a bump. It may hurt or feel itchy. Before a blister forms, your skin may: Turn red. Feel warm. Itch. Be painful to the touch. How is this diagnosed? A blister is diagnosed with a physical exam. How is this treated? Treatment usually involves protecting the area where the blister has formed until your skin has healed. Treatments may include: Using a bandage (dressing) to cover your blister. Putting extra padding around and over your blister so that the blister does not rub on anything. Applying antibiotic ointment. Most blisters break open, dry up, and go away on their own within 1-2 weeks. Blisters that are very painful may be drained before they break open on their own. If your blister is large or painful, your health care provider can drainit. Follow these instructions at home: Medicines Take or apply over-the-counter and prescription medicines only as told by your health care provider. If you were prescribed an antibiotic medicine, take or apply it as told by your health care provider. Do not stop using  the antibiotic even if you start to feel better. Skin care Do not pop your blister. This can cause infection. Keep your blister clean and dry. This helps to prevent infection. Before you swim or use a hot tub, cover your blister with a waterproof dressing. Protect the area where your blister has formed as told by your health care provider. Follow instructions from your health care provider about how to take care of your blister. Make sure you: Wash your hands with soap and water for at least 20 seconds before and after you change your dressing. If soap and water are not available, use hand sanitizer. Change your dressing as told by your health care provider. Infection signs Check your blister every day for signs of infection. Check for: More redness, swelling, or pain. More fluid or blood. Warmth. Pus or a bad smell. General instructions If you have a blister on a foot or toe, wear different shoes until your blister heals. Avoid the activity that caused the blister until your blister heals. Keep all follow-up visits as told by your health care provider. This is important. How is this prevented? Taking these steps can help to prevent blisters that are caused by friction. Make sure you: Wear comfortable shoes that fit well. Always wear socks with shoes. Wear extra socks or use tape, dressings, or pads over blister-prone areas as needed. You may also apply petroleum jelly under dressings in blister-prone areas. Wear protective gear, such as gloves, when taking part in sports or activities that can cause blisters. Wear loose-fitting, moisture-wicking clothes when taking part in sports or activities.   Use powders as needed to keep your feet dry. Contact a health care provider if: You have more redness, swelling, or pain around your blister. You have more fluid or blood coming from your blister. Your blister feels warm to the touch. You have pus or a bad smell coming from your blister. You  have a fever or chills. Your blister gets better and then it gets worse. Summary A blister is a raised bubble of skin filled with liquid. Blisters often develop on skin that rubs or presses against another surface repeatedly (friction blister). Most blisters break open, dry up, and go away on their own within 1-2 weeks. Keep your blister clean and dry. This helps to prevent infection. Take steps to help prevent blisters that are caused by friction. Contact a health care provider if you have signs of infection. This information is not intended to replace advice given to you by your health care provider. Make sure you discuss any questions you have with your healthcare provider. Document Revised: 03/14/2019 Document Reviewed: 03/14/2019 Elsevier Patient Education  2022 Elsevier Inc.  

## 2021-07-30 NOTE — Progress Notes (Signed)
? ?[place] ?[place], [place]  [address] ?Phone:  [phone]   Fax:  [phone] ? ? ? ?Established Patient Office Visit ? ?Subjective:  ?Patient ID: Brooke Floyd, female    DOB: 1965/11/06  Age: 56 y.o. MRN: [id number] ? ?CC:  ?Chief Complaint  ?Patient presents with  ? Follow-up  ?  Patient is here for her follow up visit and to discuss the bad reaction she had over the weekend due to taking her husbands Diclofenac medication by accident. Patient was seen in urgent care due to developing big blisters on her right thigh and both arms.  ? ? ?HPI ?Brooke Floyd presents for follow up. She  has a past medical history of Asthma, GERD (gastroesophageal reflux disease), Hyperlipidemia, Hypertension, Thrombocytosis (02/08/2017), and Type 2 diabetes mellitus ([place]).  ? ?She is in today for a follow up. She  has a past medical history of Asthma, GERD (gastroesophageal reflux disease), Hyperlipidemia, Hypertension, Thrombocytosis (02/08/2017), and Type 2 diabetes mellitus ([place]).  ? ?She had a allergic reaction from taking Diclofenac 75 mg . This was accidental. She has a known reaction in the past and this was her husbands medication. She was then prescribed more Diclofenac @ [facility] along with prednisone and benadryl. She did take the Diclofenac to make sure that this was what caused the reaction. She has blisters on arms and legs. The blisters have burst and she is keeping them wrapped.  ? ?She reports that her blood glucose has been running good until recently.  [phone]. She has had elevation since bing on prednisone.  ? ?She reports that she was itching with the Jardaince. She has restarted the Trucility. She is doing well ? ?She has been taking acid reflux medication.  ? ?She is in pain management due to a left arm rotator tear. She is   ? ?Past Medical History:  ?Diagnosis Date  ? Asthma   ? GERD (gastroesophageal reflux disease)   ? Hyperlipidemia   ? Hypertension    ? Thrombocytosis 02/08/2017  ? Type 2 diabetes mellitus ([place])   ? insulin dependent  ? ? ?Past Surgical History:  ?Procedure Laterality Date  ? COMBINED HYSTERECTOMY ABDOMINAL W/ A&P REPAIR / OOPHORECTOMY    ? WRIST ARTHROSCOPY    ? ? ?Family History  ?Problem Relation Age of Onset  ? Coronary artery disease Father   ? Diabetes Father   ? Diabetes Mother   ? Breast cancer Neg Hx   ? Colon cancer Neg Hx   ? ? ?Social History  ? ?Socioeconomic History  ? Marital status: Married  ?  Spouse name: Not on file  ? Number of children: 2  ? Years of education: 71  ? Highest education level: Not on file  ?Occupational History  ? Occupation: CNA  ?Tobacco Use  ? Smoking status: Never  ? Smokeless tobacco: Never  ?Vaping Use  ? Vaping Use: Never used  ?Substance and Sexual Activity  ? Alcohol use: No  ?  Alcohol/week: 0.0 standard drinks  ? Drug use: No  ? Sexual activity: Not Currently  ?  Birth control/protection: Surgical  ?Other Topics Concern  ? Not on file  ?Social History Narrative  ? Not on file  ? ?Social Determinants of Health  ? ?Financial Resource Strain: Not on file  ?Food Insecurity: Not on file  ?Transportation Needs: Not on file  ?Physical Activity: Not on file  ?Stress: Not on file  ?Social  Connections: Not on file  ?Intimate Partner Violence: Not on file  ? ? ?Outpatient Medications Prior to Visit  ?Medication Sig Dispense Refill  ? albuterol (PROVENTIL) (2.5 MG/3ML) 0.083% nebulizer solution Take 3 mLs (2.5 mg total) by nebulization every 6 (six) hours as needed for wheezing or shortness of breath. 75 mL 12  ? albuterol (VENTOLIN HFA) 108 (90 Base) MCG/ACT inhaler Inhale 2 puffs into the lungs every 6 (six) hours as needed for wheezing or shortness of breath. 18 g 12  ? Blood Glucose Monitoring Suppl (FREESTYLE FREEDOM LITE) w/Device KIT check blood sugar up to 3 times a day 1 each 1  ? empagliflozin (JARDIANCE) 25 MG TABS tablet Take 1 tablet (25 mg total) by mouth daily before breakfast. 30 tablet 2  ?  famotidine (PEPCID) 20 MG tablet 1 tab daily before supper 30 tablet 12  ? gabapentin (NEURONTIN) 300 MG capsule Take 3 capsules 2 hours before bedtime; may take 1 capsule as needed during the day. 120 capsule 5  ? glipiZIDE (GLUCOTROL XL) 10 MG 24 hr tablet Take 1 tablet (10 mg total) by mouth daily with breakfast. 30 tablet 2  ? glucose blood (FREESTYLE LITE) test strip Test blood sugar 3 times a day 100 each 12  ? hydrochlorothiazide (MICROZIDE) 12.5 MG capsule Take 1 capsule (12.5 mg total) by mouth daily. 90 capsule 3  ? Insulin Pen Needle (PEN NEEDLES 3/16") 31G X 5 MM MISC Use to inject Lantus at bedtime. The patient is insulin requiring, ICD 10 code E11.65. The patient injects 1 times per day. 100 each 6  ? lisinopril (ZESTRIL) 40 MG tablet Take 1 tablet (40 mg total) by mouth daily. 30 tablet 2  ? montelukast (SINGULAIR) 10 MG tablet Take 1 tablet (10 mg total) by mouth at bedtime. 90 tablet 3  ? nortriptyline (PAMELOR) 25 MG capsule Take 1 capsule (25 mg total) by mouth at bedtime. 30 capsule 2  ? prednisoLONE acetate (PRED FORTE) 1 % ophthalmic suspension Place 1 drop into the left eye 4 (four) times daily.    ? rOPINIRole (REQUIP) 1 MG tablet Take 1 mg by mouth at bedtime.    ? rosuvastatin (CRESTOR) 20 MG tablet Take 1 tablet (20 mg total) by mouth daily. 30 tablet 2  ? Dulaglutide (TRULICITY) 1.5 UE/2.8MK SOPN Inject 1.5 mg into the skin once a week. 2 mL 0  ? ?No facility-administered medications prior to visit.  ? ? ?Allergies  ?Allergen Reactions  ? Diclofenac Sodium Anaphylaxis  ? Voltaren [Diclofenac] Anaphylaxis  ? Hydrocodone Itching  ? Penicillins Other (See Comments)  ?  convulsions  ? Codeine Itching  ? ? ?ROS ?Review of Systems ? ?  ?Objective:  ?  ?Physical Exam ?Constitutional:   ?   General: She is in acute distress.  ?Cardiovascular:  ?   Rate and Rhythm: Regular rhythm. Tachycardia present.  ?   Pulses: Normal pulses.  ?   Heart sounds: Normal heart sounds.  ?Pulmonary:  ?   Effort:  Pulmonary effort is normal.  ?   Breath sounds: Normal breath sounds.  ?Musculoskeletal:  ?   Cervical back: Normal range of motion.  ?Skin: ?   General: Skin is warm.  ?   Capillary Refill: Capillary refill takes less than 2 seconds.  ?   Comments: Blisters to arms and legs with serosanguinous to clear drainage  ?Neurological:  ?   General: No focal deficit present.  ?   Mental Status: She is alert.  ? ? ?  BP 136/76   Pulse (!) 112   Temp 98.6 ?F (37 ?C)   Ht 5' 2"  (1.575 m)   Wt 54.1 kg   SpO2 100%   BMI 21.80 kg/m?  ?Wt Readings from Last 3 Encounters:  ?07/30/21 54.1 kg  ?07/16/21 54.3 kg  ?04/28/21 57.2 kg  ? ? ? ?Health Maintenance Due  ?Topic Date Due  ? Hepatitis C Screening  Never done  ? OPHTHALMOLOGY EXAM  12/03/2016  ? FOOT EXAM  02/08/2018  ? ? ?There are no preventive care reminders to display for this patient. ? ?Lab Results  ?Component Value Date  ? TSH 2.470 08/20/2021  ? ?Lab Results  ?Component Value Date  ? WBC 3.3 (L) 08/20/2021  ? HGB 11.4 08/20/2021  ? HCT 35.2 08/20/2021  ? MCV 92 08/20/2021  ? PLT 402 08/20/2021  ? ?Lab Results  ?Component Value Date  ? NA 138 08/20/2021  ? K 5.2 08/20/2021  ? CO2 26 08/20/2021  ? GLUCOSE 241 (H) 08/20/2021  ? BUN 22 08/20/2021  ? CREATININE 0.99 08/20/2021  ? BILITOT 0.2 08/20/2021  ? ALKPHOS 66 08/20/2021  ? AST 27 08/20/2021  ? ALT 23 08/20/2021  ? PROT 6.9 08/20/2021  ? ALBUMIN 4.7 08/20/2021  ? CALCIUM 10.1 08/20/2021  ? EGFR 67 08/20/2021  ? ?Lab Results  ?Component Value Date  ? CHOL 220 (H) 08/20/2021  ? ?Lab Results  ?Component Value Date  ? HDL 55 08/20/2021  ? ?Lab Results  ?Component Value Date  ? LDLCALC 127 (H) 08/20/2021  ? ?Lab Results  ?Component Value Date  ? TRIG 216 (H) 08/20/2021  ? ?Lab Results  ?Component Value Date  ? CHOLHDL 4.0 08/20/2021  ? ?Lab Results  ?Component Value Date  ? HGBA1C 11.5 (A) 07/16/2021  ? HGBA1C 11.5 07/16/2021  ? HGBA1C 11.5 (A) 07/16/2021  ? HGBA1C 11.5 (A) 07/16/2021  ? ? ?  ?Assessment & Plan:   ? ?Problem List Items Addressed This Visit   ? ?  ? Other  ? Hyperlipidemia (Chronic) ?Stable  ?Encouraged on going compliance with current medication regimen. I recommend eating a heart-healthy diet; low in fat and cho

## 2021-07-31 LAB — COMP. METABOLIC PANEL (12)
AST: 37 IU/L (ref 0–40)
Albumin/Globulin Ratio: 1.9 (ref 1.2–2.2)
Albumin: 4.7 g/dL (ref 3.8–4.9)
Alkaline Phosphatase: 75 IU/L (ref 44–121)
BUN/Creatinine Ratio: 32 — ABNORMAL HIGH (ref 9–23)
BUN: 40 mg/dL — ABNORMAL HIGH (ref 6–24)
Bilirubin Total: 0.2 mg/dL (ref 0.0–1.2)
Calcium: 9.7 mg/dL (ref 8.7–10.2)
Chloride: 99 mmol/L (ref 96–106)
Creatinine, Ser: 1.26 mg/dL — ABNORMAL HIGH (ref 0.57–1.00)
Globulin, Total: 2.5 g/dL (ref 1.5–4.5)
Glucose: 245 mg/dL — ABNORMAL HIGH (ref 70–99)
Potassium: 6.2 mmol/L — ABNORMAL HIGH (ref 3.5–5.2)
Sodium: 137 mmol/L (ref 134–144)
Total Protein: 7.2 g/dL (ref 6.0–8.5)
eGFR: 50 mL/min/{1.73_m2} — ABNORMAL LOW (ref >59)

## 2021-07-31 LAB — CBC WITH DIFFERENTIAL/PLATELET
Basophils Absolute: 0 10*3/uL (ref 0.0–0.2)
Basos: 0 %
EOS (ABSOLUTE): 0 10*3/uL (ref 0.0–0.4)
Eos: 0 %
Hematocrit: 33.1 % — ABNORMAL LOW (ref 34.0–46.6)
Hemoglobin: 10.6 g/dL — ABNORMAL LOW (ref 11.1–15.9)
Immature Grans (Abs): 0 10*3/uL (ref 0.0–0.1)
Immature Granulocytes: 0 %
Lymphocytes Absolute: 0.4 10*3/uL — ABNORMAL LOW (ref 0.7–3.1)
Lymphs: 4 %
MCH: 29.4 pg (ref 26.6–33.0)
MCHC: 32 g/dL (ref 31.5–35.7)
MCV: 92 fL (ref 79–97)
Monocytes Absolute: 0.1 10*3/uL (ref 0.1–0.9)
Monocytes: 1 %
Neutrophils Absolute: 9.3 10*3/uL — ABNORMAL HIGH (ref 1.4–7.0)
Neutrophils: 95 %
Platelets: 562 10*3/uL — ABNORMAL HIGH (ref 150–450)
RBC: 3.6 x10E6/uL — ABNORMAL LOW (ref 3.77–5.28)
RDW: 11.7 % (ref 11.7–15.4)
WBC: 9.8 10*3/uL (ref 3.4–10.8)

## 2021-07-31 LAB — LIPID PANEL
Chol/HDL Ratio: 2.9 ratio (ref 0.0–4.4)
Cholesterol, Total: 186 mg/dL (ref 100–199)
HDL: 65 mg/dL (ref >39)
LDL Chol Calc (NIH): 104 mg/dL — ABNORMAL HIGH (ref 0–99)
Triglycerides: 96 mg/dL (ref 0–149)
VLDL Cholesterol Cal: 17 mg/dL (ref 5–40)

## 2021-07-31 LAB — TSH: TSH: 0.693 u[IU]/mL (ref 0.450–4.500)

## 2021-08-13 ENCOUNTER — Ambulatory Visit: Payer: BC Managed Care – PPO | Admitting: Physician Assistant

## 2021-08-15 ENCOUNTER — Encounter: Payer: Self-pay | Admitting: Nurse Practitioner

## 2021-08-20 ENCOUNTER — Other Ambulatory Visit: Payer: BC Managed Care – PPO

## 2021-08-20 DIAGNOSIS — Z1329 Encounter for screening for other suspected endocrine disorder: Secondary | ICD-10-CM

## 2021-08-20 DIAGNOSIS — E1165 Type 2 diabetes mellitus with hyperglycemia: Secondary | ICD-10-CM

## 2021-08-20 DIAGNOSIS — E785 Hyperlipidemia, unspecified: Secondary | ICD-10-CM

## 2021-08-21 LAB — CBC WITH DIFFERENTIAL/PLATELET
Basophils Absolute: 0 10*3/uL (ref 0.0–0.2)
Basos: 1 %
EOS (ABSOLUTE): 0.2 10*3/uL (ref 0.0–0.4)
Eos: 6 %
Hematocrit: 35.2 % (ref 34.0–46.6)
Hemoglobin: 11.4 g/dL (ref 11.1–15.9)
Immature Grans (Abs): 0 10*3/uL (ref 0.0–0.1)
Immature Granulocytes: 0 %
Lymphocytes Absolute: 1 10*3/uL (ref 0.7–3.1)
Lymphs: 31 %
MCH: 29.8 pg (ref 26.6–33.0)
MCHC: 32.4 g/dL (ref 31.5–35.7)
MCV: 92 fL (ref 79–97)
Monocytes Absolute: 0.3 10*3/uL (ref 0.1–0.9)
Monocytes: 9 %
Neutrophils Absolute: 1.8 10*3/uL (ref 1.4–7.0)
Neutrophils: 53 %
Platelets: 402 10*3/uL (ref 150–450)
RBC: 3.82 x10E6/uL (ref 3.77–5.28)
RDW: 11.8 % (ref 11.7–15.4)
WBC: 3.3 10*3/uL — ABNORMAL LOW (ref 3.4–10.8)

## 2021-08-21 LAB — COMPREHENSIVE METABOLIC PANEL
ALT: 23 IU/L (ref 0–32)
AST: 27 IU/L (ref 0–40)
Albumin/Globulin Ratio: 2.1 (ref 1.2–2.2)
Albumin: 4.7 g/dL (ref 3.8–4.9)
Alkaline Phosphatase: 66 IU/L (ref 44–121)
BUN/Creatinine Ratio: 22 (ref 9–23)
BUN: 22 mg/dL (ref 6–24)
Bilirubin Total: 0.2 mg/dL (ref 0.0–1.2)
CO2: 26 mmol/L (ref 20–29)
Calcium: 10.1 mg/dL (ref 8.7–10.2)
Chloride: 98 mmol/L (ref 96–106)
Creatinine, Ser: 0.99 mg/dL (ref 0.57–1.00)
Globulin, Total: 2.2 g/dL (ref 1.5–4.5)
Glucose: 241 mg/dL — ABNORMAL HIGH (ref 70–99)
Potassium: 5.2 mmol/L (ref 3.5–5.2)
Sodium: 138 mmol/L (ref 134–144)
Total Protein: 6.9 g/dL (ref 6.0–8.5)
eGFR: 67 mL/min/{1.73_m2} (ref >59)

## 2021-08-21 LAB — LIPID PANEL
Chol/HDL Ratio: 4 ratio (ref 0.0–4.4)
Cholesterol, Total: 220 mg/dL — ABNORMAL HIGH (ref 100–199)
HDL: 55 mg/dL (ref >39)
LDL Chol Calc (NIH): 127 mg/dL — ABNORMAL HIGH (ref 0–99)
Triglycerides: 216 mg/dL — ABNORMAL HIGH (ref 0–149)
VLDL Cholesterol Cal: 38 mg/dL (ref 5–40)

## 2021-08-21 LAB — TSH: TSH: 2.47 u[IU]/mL (ref 0.450–4.500)

## 2021-09-01 ENCOUNTER — Other Ambulatory Visit: Payer: BC Managed Care – PPO | Admitting: Nurse Practitioner

## 2021-09-29 ENCOUNTER — Other Ambulatory Visit: Payer: Self-pay | Admitting: Nurse Practitioner

## 2021-09-29 DIAGNOSIS — E1165 Type 2 diabetes mellitus with hyperglycemia: Secondary | ICD-10-CM

## 2021-10-29 ENCOUNTER — Other Ambulatory Visit: Payer: Self-pay | Admitting: Nurse Practitioner

## 2021-10-29 DIAGNOSIS — E1165 Type 2 diabetes mellitus with hyperglycemia: Secondary | ICD-10-CM

## 2021-10-29 DIAGNOSIS — E785 Hyperlipidemia, unspecified: Secondary | ICD-10-CM

## 2021-11-04 ENCOUNTER — Other Ambulatory Visit: Payer: Self-pay | Admitting: Nurse Practitioner

## 2021-11-04 ENCOUNTER — Ambulatory Visit
Admission: RE | Admit: 2021-11-04 | Discharge: 2021-11-04 | Disposition: A | Discharge status: Home / Self Care | Payer: BC Managed Care – PPO | Source: Ambulatory Visit

## 2021-11-04 ENCOUNTER — Other Ambulatory Visit: Payer: Self-pay

## 2021-11-04 DIAGNOSIS — M25512 Pain in left shoulder: Secondary | ICD-10-CM

## 2021-12-02 ENCOUNTER — Encounter: Payer: Self-pay | Admitting: Gastroenterology

## 2021-12-27 ENCOUNTER — Other Ambulatory Visit: Payer: Self-pay | Admitting: Nurse Practitioner

## 2021-12-27 DIAGNOSIS — E1165 Type 2 diabetes mellitus with hyperglycemia: Secondary | ICD-10-CM

## 2022-01-17 ENCOUNTER — Ambulatory Visit
Admission: RE | Admit: 2022-01-17 | Discharge: 2022-01-17 | Disposition: A | Discharge status: Home / Self Care | Payer: BC Managed Care – PPO | Source: Ambulatory Visit | Attending: Nurse Practitioner | Admitting: Nurse Practitioner

## 2022-01-17 ENCOUNTER — Other Ambulatory Visit: Payer: Self-pay | Admitting: Nurse Practitioner

## 2022-01-17 DIAGNOSIS — M542 Cervicalgia: Secondary | ICD-10-CM | POA: Insufficient documentation

## 2022-01-17 DIAGNOSIS — M25512 Pain in left shoulder: Secondary | ICD-10-CM

## 2022-01-28 ENCOUNTER — Other Ambulatory Visit: Payer: Self-pay | Admitting: Nurse Practitioner

## 2022-01-28 DIAGNOSIS — E1165 Type 2 diabetes mellitus with hyperglycemia: Secondary | ICD-10-CM

## 2022-01-28 DIAGNOSIS — E785 Hyperlipidemia, unspecified: Secondary | ICD-10-CM

## 2022-03-26 ENCOUNTER — Other Ambulatory Visit: Payer: Self-pay | Admitting: Nurse Practitioner

## 2022-03-26 DIAGNOSIS — E1165 Type 2 diabetes mellitus with hyperglycemia: Secondary | ICD-10-CM

## 2022-04-05 ENCOUNTER — Other Ambulatory Visit: Payer: Self-pay | Admitting: Nurse Practitioner

## 2022-04-05 DIAGNOSIS — Z1231 Encounter for screening mammogram for malignant neoplasm of breast: Secondary | ICD-10-CM

## 2022-04-26 ENCOUNTER — Inpatient Hospital Stay: Admission: RE | Admit: 2022-04-26 | Payer: Commercial Managed Care - HMO | Source: Ambulatory Visit

## 2022-04-28 ENCOUNTER — Ambulatory Visit: Payer: Commercial Managed Care - HMO

## 2022-04-28 ENCOUNTER — Ambulatory Visit
Admission: RE | Admit: 2022-04-28 | Discharge: 2022-04-28 | Disposition: A | Discharge status: Home / Self Care | Payer: BC Managed Care – PPO | Source: Ambulatory Visit | Attending: Nurse Practitioner | Admitting: Nurse Practitioner

## 2022-04-28 DIAGNOSIS — Z1231 Encounter for screening mammogram for malignant neoplasm of breast: Secondary | ICD-10-CM

## 2022-05-10 ENCOUNTER — Other Ambulatory Visit (HOSPITAL_COMMUNITY): Payer: Self-pay

## 2022-05-30 ENCOUNTER — Telehealth: Payer: Self-pay | Admitting: Internal Medicine

## 2022-05-30 NOTE — Telephone Encounter (Signed)
Spoke with pt who states she feels terrible. She reports having headache, cough, ear pain, slightly increased SOB and fatigue. Pt denies fever/ GI upset/ wheezing. Pt states she has tested negative for Covid but has not been tested for the flu. Pt was scheduled for an acute visit with Roxan Diesel tomorrow at 11:30 am. Pt advise if symptoms get worse she should go to UC or ED for evaluation. Nothing further needed at this time.   Routing to Dr. Annamaria Boots and Joellen Jersey as Juluis Rainier

## 2022-05-30 NOTE — Telephone Encounter (Signed)
Calling again. Pls call. Sounds very bad like windpipe restricted. Thanks you.

## 2022-05-30 NOTE — Telephone Encounter (Signed)
PT calling for appt w/Dr. Cecil Cobbs. No appts avail. Feels bad and Theraflu is not helping. Has cough, headache and symptoms include a blocked ear. 2 days. Neg for Covid. This happens every year w/ her Asthma. Also, ran out of her inhaler.   Pls. call [phone]

## 2022-05-31 ENCOUNTER — Ambulatory Visit: Payer: BC Managed Care – PPO | Admitting: Nurse Practitioner

## 2022-06-06 LAB — HM DIABETES EYE EXAM

## 2022-07-23 ENCOUNTER — Other Ambulatory Visit: Payer: Self-pay | Admitting: Nurse Practitioner

## 2022-07-25 NOTE — Telephone Encounter (Signed)
Pt has snot been in for a while . Mychart message was sent for her to make an appointment . Please advise if either of you will fill . Thanks [employer]

## 2022-07-25 NOTE — Telephone Encounter (Signed)
Will deny refill until we hear back from patient. Please let me know if she scheduled appointment.

## 2022-08-18 ENCOUNTER — Ambulatory Visit (HOSPITAL_BASED_OUTPATIENT_CLINIC_OR_DEPARTMENT_OTHER): Payer: BC Managed Care – PPO

## 2022-08-18 ENCOUNTER — Encounter (HOSPITAL_BASED_OUTPATIENT_CLINIC_OR_DEPARTMENT_OTHER): Payer: Self-pay | Admitting: Student

## 2022-08-18 ENCOUNTER — Ambulatory Visit (INDEPENDENT_AMBULATORY_CARE_PROVIDER_SITE_OTHER): Payer: BC Managed Care – PPO | Admitting: Student

## 2022-08-18 DIAGNOSIS — M25512 Pain in left shoulder: Secondary | ICD-10-CM | POA: Diagnosis not present

## 2022-08-18 DIAGNOSIS — G8929 Other chronic pain: Secondary | ICD-10-CM

## 2022-08-18 MED ORDER — TRIAMCINOLONE ACETONIDE 40 MG/ML IJ SUSP
2.0000 mL | INTRAMUSCULAR | Status: AC | PRN
Start: 2022-08-18 — End: 2022-08-18
  Administered 2022-08-18: 2 mL via INTRA_ARTICULAR

## 2022-08-18 MED ORDER — LIDOCAINE HCL 1 % IJ SOLN
4.0000 mL | INTRAMUSCULAR | Status: AC | PRN
Start: 2022-08-18 — End: 2022-08-18
  Administered 2022-08-18: 4 mL

## 2022-08-18 NOTE — Progress Notes (Signed)
Chief Complaint: Left shoulder pain     History of Present Illness:    DRUSCILLA Floyd is a 57 y.o. female presenting to clinic for evaluation of left shoulder pain.  She states that a lot of these issues began last July when someone's body weight fell directly onto her left shoulder.  This has been slowly worsening over time and states that the pain was very severe this morning prompting her to be seen.  She has had both a glenohumeral and subacromial injection which did give her some relief but only for 1 to 2 weeks.  She has also tried working with physical therapy however was only able to go for 2 visits due to her significant pain.  She has tried taking Tylenol arthritis as well as using a topical liniment cream which has been of little benefit.  Denies any other pain medications.  Denies any radiating numbness or tingling.   Surgical History:   None  PMH/PSH/Family History/Social History/Meds/Allergies:    Past Medical History:  Diagnosis Date   Asthma    GERD (gastroesophageal reflux disease)    Hyperlipidemia    Hypertension    Thrombocytosis 02/08/2017   Type 2 diabetes mellitus    insulin dependent   Past Surgical History:  Procedure Laterality Date   COMBINED HYSTERECTOMY ABDOMINAL W/ A&P REPAIR / OOPHORECTOMY     WRIST ARTHROSCOPY     Social History   Socioeconomic History   Marital status: Married    Spouse name: Not on file   Number of children: 2   Years of education: 14   Highest education level: Not on file  Occupational History   Occupation: CNA  Tobacco Use   Smoking status: Never   Smokeless tobacco: Never  Vaping Use   Vaping Use: Never used  Substance and Sexual Activity   Alcohol use: No    Alcohol/week: 0.0 standard drinks of alcohol   Drug use: No   Sexual activity: Not Currently    Birth control/protection: Surgical  Other Topics Concern   Not on file  Social History Narrative   Not on file    Social Determinants of Health   Financial Resource Strain: Not on file  Food Insecurity: Not on file  Transportation Needs: Not on file  Physical Activity: Not on file  Stress: Not on file  Social Connections: Not on file   Family History  Problem Relation Age of Onset   Coronary artery disease Father    Diabetes Father    Diabetes Mother    Breast cancer Neg Hx    Colon cancer Neg Hx    Allergies  Allergen Reactions   Diclofenac Sodium Anaphylaxis   Voltaren [Diclofenac] Anaphylaxis   Hydrocodone Itching   Penicillins Other (See Comments)    convulsions   Codeine Itching   Current Outpatient Medications  Medication Sig Dispense Refill   albuterol (PROVENTIL) (2.5 MG/3ML) 0.083% nebulizer solution Take 3 mLs (2.5 mg total) by nebulization every 6 (six) hours as needed for wheezing or shortness of breath. 75 mL 12   albuterol (VENTOLIN HFA) 108 (90 Base) MCG/ACT inhaler Inhale 2 puffs into the lungs every 6 (six) hours as needed for wheezing or shortness of breath. 18 g 12   Blood Glucose Monitoring Suppl (FREESTYLE FREEDOM LITE) w/Device KIT check blood sugar  up to 3 times a day 1 each 1   empagliflozin (JARDIANCE) 25 MG TABS tablet Take 1 tablet (25 mg total) by mouth daily before breakfast. 30 tablet 2   famotidine (PEPCID) 20 MG tablet 1 tab daily before supper 30 tablet 12   gabapentin (NEURONTIN) 300 MG capsule Take 3 capsules 2 hours before bedtime; may take 1 capsule as needed during the day. 120 capsule 5   glipiZIDE (GLUCOTROL XL) 10 MG 24 hr tablet Take 1 tablet (10 mg total) by mouth daily with breakfast. 30 tablet 2   glucose blood (FREESTYLE LITE) test strip Test blood sugar 3 times a day 100 each 12   hydrochlorothiazide (MICROZIDE) 12.5 MG capsule Take 1 capsule (12.5 mg total) by mouth daily. 90 capsule 3   Insulin Pen Needle (PEN NEEDLES 3/16") 31G X 5 MM MISC Use to inject Lantus at bedtime. The patient is insulin requiring, ICD 10 code E11.65. The patient  injects 1 times per day. 100 each 6   lisinopril (ZESTRIL) 40 MG tablet TAKE 1 TABLET (40 MG TOTAL) BY MOUTH DAILY (AM) 30 tablet 2   montelukast (SINGULAIR) 10 MG tablet Take 1 tablet (10 mg total) by mouth at bedtime. 90 tablet 3   nortriptyline (PAMELOR) 25 MG capsule Take 1 capsule (25 mg total) by mouth at bedtime. 30 capsule 2   prednisoLONE acetate (PRED FORTE) 1 % ophthalmic suspension Place 1 drop into the left eye 4 (four) times daily.     rOPINIRole (REQUIP) 1 MG tablet Take 1 mg by mouth at bedtime.     rosuvastatin (CRESTOR) 20 MG tablet TAKE 1 TABLET (20 MG TOTAL) BY MOUTH DAILY. 30 tablet 2   No current facility-administered medications for this visit.   No results found.  Review of Systems:   A ROS was performed including pertinent positives and negatives as documented in the HPI.  Physical Exam :   Constitutional: NAD and appears stated age Neurological: Alert and oriented Psych: Appropriate affect and cooperative There were no vitals taken for this visit.   Comprehensive Musculoskeletal Exam:    Left shoulder is without any obvious deformity or erythema.  Mild tenderness to palpation noted over the lateral shoulder as well as tightness throughout the deltoid.  No obvious crepitus with motion.  Patient is only able to actively forward elevate to about 60 degrees which elicits significant pain.  Passive external rotation to 30 degrees with minimal discomfort.  Unable to perform other special testing due to pain. Distal neurosensory exam intact.  Imaging:   Xray (Right shoulder 3 views): Significant subacromial calcification   I personally reviewed and interpreted the radiographs.   Assessment:   57 y.o. female presenting for evaluation of ongoing left shoulder pain that has been worse since this morning.  She does have a known calcification in the subacromial space which appears to have worsened since her last x-ray 9 months ago.  She has previously tried physical  therapy as well as multiple injections.  At this point I recommend obtaining an MRI of the shoulder to further assess the integrity of her rotator cuff and other soft tissues.  I also offered a repeat ultrasound-guided subacromial injection for potential pain relief in the meantime and patient would like to proceed with this today.  Procedure was performed in clinic without any complications and patient tolerated well.  Will plan on seeing her back in about 4 weeks for MRI follow-up.  Plan :    -Return to clinic  in 4 weeks for MRI review.     Procedure Note  Patient: Allen NorrisOinetta M Bomkamp             Date of Birth: 26-Feb-1966           MRN: [id number]             Visit Date: 08/18/2022  Procedures: Visit Diagnoses:  1. Chronic left shoulder pain     Large Joint Inj: L subacromial bursa on 08/18/2022 11:52 AM Indications: pain Details: 22 G 1.5 in needle, ultrasound-guided anterior approach Medications: 4 mL lidocaine 1 %; 2 mL triamcinolone acetonide 40 MG/ML Outcome: tolerated well, no immediate complications Consent was given by the patient. Patient was prepped and draped in the usual sterile fashion.      I personally saw and evaluated the patient, and participated in the management and treatment plan.  Hazle NordmannJackson Kayleann Mccaffery, PA-C Orthopedics  This document was dictated using Conservation officer, historic buildingsDragon voice recognition software. A reasonable attempt at proof reading has been made to minimize errors.

## 2022-09-02 ENCOUNTER — Encounter: Payer: Self-pay | Admitting: Gastroenterology

## 2022-09-07 ENCOUNTER — Ambulatory Visit
Admission: RE | Admit: 2022-09-07 | Discharge: 2022-09-07 | Disposition: A | Discharge status: Home / Self Care | Payer: BC Managed Care – PPO | Source: Ambulatory Visit | Attending: Student | Admitting: Student

## 2022-09-07 DIAGNOSIS — G8929 Other chronic pain: Secondary | ICD-10-CM

## 2022-09-16 ENCOUNTER — Ambulatory Visit (INDEPENDENT_AMBULATORY_CARE_PROVIDER_SITE_OTHER): Payer: BC Managed Care – PPO | Admitting: Orthopaedic Surgery

## 2022-09-16 DIAGNOSIS — M7502 Adhesive capsulitis of left shoulder: Secondary | ICD-10-CM

## 2022-09-16 DIAGNOSIS — M7582 Other shoulder lesions, left shoulder: Secondary | ICD-10-CM

## 2022-09-16 MED ORDER — TRIAMCINOLONE ACETONIDE 40 MG/ML IJ SUSP
80.0000 mg | INTRAMUSCULAR | Status: AC | PRN
Start: 2022-09-16 — End: 2022-09-16
  Administered 2022-09-16: 80 mg via INTRA_ARTICULAR

## 2022-09-16 MED ORDER — LIDOCAINE HCL 1 % IJ SOLN
4.0000 mL | INTRAMUSCULAR | Status: AC | PRN
Start: 2022-09-16 — End: 2022-09-16
  Administered 2022-09-16: 4 mL

## 2022-09-16 NOTE — Progress Notes (Signed)
Chief Complaint: Left shoulder pain        History of Present Illness:    09/16/2022: Presents today for follow-up of her MRI as well as injection follow-up.  She did get significant relief from the injection although she is here today for further discussion.   Brooke Floyd is a 57 y.o. female presenting to clinic for evaluation of left shoulder pain.  She states that a lot of these issues began last July when someone's body weight fell directly onto her left shoulder.  This has been slowly worsening over time and states that the pain was very severe this morning prompting her to be seen.  She has had both a glenohumeral and subacromial injection which did give her some relief but only for 1 to 2 weeks.  She has also tried working with physical therapy however was only able to go for 2 visits due to her significant pain.  She has tried taking Tylenol arthritis as well as using a topical liniment cream which has been of little benefit.  Denies any other pain medications.  Denies any radiating numbness or tingling.     Surgical History:   None   PMH/PSH/Family History/Social History/Meds/Allergies:         Past Medical History:  Diagnosis Date  . Asthma    . GERD (gastroesophageal reflux disease)    . Hyperlipidemia    . Hypertension    . Thrombocytosis 02/08/2017  . Type 2 diabetes mellitus      insulin dependent         Past Surgical History:  Procedure Laterality Date  . COMBINED HYSTERECTOMY ABDOMINAL W/ A&P REPAIR / OOPHORECTOMY      . WRIST ARTHROSCOPY        Social History         Socioeconomic History  . Marital status: Married      Spouse name: Not on file  . Number of children: 2  . Years of education: 25  . Highest education level: Not on file  Occupational History  . Occupation: CNA  Tobacco Use  . Smoking status: Never  . Smokeless tobacco: Never  Vaping Use  . Vaping Use: Never used  Substance and Sexual Activity  . Alcohol use: No       Alcohol/week: 0.0 standard drinks of alcohol  . Drug use: No  . Sexual activity: Not Currently      Birth control/protection: Surgical  Other Topics Concern  . Not on file  Social History Narrative  . Not on file    Social Determinants of Health    Financial Resource Strain: Not on file  Food Insecurity: Not on file  Transportation Needs: Not on file  Physical Activity: Not on file  Stress: Not on file  Social Connections: Not on file         Family History  Problem Relation Age of Onset  . Coronary artery disease Father    . Diabetes Father    . Diabetes Mother    . Breast cancer Neg Hx    . Colon cancer Neg Hx           Allergies  Allergen Reactions  . Diclofenac Sodium Anaphylaxis  . Voltaren [Diclofenac] Anaphylaxis  . Hydrocodone Itching  . Penicillins Other (See Comments)      convulsions  . Codeine Itching          Current Outpatient Medications  Medication Sig Dispense Refill  . albuterol (PROVENTIL) (  2.5 MG/3ML) 0.083% nebulizer solution Take 3 mLs (2.5 mg total) by nebulization every 6 (six) hours as needed for wheezing or shortness of breath. 75 mL 12  . albuterol (VENTOLIN HFA) 108 (90 Base) MCG/ACT inhaler Inhale 2 puffs into the lungs every 6 (six) hours as needed for wheezing or shortness of breath. 18 g 12  . Blood Glucose Monitoring Suppl (FREESTYLE FREEDOM LITE) w/Device KIT check blood sugar up to 3 times a day 1 each 1  . empagliflozin (JARDIANCE) 25 MG TABS tablet Take 1 tablet (25 mg total) by mouth daily before breakfast. 30 tablet 2  . famotidine (PEPCID) 20 MG tablet 1 tab daily before supper 30 tablet 12  . gabapentin (NEURONTIN) 300 MG capsule Take 3 capsules 2 hours before bedtime; may take 1 capsule as needed during the day. 120 capsule 5  . glipiZIDE (GLUCOTROL XL) 10 MG 24 hr tablet Take 1 tablet (10 mg total) by mouth daily with breakfast. 30 tablet 2  . glucose blood (FREESTYLE LITE) test strip Test blood sugar 3 times a day 100 each 12   . hydrochlorothiazide (MICROZIDE) 12.5 MG capsule Take 1 capsule (12.5 mg total) by mouth daily. 90 capsule 3  . Insulin Pen Needle (PEN NEEDLES 3/16") 31G X 5 MM MISC Use to inject Lantus at bedtime. The patient is insulin requiring, ICD 10 code E11.65. The patient injects 1 times per day. 100 each 6  . lisinopril (ZESTRIL) 40 MG tablet TAKE 1 TABLET (40 MG TOTAL) BY MOUTH DAILY (AM) 30 tablet 2  . montelukast (SINGULAIR) 10 MG tablet Take 1 tablet (10 mg total) by mouth at bedtime. 90 tablet 3  . nortriptyline (PAMELOR) 25 MG capsule Take 1 capsule (25 mg total) by mouth at bedtime. 30 capsule 2  . prednisoLONE acetate (PRED FORTE) 1 % ophthalmic suspension Place 1 drop into the left eye 4 (four) times daily.      Marland Kitchen rOPINIRole (REQUIP) 1 MG tablet Take 1 mg by mouth at bedtime.      . rosuvastatin (CRESTOR) 20 MG tablet TAKE 1 TABLET (20 MG TOTAL) BY MOUTH DAILY. 30 tablet 2    No current facility-administered medications for this visit.    Imaging Results (Last 48 hours)  No results found.     Review of Systems:   A ROS was performed including pertinent positives and negatives as documented in the HPI.   Physical Exam :   Constitutional: NAD and appears stated age Neurological: Alert and oriented Psych: Appropriate affect and cooperative There were no vitals taken for this visit.    Comprehensive Musculoskeletal Exam:     Left shoulder is without any obvious deformity or erythema.  Mild tenderness to palpation noted over the lateral shoulder as well as tightness throughout the deltoid.  No obvious crepitus with motion.  Patient is only able to actively forward elevate to about 60 degrees which elicits significant pain.  Passive external rotation to 30 degrees with minimal discomfort.  Unable to perform other special testing due to pain. Distal neurosensory exam intact.   Imaging:   Xray (Right shoulder 3 views): Significant subacromial calcification   MRI shoulder left: There is  significant inferior glenohumeral capsular thickening consistent with adhesive capsulitis   I personally reviewed and interpreted the radiographs.     Assessment:   57 y.o. female presenting for evaluation of ongoing left shoulder pain that has been worse since this morning.  I described that her MRI is consistent with adhesive capsulitis  which is consistent with her history as well.  She gets some relief from the injection but I do believe that this would be cannulized with additional glenohumeral injection.  I would like to get her going with physical therapy as well and we will plan for physical therapy referral for gentle passive range of motion about the shoulder.   Plan :     -Return to clinic in 6 weeks for follow-up -Left glenohumeral ultrasound-guided injection performed after verbal consent obtained    Procedure Note  Patient: Brooke Floyd             Date of Birth: 10/14/1965           MRN: [id number]             Visit Date: 09/16/2022  Procedures: Visit Diagnoses:  1. Rotator cuff tendinitis, left     Large Joint Inj: L glenohumeral on 09/16/2022 12:13 PM Indications: pain Details: 22 G 1.5 in needle, ultrasound-guided anterior approach  Arthrogram: No  Medications: 4 mL lidocaine 1 %; 80 mg triamcinolone acetonide 40 MG/ML Outcome: tolerated well, no immediate complications Procedure, treatment alternatives, risks and benefits explained, specific risks discussed. Consent was given by the patient. Immediately prior to procedure a time out was called to verify the correct patient, procedure, equipment, support staff and site/side marked as required. Patient was prepped and draped in the usual sterile fashion.

## 2022-10-21 ENCOUNTER — Ambulatory Visit (HOSPITAL_BASED_OUTPATIENT_CLINIC_OR_DEPARTMENT_OTHER): Payer: BC Managed Care – PPO | Admitting: Physical Therapy

## 2022-10-22 ENCOUNTER — Encounter (HOSPITAL_BASED_OUTPATIENT_CLINIC_OR_DEPARTMENT_OTHER): Payer: Self-pay | Admitting: Rehabilitative and Restorative Service Providers"

## 2022-10-22 ENCOUNTER — Other Ambulatory Visit: Payer: Self-pay

## 2022-10-22 ENCOUNTER — Ambulatory Visit (HOSPITAL_BASED_OUTPATIENT_CLINIC_OR_DEPARTMENT_OTHER)
Payer: BC Managed Care – PPO | Attending: Orthopaedic Surgery | Admitting: Rehabilitative and Restorative Service Providers"

## 2022-10-22 DIAGNOSIS — M7582 Other shoulder lesions, left shoulder: Secondary | ICD-10-CM | POA: Insufficient documentation

## 2022-10-22 DIAGNOSIS — M6281 Muscle weakness (generalized): Secondary | ICD-10-CM | POA: Diagnosis present

## 2022-10-22 DIAGNOSIS — G8929 Other chronic pain: Secondary | ICD-10-CM | POA: Insufficient documentation

## 2022-10-22 DIAGNOSIS — M25512 Pain in left shoulder: Secondary | ICD-10-CM | POA: Insufficient documentation

## 2022-10-22 NOTE — Therapy (Signed)
OUTPATIENT PHYSICAL THERAPY EVALUATION   Patient Name: Brooke Floyd MRN: [id number] DOB:Oct 02, 1965, 57 y.o., female Today's Date: 10/22/2022  END OF SESSION:  PT End of Session - 10/22/22 1002     Visit Number 1    Number of Visits 12    Date for PT Re-Evaluation 12/03/22    Authorization Type BC[employer]    Progress Note Due on Visit 10    PT Start Time [phone]    PT Stop Time 1005    PT Time Calculation (min) 42 min    Activity Tolerance Patient tolerated treatment well;Patient limited by pain    Behavior During Therapy [facility] for tasks assessed/performed             Past Medical History:  Diagnosis Date   Asthma    GERD (gastroesophageal reflux disease)    Hyperlipidemia    Hypertension    Thrombocytosis 02/08/2017   Type 2 diabetes mellitus (HCC)    insulin dependent   Past Surgical History:  Procedure Laterality Date   COMBINED HYSTERECTOMY ABDOMINAL W/ A&P REPAIR / OOPHORECTOMY     WRIST ARTHROSCOPY     Patient Active Problem List   Diagnosis Date Noted   Seasonal and perennial allergic rhinitis 03/06/2021   Calcific tendinitis of right shoulder 08/19/2020   Ingrown toenail 07/08/2019   Adhesive capsulitis of left shoulder 07/04/2018   Mixed stress and urge urinary incontinence 05/16/2018   Thrombocytosis 02/08/2017   Abnormal vaginal bleeding 01/11/2017   Hot flashes 02/19/2016   Depression 09/29/2015   Restless leg syndrome 06/29/2015   Murmur 08/29/2013   Preventative health care 01/02/2013   HTN (hypertension) 06/14/2012   Hyperlipidemia 06/14/2012   Asthma, moderate persistent 02/17/2009   GERD 02/17/2009   Diabetes mellitus without complication (HCC) 05/10/1999    REFERRING PROVIDER: Huel Cote, MD  REFERRING DIAG: [phone] (ICD-10-CM) - Rotator cuff tendinitis, left; Rotator cuff strengthening program   Rationale for Evaluation and Treatment: Rehabilitation  THERAPY DIAG:  Chronic left shoulder pain  Muscle weakness (generalized)  ONSET  DATE: 10/18/21   SUBJECTIVE:                                                                                                                                                                                           SUBJECTIVE STATEMENT: My husband is a massage therapist and he works on me sometimes. My husband helps me with all ADLs.   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 She reports not having any treatment for this incident.  Pt has received  a Left Glenohumeral ultrasound guided injection from referring MD.  PAIN:  Are you having pain? Yes: NPRS scale: 6/10 Pain location: L shoulder Pain description: hurts, burning Aggravating factors: movement Relieving factors: rest, massage  PRECAUTIONS:  blind in L eye  WEIGHT BEARING RESTRICTIONS:  No  FALLS:  Has patient fallen in last 6 months? No  LIVING ENVIRONMENT: Lives with: lives with their family Lives in: House/apartment Stairs: Yes: Internal: 15 steps; can reach both; has a chair lift Has following equipment at home: Single point cane  OCCUPATION:  Not working  PLOF:  Independent  PATIENT GOALS:  "To just use my arm."  NEXT MD VISIT: 10/28/22   OBJECTIVE:   DIAGNOSTIC FINDINGS:  Xray (Right shoulder 3 views): Significant subacromial calcification  MRI shoulder left: There is significant inferior glenohumeral capsular thickening consistent with adhesive capsulitis 09/11/22: 1. Moderate progressive tendinopathy/tendinosis mainly involving the supraspinatus and infraspinatus tendons. There are interstitial tears and shallow bursal and articular surface tears but no full-thickness retracted tear. 2. Intact long head biceps tendon and glenoid labrum. 3. No significant findings for bony impingement. 4. There is thickening of the capsular structures in the axillary recess which can be seen with adhesive capsulitis or synovitis. 5. Minimal/mild  subacromial/subdeltoid bursitis.  PATIENT SURVEYS:  FOTO Not performed at eval due to pt being late  COGNITIVE STATUS: Within functional limits for tasks assessed   RED FLAGS: Bowel or bladder incontinence: Yes: bladder incontinence     SENSATION: WFL  POSTURE:  rounded shoulders  HAND DOMINANCE:  Right    Body Part #1 Shoulder  PALPATION: Hardness palpated around L GHJ. Scapula elevated L. L Upper Trap tight. L inferior shoulder glide restricted.  UE ROM UPPER EXTREMITY ROM:    Active ROM Seated Right eval Left eval  Shoulder flexion WNL 69 deg  Shoulder extension    Shoulder abduction WNL 54 deg  Shoulder adduction WNL unable  Shoulder extension WFL NT  Shoulder internal rotation WNL L4/5  Shoulder external rotation WNL unable  Elbow flexion WNL unable  Elbow extension WNL WNL  Wrist flexion    Wrist extension    Wrist ulnar deviation    Wrist radial deviation    Wrist pronation    Wrist supination     (Blank rows = not tested)  L shoulder PROM:  flexion-unable due to inability to relax    Abduction 47deg    IR 35 degrees    ER 15 degrees    Elbow flexion/ext WNL    Measured supine   UE MMT UPPER EXTREMITY MMT:  MMT Right eval Left eval  Shoulder flexion 4+/5 2-/5  Shoulder extension    Shoulder abduction 4+/5 2-/5  Shoulder adduction 5/5 3/5  Shoulder extension    Shoulder internal rotation 4+/5 unable  Shoulder external rotation 4+/5 unable  Middle trapezius    Lower trapezius    Elbow flexion 4+/5 unable  Elbow extension 4+/5 2-/5  Wrist flexion    Wrist extension    Wrist ulnar deviation    Wrist radial deviation    Wrist pronation    Wrist supination    Grip strength     (Blank rows = not tested)   Cervical AROM: WFL noted but with pt reports of L neck tightness  SPECIAL TESTS:  Upper Extremity Rotator cuff assessment: Drop arm test: negative and Empty can test: negative; Liftoff + for pain; pt unable to actively flex L  bicep    TREATMENT:  [facility] Adult PT Treatment:                                                DATE: 10/22/22  Therapeutic Activity: Discussed with pt PT POC and adhesive capuslitis; discussed wearing cross body bag not across L shoulder and using fanny pack due to compression put on L GHJ or using cross body bag in reverse  PATIENT EDUCATION:  Education details: Discussed with pt PT POC and adhesive capuslitis; discussed wearing cross body not across L shoulder or using fanny pack due to compression strap put on L GHJ Person educated: Patient Education method: Explanation Education comprehension: verbalized understanding  HOME EXERCISE PROGRAM: Not issued at eval due to pt being late   ASSESSMENT:  CLINICAL IMPRESSION: Patient is a 57 y.o. female who was seen today for physical therapy evaluation and treatment for L shoulder pain. Per referral, pt presents with L adhesive capsulitis and significant subacromial calcification along with moderate progressive tendinopathy/tendinosis mainly involving the supraspinatus and infraspinatus tendons. There are interstitial tears and shallow bursal and articular surface tears but no full-thickness retracted tear. Pt is very pain dominant and has various substitutions for all movements. Also, pt unable to actively flex L bicep at time of eval. Pt would benefit from PT for L shoulder AROM/PROM/scapular mobility for ROM and strengthening. Referral notes state rotator cuff strengthening program. Pt late for evaluation.   OBJECTIVE IMPAIRMENTS: decreased activity tolerance, decreased endurance, decreased mobility, decreased ROM, decreased strength, hypomobility, impaired flexibility, impaired UE functional use, postural dysfunction, and pain.   ACTIVITY LIMITATIONS: carrying, sleeping, bathing, dressing, reach over head,  hygiene/grooming, and caring for others  PARTICIPATION LIMITATIONS: meal prep, cleaning, shopping, and community activity  PERSONAL FACTORS: 1 comorbidity: Diabetes  are also affecting patient's functional outcome.   REHAB POTENTIAL: Good  CLINICAL DECISION MAKING: Stable/uncomplicated  EVALUATION COMPLEXITY: Low   GOALS: Goals reviewed with patient? Yes  SHORT TERM GOALS: Target date: 11/05/22  Pt will be independent with HEP to assist with improved L shoulder function Baseline: not issued at eval due to time Goal status: INITIAL  2.  Pt will demo improved L shoulder AROM flex to 90 degrees to assist with dressing Baseline: 69 degrees Goal status: INITIAL  3.  Pt will demo improved L shoulder IR to assist with dressing to L1. Baseline: L4/5 Goal status: INITIAL    LONG TERM GOALS: Target date: 12/03/22  Pt will be able to lift 1 grocery bag with 75% less difficulty. Baseline:  Goal status: INITIAL  2.  Pt will be able to pull up underwear and pants with </= 3/10 L arm pain Baseline: 10/10 Goal status: INITIAL  3.  Pt will demo >/=3+/5 L shoulder MMT throughout to assist with functional mobility at home.  Baseline:  MMT Right eval Left eval  Shoulder flexion 4+/5 2-/5  Shoulder extension    Shoulder abduction 4+/5 2-/5  Shoulder adduction 5/5 3/5  Shoulder extension    Shoulder internal rotation 4+/5 unable  Shoulder external rotation 4+/5 unable  Middle trapezius    Lower trapezius    Elbow flexion 4+/5 unable  Elbow extension 4+/5 2-/5   Goal status: INITIAL  4.  Pt will demo improved L shoulder flexion >/=110 degrees to assist with reaching into cabinets Baseline: 69 degrees Goal status: INITIAL  5.  Pt will report overall L  shoulder pain with functional mobility </= 3/10 Baseline: up to 10/10 Goal status: INITIAL    PLAN:  PT FREQUENCY: 2x/week  PT DURATION: 6 weeks  PLANNED INTERVENTIONS: Therapeutic exercises, Therapeutic activity,  Neuromuscular re-education, Patient/Family education, Self Care, Joint mobilization, Dry Needling, Electrical stimulation, Cryotherapy, Moist heat, Taping, Vasopneumatic device, Ultrasound, Manual therapy, and Re-evaluation.  PLAN FOR NEXT SESSION: Issue HEP for L shoulder AROM and scapular mobility, PROM L shoulder all planes if tolerated by patient; if not, AROM. Test for Biceps tear. Do Marylin Crosby, PT 10/22/2022, 11:18 AM

## 2022-10-27 ENCOUNTER — Encounter (HOSPITAL_BASED_OUTPATIENT_CLINIC_OR_DEPARTMENT_OTHER): Payer: Self-pay | Admitting: Physical Therapy

## 2022-10-27 ENCOUNTER — Ambulatory Visit (HOSPITAL_BASED_OUTPATIENT_CLINIC_OR_DEPARTMENT_OTHER): Payer: BC Managed Care – PPO | Admitting: Physical Therapy

## 2022-10-27 DIAGNOSIS — G8929 Other chronic pain: Secondary | ICD-10-CM

## 2022-10-27 DIAGNOSIS — M6281 Muscle weakness (generalized): Secondary | ICD-10-CM

## 2022-10-27 DIAGNOSIS — M25512 Pain in left shoulder: Secondary | ICD-10-CM | POA: Diagnosis not present

## 2022-10-27 NOTE — Therapy (Signed)
OUTPATIENT PHYSICAL THERAPY TREATMENT   Patient Name: Brooke Floyd MRN: [id number] DOB:[birth year], 57 y.o., female Today's Date: 10/27/2022  END OF SESSION:  PT End of Session - 10/27/22 0914     Visit Number 2    Number of Visits 12    Date for PT Re-Evaluation 12/03/22    Authorization Type [employer]    Progress Note Due on Visit 10    PT Start Time [phone]   pt late   PT Stop Time 0844    PT Time Calculation (min) 33 min    Activity Tolerance Patient limited by pain    Behavior During Therapy [facility] for tasks assessed/performed              Past Medical History:  Diagnosis Date   Asthma    GERD (gastroesophageal reflux disease)    Hyperlipidemia    Hypertension    Thrombocytosis 02/08/2017   Type 2 diabetes mellitus (HCC)    insulin dependent   Past Surgical History:  Procedure Laterality Date   COMBINED HYSTERECTOMY ABDOMINAL W/ A&P REPAIR / OOPHORECTOMY     WRIST ARTHROSCOPY     Patient Active Problem List   Diagnosis Date Noted   Seasonal and perennial allergic rhinitis 03/06/2021   Calcific tendinitis of right shoulder 08/19/2020   Ingrown toenail 07/08/2019   Adhesive capsulitis of left shoulder 07/04/2018   Mixed stress and urge urinary incontinence 05/16/2018   Thrombocytosis 02/08/2017   Abnormal vaginal bleeding 01/11/2017   Hot flashes 02/19/2016   Depression 09/29/2015   Restless leg syndrome 06/29/2015   Murmur 08/29/2013   Preventative health care 01/02/2013   HTN (hypertension) 06/14/2012   Hyperlipidemia 06/14/2012   Asthma, moderate persistent 02/17/2009   GERD 02/17/2009   Diabetes mellitus without complication (HCC) 05/10/1999    REFERRING PROVIDER: Huel Cote, MD  REFERRING DIAG: [phone] (ICD-10-CM) - Rotator cuff tendinitis, left; Rotator cuff strengthening program   Rationale for Evaluation and Treatment: Rehabilitation  THERAPY DIAG:  Chronic left shoulder pain  Muscle weakness (generalized)  ONSET DATE:  10/18/21   SUBJECTIVE:                                                                                                                                                                                           SUBJECTIVE STATEMENT:  Who are you? I thought I was working with "[name]" this morning, I was looking at you funny because I thought I was was working with her. Blood sugar was 64 this morning, 64 and I'm late because I was laying down and eating something.   PERTINENT HISTORY:   10/18/21: Pt was working  with a patient who fell onto her L arm going to sit in a chair. She reports not having any treatment for this incident.  Pt has received a Left Glenohumeral ultrasound guided injection from referring MD.  PAIN:  Are you having pain? Yes: NPRS scale: 3/10 Pain location: L shoulder Pain description: "not bothering me now but it builds up burning through the day"  Aggravating factors: movement Relieving factors: rest, massage, ice, heat   PRECAUTIONS:  blind in L eye  WEIGHT BEARING RESTRICTIONS:  No  FALLS:  Has patient fallen in last 6 months? No  LIVING ENVIRONMENT: Lives with: lives with their family Lives in: House/apartment Stairs: Yes: Internal: 15 steps; can reach both; has a chair lift Has following equipment at home: Single point cane  OCCUPATION:  Not working  PLOF:  Independent  PATIENT GOALS:  "To just use my arm."  NEXT MD VISIT: 10/28/22   OBJECTIVE:   DIAGNOSTIC FINDINGS:  Xray (Right shoulder 3 views): Significant subacromial calcification  MRI shoulder left: There is significant inferior glenohumeral capsular thickening consistent with adhesive capsulitis 09/11/22: 1. Moderate progressive tendinopathy/tendinosis mainly involving the supraspinatus and infraspinatus tendons. There are interstitial tears and shallow bursal and articular surface tears but no full-thickness retracted tear. 2. Intact long head biceps tendon and glenoid labrum. 3. No  significant findings for bony impingement. 4. There is thickening of the capsular structures in the axillary recess which can be seen with adhesive capsulitis or synovitis. 5. Minimal/mild subacromial/subdeltoid bursitis.  PATIENT SURVEYS:  FOTO Not performed at eval due to pt being late  COGNITIVE STATUS: Within functional limits for tasks assessed   RED FLAGS: Bowel or bladder incontinence: Yes: bladder incontinence     SENSATION: WFL  POSTURE:  rounded shoulders  HAND DOMINANCE:  Right    Body Part #1 Shoulder  PALPATION: Hardness palpated around L GHJ. Scapula elevated L. L Upper Trap tight. L inferior shoulder glide restricted.  UE ROM UPPER EXTREMITY ROM:    Active ROM Seated Right eval Left eval  Shoulder flexion WNL 69 deg  Shoulder extension    Shoulder abduction WNL 54 deg  Shoulder adduction WNL unable  Shoulder extension WFL NT  Shoulder internal rotation WNL L4/5  Shoulder external rotation WNL unable  Elbow flexion WNL unable  Elbow extension WNL WNL  Wrist flexion    Wrist extension    Wrist ulnar deviation    Wrist radial deviation    Wrist pronation    Wrist supination     (Blank rows = not tested)  L shoulder PROM:  flexion-unable due to inability to relax    Abduction 47deg    IR 35 degrees    ER 15 degrees    Elbow flexion/ext WNL    Measured supine   UE MMT UPPER EXTREMITY MMT:  MMT Right eval Left eval  Shoulder flexion 4+/5 2-/5  Shoulder extension    Shoulder abduction 4+/5 2-/5  Shoulder adduction 5/5 3/5  Shoulder extension    Shoulder internal rotation 4+/5 unable  Shoulder external rotation 4+/5 unable  Middle trapezius    Lower trapezius    Elbow flexion 4+/5 unable  Elbow extension 4+/5 2-/5  Wrist flexion    Wrist extension    Wrist ulnar deviation    Wrist radial deviation    Wrist pronation    Wrist supination    Grip strength     (Blank rows = not tested)   Cervical AROM: WFL noted but with pt  reports of L neck tightness  SPECIAL TESTS:  Upper Extremity Rotator cuff assessment: Drop arm test: negative and Empty can test: negative; Liftoff + for pain; pt unable to actively flex L bicep    TREATMENT:             [facility] Adult PT Treatment:                                                      DATE 10/09/22   Took FOTO- 23, predicted 54 in 15 visits   Education provided regarding frozen shoulder/adhesive capsulitis in context of diabetes, general education provided on POC and PT role in managing shoulder pain, importance of shoulder ROM and mobility in addressing shoulder pain, encouraged ice before PT, role of blood sugars in pain modulation and intensity, education on not putting ice directly on bare skin due to danger of burning skin. Encouraged participation as tolerated in PT but session was very limited due to reports of pain and pt being very self limiting today. Lots of education given while ice was applied to L shoulder (per pt request)  TherEx  Passive shoulder flexion x6 Passive shoulder ABD x6                                                                                                                      [facility] Adult PT Treatment:                                                DATE: 10/22/22  Therapeutic Activity: Discussed with pt PT POC and adhesive capuslitis; discussed wearing cross body bag not across L shoulder and using fanny pack due to compression put on L GHJ or using cross body bag in reverse  PATIENT EDUCATION:  Education details: see above 10/27/22 Person educated: Patient Education method: Explanation Education comprehension: verbalized understanding  HOME EXERCISE PROGRAM: Not issued at eval due to pt being late;    ASSESSMENT:  CLINICAL IMPRESSION:  Mechelle arrived a bit late today- per her report, blood sugars were low at 64, already rested and ate a snack/drank something to help bring sugar up. Took some time to complete FOTO and get formal  score. Due to hypoglycemia earlier this morning with no updated value from pt report focused session on non-aggressive interventions such as PROM, however patient tolerance to all interventions was EXTREMELY limited this morning. Often grabbed her shoulder due to pain even when PT was not touching her and endorses elevated pain levels that seem a bit unusual for adhesive capsulitis/general findings on imaging. Only able to tolerate a few reps of flexion and ABD PROM and needed extended time on ice between rounds, also unusual presentation  for this diagnosis. Overall she was extremely self limiting. She sees the MD tomorrow- I will send Dr. Steward Drone an update via Epic secure message as I am concerned about her ability to tolerate PT and make meaningful gains. Majority of session time spent on education today (see above for details) as pt was unable to tolerate physical interventions this session.    OBJECTIVE IMPAIRMENTS: decreased activity tolerance, decreased endurance, decreased mobility, decreased ROM, decreased strength, hypomobility, impaired flexibility, impaired UE functional use, postural dysfunction, and pain.   ACTIVITY LIMITATIONS: carrying, sleeping, bathing, dressing, reach over head, hygiene/grooming, and caring for others  PARTICIPATION LIMITATIONS: meal prep, cleaning, shopping, and community activity  PERSONAL FACTORS: 1 comorbidity: Diabetes  are also affecting patient's functional outcome.   REHAB POTENTIAL: Good  CLINICAL DECISION MAKING: Stable/uncomplicated  EVALUATION COMPLEXITY: Low   GOALS: Goals reviewed with patient? Yes  SHORT TERM GOALS: Target date: 11/05/22  Pt will be independent with HEP to assist with improved L shoulder function Baseline: not issued at eval due to time Goal status: INITIAL  2.  Pt will demo improved L shoulder AROM flex to 90 degrees to assist with dressing Baseline: 69 degrees Goal status: INITIAL  3.  Pt will demo improved L shoulder  IR to assist with dressing to L1. Baseline: L4/5 Goal status: INITIAL    LONG TERM GOALS: Target date: 12/03/22  Pt will be able to lift 1 grocery bag with 75% less difficulty. Baseline:  Goal status: INITIAL  2.  Pt will be able to pull up underwear and pants with </= 3/10 L arm pain Baseline: 10/10 Goal status: INITIAL  3.  Pt will demo >/=3+/5 L shoulder MMT throughout to assist with functional mobility at home.  Baseline:  MMT Right eval Left eval  Shoulder flexion 4+/5 2-/5  Shoulder extension    Shoulder abduction 4+/5 2-/5  Shoulder adduction 5/5 3/5  Shoulder extension    Shoulder internal rotation 4+/5 unable  Shoulder external rotation 4+/5 unable  Middle trapezius    Lower trapezius    Elbow flexion 4+/5 unable  Elbow extension 4+/5 2-/5   Goal status: INITIAL  4.  Pt will demo improved L shoulder flexion >/=110 degrees to assist with reaching into cabinets Baseline: 69 degrees Goal status: INITIAL  5.  Pt will report overall L shoulder pain with functional mobility </= 3/10 Baseline: up to 10/10 Goal status: INITIAL    PLAN:  PT FREQUENCY: 2x/week  PT DURATION: 6 weeks  PLANNED INTERVENTIONS: Therapeutic exercises, Therapeutic activity, Neuromuscular re-education, Patient/Family education, Self Care, Joint mobilization, Dry Needling, Electrical stimulation, Cryotherapy, Moist heat, Taping, Vasopneumatic device, Ultrasound, Manual therapy, and Re-evaluation.  PLAN FOR NEXT SESSION: Issue HEP as appropriate/tolerated; PROM L shoulder all planes if tolerated by patient; if not, AROM. Test for Biceps tear.   Nedra Hai, PT, DPT 10/27/22 9:14 AM

## 2022-10-28 ENCOUNTER — Ambulatory Visit (INDEPENDENT_AMBULATORY_CARE_PROVIDER_SITE_OTHER): Payer: BC Managed Care – PPO | Admitting: Orthopaedic Surgery

## 2022-10-28 DIAGNOSIS — M7502 Adhesive capsulitis of left shoulder: Secondary | ICD-10-CM

## 2022-10-28 MED ORDER — TRIAMCINOLONE ACETONIDE 40 MG/ML IJ SUSP
80.0000 mg | INTRAMUSCULAR | Status: AC | PRN
Start: 2022-10-28 — End: 2022-10-28
  Administered 2022-10-28: 80 mg via INTRA_ARTICULAR

## 2022-10-28 MED ORDER — LIDOCAINE HCL 1 % IJ SOLN
4.0000 mL | INTRAMUSCULAR | Status: AC | PRN
Start: 2022-10-28 — End: 2022-10-28
  Administered 2022-10-28: 4 mL

## 2022-10-28 NOTE — Progress Notes (Signed)
Chief Complaint: Left shoulder pain        History of Present Illness:    10/28/2022: Presents today for follow-up of her left shoulder.  She has been very painful since her last visit.  She is having a very difficult time even doing gentle arm raises.   Brooke Floyd is a 57 y.o. female presenting to clinic for evaluation of left shoulder pain.  She states that a lot of these issues began last July when someone's body weight fell directly onto her left shoulder.  This has been slowly worsening over time and states that the pain was very severe this morning prompting her to be seen.  She has had both a glenohumeral and subacromial injection which did give her some relief but only for 1 to 2 weeks.  She has also tried working with physical therapy however was only able to go for 2 visits due to her significant pain.  She has tried taking Tylenol arthritis as well as using a topical liniment cream which has been of little benefit.  Denies any other pain medications.  Denies any radiating numbness or tingling.     Surgical History:   None   PMH/PSH/Family History/Social History/Meds/Allergies:         Past Medical History:  Diagnosis Date   Asthma     GERD (gastroesophageal reflux disease)     Hyperlipidemia     Hypertension     Thrombocytosis 02/08/2017   Type 2 diabetes mellitus      insulin dependent         Past Surgical History:  Procedure Laterality Date   COMBINED HYSTERECTOMY ABDOMINAL W/ A&P REPAIR / OOPHORECTOMY       WRIST ARTHROSCOPY        Social History         Socioeconomic History   Marital status: Married      Spouse name: Not on file   Number of children: 2   Years of education: 14   Highest education level: Not on file  Occupational History   Occupation: CNA  Tobacco Use   Smoking status: Never   Smokeless tobacco: Never  Vaping Use   Vaping Use: Never used  Substance and Sexual Activity   Alcohol use: No      Alcohol/week: 0.0 standard  drinks of alcohol   Drug use: No   Sexual activity: Not Currently      Birth control/protection: Surgical  Other Topics Concern   Not on file  Social History Narrative   Not on file    Social Determinants of Health    Financial Resource Strain: Not on file  Food Insecurity: Not on file  Transportation Needs: Not on file  Physical Activity: Not on file  Stress: Not on file  Social Connections: Not on file         Family History  Problem Relation Age of Onset   Coronary artery disease Father     Diabetes Father     Diabetes Mother     Breast cancer Neg Hx     Colon cancer Neg Hx           Allergies  Allergen Reactions   Diclofenac Sodium Anaphylaxis   Voltaren [Diclofenac] Anaphylaxis   Hydrocodone Itching   Penicillins Other (See Comments)      convulsions   Codeine Itching          Current Outpatient Medications  Medication Sig Dispense Refill  albuterol (PROVENTIL) (2.5 MG/3ML) 0.083% nebulizer solution Take 3 mLs (2.5 mg total) by nebulization every 6 (six) hours as needed for wheezing or shortness of breath. 75 mL 12   albuterol (VENTOLIN HFA) 108 (90 Base) MCG/ACT inhaler Inhale 2 puffs into the lungs every 6 (six) hours as needed for wheezing or shortness of breath. 18 g 12   Blood Glucose Monitoring Suppl (FREESTYLE FREEDOM LITE) w/Device KIT check blood sugar up to 3 times a day 1 each 1   empagliflozin (JARDIANCE) 25 MG TABS tablet Take 1 tablet (25 mg total) by mouth daily before breakfast. 30 tablet 2   famotidine (PEPCID) 20 MG tablet 1 tab daily before supper 30 tablet 12   gabapentin (NEURONTIN) 300 MG capsule Take 3 capsules 2 hours before bedtime; may take 1 capsule as needed during the day. 120 capsule 5   glipiZIDE (GLUCOTROL XL) 10 MG 24 hr tablet Take 1 tablet (10 mg total) by mouth daily with breakfast. 30 tablet 2   glucose blood (FREESTYLE LITE) test strip Test blood sugar 3 times a day 100 each 12   hydrochlorothiazide (MICROZIDE) 12.5 MG  capsule Take 1 capsule (12.5 mg total) by mouth daily. 90 capsule 3   Insulin Pen Needle (PEN NEEDLES 3/16") 31G X 5 MM MISC Use to inject Lantus at bedtime. The patient is insulin requiring, ICD 10 code E11.65. The patient injects 1 times per day. 100 each 6   lisinopril (ZESTRIL) 40 MG tablet TAKE 1 TABLET (40 MG TOTAL) BY MOUTH DAILY (AM) 30 tablet 2   montelukast (SINGULAIR) 10 MG tablet Take 1 tablet (10 mg total) by mouth at bedtime. 90 tablet 3   nortriptyline (PAMELOR) 25 MG capsule Take 1 capsule (25 mg total) by mouth at bedtime. 30 capsule 2   prednisoLONE acetate (PRED FORTE) 1 % ophthalmic suspension Place 1 drop into the left eye 4 (four) times daily.       rOPINIRole (REQUIP) 1 MG tablet Take 1 mg by mouth at bedtime.       rosuvastatin (CRESTOR) 20 MG tablet TAKE 1 TABLET (20 MG TOTAL) BY MOUTH DAILY. 30 tablet 2    No current facility-administered medications for this visit.    Imaging Results (Last 48 hours)  No results found.     Review of Systems:   A ROS was performed including pertinent positives and negatives as documented in the HPI.   Physical Exam :   Constitutional: NAD and appears stated age Neurological: Alert and oriented Psych: Appropriate affect and cooperative There were no vitals taken for this visit.    Comprehensive Musculoskeletal Exam:     Left shoulder is without any obvious deformity or erythema.  Mild tenderness to palpation noted over the lateral shoulder as well as tightness throughout the deltoid.  No obvious crepitus with motion.  Patient is only able to actively forward elevate to about 60 degrees which elicits significant pain.  Passive external rotation to 30 degrees with minimal discomfort.  Unable to perform other special testing due to pain. Distal neurosensory exam intact.   Imaging:   Xray (Right shoulder 3 views): Significant subacromial calcification   MRI shoulder left: There is significant inferior glenohumeral capsular  thickening consistent with adhesive capsulitis   I personally reviewed and interpreted the radiographs.     Assessment:   57 y.o. female presenting for evaluation of ongoing left shoulder pain that has been worse since this morning.  We did discuss that this is consistent with  ongoing frozen shoulder.  At today's visit I did recommend an initial ultrasound-guided injection of the left shoulder.  Unfortunately she is having limited relief with this.  I will plan to see her back in 2 weeks for reassessment.  We did discuss the possibility of needing to ultimately perform arthroscopic debridement   Plan :     -Return to clinic in 2 weeks for follow-up -Left glenohumeral ultrasound-guided injection performed after verbal consent obtained    Procedure Note  Patient: Brooke Floyd             Date of Birth: 04-07-1966           MRN: [id number]             Visit Date: 10/28/2022  Procedures: Visit Diagnoses:  No diagnosis found.   Large Joint Inj: R glenohumeral on 10/28/2022 11:00 AM Indications: pain Details: 22 G 1.5 in needle, ultrasound-guided anterior approach  Arthrogram: No  Medications: 4 mL lidocaine 1 %; 80 mg triamcinolone acetonide 40 MG/ML Outcome: tolerated well, no immediate complications Procedure, treatment alternatives, risks and benefits explained, specific risks discussed. Consent was given by the patient. Immediately prior to procedure a time out was called to verify the correct patient, procedure, equipment, support staff and site/side marked as required. Patient was prepped and draped in the usual sterile fashion.

## 2022-10-29 ENCOUNTER — Encounter (HOSPITAL_BASED_OUTPATIENT_CLINIC_OR_DEPARTMENT_OTHER): Payer: Self-pay | Admitting: Physical Therapy

## 2022-10-29 ENCOUNTER — Ambulatory Visit (HOSPITAL_BASED_OUTPATIENT_CLINIC_OR_DEPARTMENT_OTHER): Payer: BC Managed Care – PPO | Admitting: Physical Therapy

## 2022-10-29 DIAGNOSIS — M25512 Pain in left shoulder: Secondary | ICD-10-CM | POA: Diagnosis not present

## 2022-10-29 DIAGNOSIS — G8929 Other chronic pain: Secondary | ICD-10-CM

## 2022-10-29 DIAGNOSIS — M6281 Muscle weakness (generalized): Secondary | ICD-10-CM

## 2022-10-29 NOTE — Therapy (Signed)
OUTPATIENT PHYSICAL THERAPY TREATMENT   Patient Name: Brooke Floyd MRN: [id number] DOB:1966-04-01, 57 y.o., female Today's Date: 10/29/2022  END OF SESSION:  PT End of Session - 10/29/22 0837     Visit Number 3    Number of Visits 12    Date for PT Re-Evaluation 12/03/22    Authorization Type [employer]    Progress Note Due on Visit 10    PT Start Time [phone]   pt arrived late   PT Stop Time 0919    PT Time Calculation (min) 41 min    Activity Tolerance Patient limited by pain    Behavior During Therapy [facility] for tasks assessed/performed               Past Medical History:  Diagnosis Date   Asthma    GERD (gastroesophageal reflux disease)    Hyperlipidemia    Hypertension    Thrombocytosis 02/08/2017   Type 2 diabetes mellitus (HCC)    insulin dependent   Past Surgical History:  Procedure Laterality Date   COMBINED HYSTERECTOMY ABDOMINAL W/ A&P REPAIR / OOPHORECTOMY     WRIST ARTHROSCOPY     Patient Active Problem List   Diagnosis Date Noted   Seasonal and perennial allergic rhinitis 03/06/2021   Calcific tendinitis of right shoulder 08/19/2020   Ingrown toenail 07/08/2019   Adhesive capsulitis of left shoulder 07/04/2018   Mixed stress and urge urinary incontinence 05/16/2018   Thrombocytosis 02/08/2017   Abnormal vaginal bleeding 01/11/2017   Hot flashes 02/19/2016   Depression 09/29/2015   Restless leg syndrome 06/29/2015   Murmur 08/29/2013   Preventative health care 01/02/2013   HTN (hypertension) 06/14/2012   Hyperlipidemia 06/14/2012   Asthma, moderate persistent 02/17/2009   GERD 02/17/2009   Diabetes mellitus without complication (HCC) 05/10/1999    REFERRING PROVIDER: Huel Cote, MD  REFERRING DIAG: [phone] (ICD-10-CM) - Rotator cuff tendinitis, left; Rotator cuff strengthening program   Rationale for Evaluation and Treatment: Rehabilitation  THERAPY DIAG:  Chronic left shoulder pain  Muscle weakness (generalized)  ONSET DATE:  10/18/21   SUBJECTIVE:                                                                                                                                                                                           SUBJECTIVE STATEMENT:  I had my 4th shot. Tingling in first 4 digits.   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 She reports not having any treatment for this incident.  Pt has received a Left Glenohumeral ultrasound guided  injection from referring MD.  PAIN:  Are you having pain? Yes: NPRS scale: 3/10 Pain location: L shoulder Pain description: "not bothering me now but it builds up burning through the day"  Aggravating factors: movement Relieving factors: rest, massage, ice, heat   PRECAUTIONS:  blind in L eye  WEIGHT BEARING RESTRICTIONS:  No  FALLS:  Has patient fallen in last 6 months? No  LIVING ENVIRONMENT: Lives with: lives with their family Lives in: House/apartment Stairs: Yes: Internal: 15 steps; can reach both; has a chair lift Has following equipment at home: Single point cane  OCCUPATION:  Not working  PLOF:  Independent  PATIENT GOALS:  "To just use my arm."  NEXT MD VISIT: 10/28/22   OBJECTIVE:   DIAGNOSTIC FINDINGS:  Xray (Right shoulder 3 views): Significant subacromial calcification  MRI shoulder left: There is significant inferior glenohumeral capsular thickening consistent with adhesive capsulitis 09/11/22: 1. Moderate progressive tendinopathy/tendinosis mainly involving the supraspinatus and infraspinatus tendons. There are interstitial tears and shallow bursal and articular surface tears but no full-thickness retracted tear. 2. Intact long head biceps tendon and glenoid labrum. 3. No significant findings for bony impingement. 4. There is thickening of the capsular structures in the axillary recess which can be seen with adhesive capsulitis or synovitis. 5.  Minimal/mild subacromial/subdeltoid bursitis.  PATIENT SURVEYS:  FOTO Not performed at eval due to pt being late  COGNITIVE STATUS: Within functional limits for tasks assessed   RED FLAGS: Bowel or bladder incontinence: Yes: bladder incontinence     SENSATION: WFL  POSTURE:  rounded shoulders  HAND DOMINANCE:  Right    Body Part #1 Shoulder  PALPATION: Hardness palpated around L GHJ. Scapula elevated L. L Upper Trap tight. L inferior shoulder glide restricted.  UE ROM UPPER EXTREMITY ROM:    Active ROM Seated Right eval Left eval  Shoulder flexion WNL 69 deg  Shoulder extension    Shoulder abduction WNL 54 deg  Shoulder adduction WNL unable  Shoulder extension WFL NT  Shoulder internal rotation WNL L4/5  Shoulder external rotation WNL unable  Elbow flexion WNL unable  Elbow extension WNL WNL  Wrist flexion    Wrist extension    Wrist ulnar deviation    Wrist radial deviation    Wrist pronation    Wrist supination     (Blank rows = not tested)  L shoulder PROM:  flexion-unable due to inability to relax    Abduction 47deg    IR 35 degrees    ER 15 degrees    Elbow flexion/ext WNL    Measured supine   UE MMT UPPER EXTREMITY MMT:  MMT Right eval Left eval  Shoulder flexion 4+/5 2-/5  Shoulder extension    Shoulder abduction 4+/5 2-/5  Shoulder adduction 5/5 3/5  Shoulder extension    Shoulder internal rotation 4+/5 unable  Shoulder external rotation 4+/5 unable  Middle trapezius    Lower trapezius    Elbow flexion 4+/5 unable  Elbow extension 4+/5 2-/5  Wrist flexion    Wrist extension    Wrist ulnar deviation    Wrist radial deviation    Wrist pronation    Wrist supination    Grip strength     (Blank rows = not tested)   Cervical AROM: WFL noted but with pt reports of L neck tightness  SPECIAL TESTS:  Upper Extremity Rotator cuff assessment: Drop arm test: negative and Empty can test: negative; Liftoff + for pain; pt unable to actively  flex L  bicep    TREATMENT:            Treatment                            6/22:  Trigger Point Dry Needling, Manual Therapy Treatment:  Initial or subsequent education regarding Trigger Point Dry Needling: Initial - education time included in Manual Did patient give consent to treatment with Trigger Point Dry Needling: Yes TPDN with skilled palpation and monitoring followed by STM to the following muscles: Lt upper trap, infraspinatus Lt first rib and thoracic rib mobs  STM with biofreeze to Lt periscap region Scapular retraction Upper trap & levator stretches  [facility] Adult PT Treatment:                                                      DATE 10/09/22   Took FOTO- 23, predicted 54 in 15 visits   Education provided regarding frozen shoulder/adhesive capsulitis in context of diabetes, general education provided on POC and PT role in managing shoulder pain, importance of shoulder ROM and mobility in addressing shoulder pain, encouraged ice before PT, role of blood sugars in pain modulation and intensity, education on not putting ice directly on bare skin due to danger of burning skin. Encouraged participation as tolerated in PT but session was very limited due to reports of pain and pt being very self limiting today. Lots of education given while ice was applied to L shoulder (per pt request)  TherEx  Passive shoulder flexion x6 Passive shoulder ABD x6                                                                                                                      [facility] Adult PT Treatment:                                                DATE: 10/22/22  Therapeutic Activity: Discussed with pt PT POC and adhesive capuslitis; discussed wearing cross body bag not across L shoulder and using fanny pack due to compression put on L GHJ or using cross body bag in reverse  PATIENT EDUCATION:  Education details: see above 10/27/22 Person educated: Patient Education method:  Explanation Education comprehension: verbalized understanding  HOME EXERCISE PROGRAM: [id number]    ASSESSMENT:  CLINICAL IMPRESSION:  Difficulty finding a comfortable position in prone- dropped arm support and placed pillow under shoulder. Limited rib mobility noted and concordant pain found upon palpation to trigger points in periscap region. Advised that she will be sore but she did note feeling a little better after DN. Asked that she wait an hour or 2 for ice to  the shoulder to allow biofreeze to sink in. Fear of pain/movement results in compensation via shoulder hike to lift arm rather than bending elbow- advised resting with a pillow under arm to decrease abd/IR posture and to bend elbow in order to decrease hike via upper trap.    OBJECTIVE IMPAIRMENTS: decreased activity tolerance, decreased endurance, decreased mobility, decreased ROM, decreased strength, hypomobility, impaired flexibility, impaired UE functional use, postural dysfunction, and pain.   ACTIVITY LIMITATIONS: carrying, sleeping, bathing, dressing, reach over head, hygiene/grooming, and caring for others  PARTICIPATION LIMITATIONS: meal prep, cleaning, shopping, and community activity  PERSONAL FACTORS: 1 comorbidity: Diabetes  are also affecting patient's functional outcome.   REHAB POTENTIAL: Good  CLINICAL DECISION MAKING: Stable/uncomplicated  EVALUATION COMPLEXITY: Low   GOALS: Goals reviewed with patient? Yes  SHORT TERM GOALS: Target date: 11/05/22  Pt will be independent with HEP to assist with improved L shoulder function Baseline: not issued at eval due to time Goal status: INITIAL  2.  Pt will demo improved L shoulder AROM flex to 90 degrees to assist with dressing Baseline: 69 degrees Goal status: INITIAL  3.  Pt will demo improved L shoulder IR to assist with dressing to L1. Baseline: L4/5 Goal status: INITIAL    LONG TERM GOALS: Target date: 12/03/22  Pt will be able to lift 1 grocery  bag with 75% less difficulty. Baseline:  Goal status: INITIAL  2.  Pt will be able to pull up underwear and pants with </= 3/10 L arm pain Baseline: 10/10 Goal status: INITIAL  3.  Pt will demo >/=3+/5 L shoulder MMT throughout to assist with functional mobility at home.  Baseline:  MMT Right eval Left eval  Shoulder flexion 4+/5 2-/5  Shoulder extension    Shoulder abduction 4+/5 2-/5  Shoulder adduction 5/5 3/5  Shoulder extension    Shoulder internal rotation 4+/5 unable  Shoulder external rotation 4+/5 unable  Middle trapezius    Lower trapezius    Elbow flexion 4+/5 unable  Elbow extension 4+/5 2-/5   Goal status: INITIAL  4.  Pt will demo improved L shoulder flexion >/=110 degrees to assist with reaching into cabinets Baseline: 69 degrees Goal status: INITIAL  5.  Pt will report overall L shoulder pain with functional mobility </= 3/10 Baseline: up to 10/10 Goal status: INITIAL    PLAN:  PT FREQUENCY: 2x/week  PT DURATION: 6 weeks  PLANNED INTERVENTIONS: Therapeutic exercises, Therapeutic activity, Neuromuscular re-education, Patient/Family education, Self Care, Joint mobilization, Dry Needling, Electrical stimulation, Cryotherapy, Moist heat, Taping, Vasopneumatic device, Ultrasound, Manual therapy, and Re-evaluation.  PLAN FOR NEXT SESSION: outcome of DN?  Annalei Friesz C. Nicky Kras PT, DPT 10/29/22 10:42 AM

## 2022-11-01 ENCOUNTER — Ambulatory Visit (HOSPITAL_BASED_OUTPATIENT_CLINIC_OR_DEPARTMENT_OTHER): Payer: BC Managed Care – PPO | Admitting: Physical Therapy

## 2022-11-03 ENCOUNTER — Encounter (HOSPITAL_BASED_OUTPATIENT_CLINIC_OR_DEPARTMENT_OTHER): Payer: Self-pay | Admitting: Physical Therapy

## 2022-11-03 ENCOUNTER — Ambulatory Visit (HOSPITAL_BASED_OUTPATIENT_CLINIC_OR_DEPARTMENT_OTHER): Payer: BC Managed Care – PPO | Admitting: Physical Therapy

## 2022-11-03 DIAGNOSIS — M6281 Muscle weakness (generalized): Secondary | ICD-10-CM

## 2022-11-03 DIAGNOSIS — M25512 Pain in left shoulder: Secondary | ICD-10-CM | POA: Diagnosis not present

## 2022-11-03 DIAGNOSIS — G8929 Other chronic pain: Secondary | ICD-10-CM

## 2022-11-03 NOTE — Therapy (Signed)
OUTPATIENT PHYSICAL THERAPY TREATMENT   Patient Name: Brooke Floyd MRN: [id number] DOB:Jul 04, 1965, 57 y.o., female Today's Date: 11/03/2022  END OF SESSION:  PT End of Session - 11/03/22 0954     Visit Number 4    Number of Visits 12    Date for PT Re-Evaluation 12/03/22    Authorization Type BCBS    Progress Note Due on Visit 10    PT Start Time 0824    PT Stop Time 0845    PT Time Calculation (min) 21 min    Activity Tolerance Patient limited by pain    Behavior During Therapy [facility] for tasks assessed/performed               Past Medical History:  Diagnosis Date   Asthma    GERD (gastroesophageal reflux disease)    Hyperlipidemia    Hypertension    Thrombocytosis 02/08/2017   Type 2 diabetes mellitus (HCC)    insulin dependent   Past Surgical History:  Procedure Laterality Date   COMBINED HYSTERECTOMY ABDOMINAL W/ A&P REPAIR / OOPHORECTOMY     WRIST ARTHROSCOPY     Patient Active Problem List   Diagnosis Date Noted   Seasonal and perennial allergic rhinitis 03/06/2021   Calcific tendinitis of right shoulder 08/19/2020   Ingrown toenail 07/08/2019   Adhesive capsulitis of left shoulder 07/04/2018   Mixed stress and urge urinary incontinence 05/16/2018   Thrombocytosis 02/08/2017   Abnormal vaginal bleeding 01/11/2017   Hot flashes 02/19/2016   Depression 09/29/2015   Restless leg syndrome 06/29/2015   Murmur 08/29/2013   Preventative health care 01/02/2013   HTN (hypertension) 06/14/2012   Hyperlipidemia 06/14/2012   Asthma, moderate persistent 02/17/2009   GERD 02/17/2009   Diabetes mellitus without complication (HCC) 05/10/1999    REFERRING PROVIDER: Huel Cote, MD  REFERRING DIAG: [phone] (ICD-10-CM) - Rotator cuff tendinitis, left; Rotator cuff strengthening program   Rationale for Evaluation and Treatment: Rehabilitation  THERAPY DIAG:  Chronic left shoulder pain  Muscle weakness (generalized)  ONSET DATE: 10/18/21   SUBJECTIVE:                                                                                                                                                                                            SUBJECTIVE STATEMENT:  Pt states she was very sore after the TPDN last session. Pt states the pain is bearable. She states that sleeping in the L side hurts. She also continues to have NT into the fingers of the L UE.  Of note, pt has had two deaths in the family recently that have greatly affected her sleep  and stress levels.    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 She reports not having any treatment for this incident.  Pt has received a Left Glenohumeral ultrasound guided injection from referring MD.  PAIN:  Are you having pain? Yes: NPRS scale: 3/10 Pain location: L shoulder Pain description: "not bothering me now but it builds up burning through the day"  Aggravating factors: movement Relieving factors: rest, massage, ice, heat   PRECAUTIONS:  blind in L eye  WEIGHT BEARING RESTRICTIONS:  No  FALLS:  Has patient fallen in last 6 months? No  LIVING ENVIRONMENT: Lives with: lives with their family Lives in: House/apartment Stairs: Yes: Internal: 15 steps; can reach both; has a chair lift Has following equipment at home: Single point cane  OCCUPATION:  Not working  PLOF:  Independent  PATIENT GOALS:  "To just use my arm."  NEXT MD VISIT: 10/28/22   OBJECTIVE:   DIAGNOSTIC FINDINGS:  Xray (Right shoulder 3 views): Significant subacromial calcification  MRI shoulder left: There is significant inferior glenohumeral capsular thickening consistent with adhesive capsulitis 09/11/22: 1. Moderate progressive tendinopathy/tendinosis mainly involving the supraspinatus and infraspinatus tendons. There are interstitial tears and shallow bursal and articular surface tears but no full-thickness retracted tear. 2. Intact  long head biceps tendon and glenoid labrum. 3. No significant findings for bony impingement. 4. There is thickening of the capsular structures in the axillary recess which can be seen with adhesive capsulitis or synovitis. 5. Minimal/mild subacromial/subdeltoid bursitis.  PATIENT SURVEYS:  FOTO Not performed at eval due to pt being late  COGNITIVE STATUS: Within functional limits for tasks assessed   RED FLAGS: Bowel or bladder incontinence: Yes: bladder incontinence     SENSATION: WFL  POSTURE:  rounded shoulders  HAND DOMINANCE:  Right    Body Part #1 Shoulder  PALPATION: Hardness palpated around L GHJ. Scapula elevated L. L Upper Trap tight. L inferior shoulder glide restricted.  UE ROM UPPER EXTREMITY ROM:    Active ROM Seated Right eval Left eval  Shoulder flexion WNL 69 deg  Shoulder extension    Shoulder abduction WNL 54 deg  Shoulder adduction WNL unable  Shoulder extension WFL NT  Shoulder internal rotation WNL L4/5  Shoulder external rotation WNL unable  Elbow flexion WNL unable  Elbow extension WNL WNL  Wrist flexion    Wrist extension    Wrist ulnar deviation    Wrist radial deviation    Wrist pronation    Wrist supination     (Blank rows = not tested)  L shoulder PROM:  flexion-unable due to inability to relax    Abduction 47deg    IR 35 degrees    ER 15 degrees    Elbow flexion/ext WNL    Measured supine   UE MMT UPPER EXTREMITY MMT:  MMT Right eval Left eval  Shoulder flexion 4+/5 2-/5  Shoulder extension    Shoulder abduction 4+/5 2-/5  Shoulder adduction 5/5 3/5  Shoulder extension    Shoulder internal rotation 4+/5 unable  Shoulder external rotation 4+/5 unable  Middle trapezius    Lower trapezius    Elbow flexion 4+/5 unable  Elbow extension 4+/5 2-/5  Wrist flexion    Wrist extension    Wrist ulnar deviation    Wrist radial deviation    Wrist pronation    Wrist supination    Grip strength     (Blank  rows = not  tested)   Cervical AROM: WFL noted but with pt reports of L neck tightness  SPECIAL TESTS:  Upper Extremity Rotator cuff assessment: Drop arm test: negative and Empty can test: negative; Liftoff + for pain; pt unable to actively flex L bicep    TREATMENT:            6/27  PROM of the L shoulder to tolerance; flexion to 90, ABD to 80 LAD with grade II oscillation STM L UT (not tolerated well)   SCM stretch 15s 4x Table slide 5x Scap squeeze 15x    Treatment                            6/22:  Trigger Point Dry Needling, Manual Therapy Treatment:  Initial or subsequent education regarding Trigger Point Dry Needling: Initial - education time included in Manual Did patient give consent to treatment with Trigger Point Dry Needling: Yes TPDN with skilled palpation and monitoring followed by STM to the following muscles: Lt upper trap, infraspinatus Lt first rib and thoracic rib mobs  STM with biofreeze to Lt periscap region Scapular retraction Upper trap & levator stretches  [facility] Adult PT Treatment:                                                      DATE 10/09/22   Took FOTO- 23, predicted 54 in 15 visits   Education provided regarding frozen shoulder/adhesive capsulitis in context of diabetes, general education provided on POC and PT role in managing shoulder pain, importance of shoulder ROM and mobility in addressing shoulder pain, encouraged ice before PT, role of blood sugars in pain modulation and intensity, education on not putting ice directly on bare skin due to danger of burning skin. Encouraged participation as tolerated in PT but session was very limited due to reports of pain and pt being very self limiting today. Lots of education given while ice was applied to L shoulder (per pt request)  TherEx  Passive shoulder flexion x6 Passive shoulder ABD x6                                                                                                                       [facility] Adult PT Treatment:                                                DATE: 10/22/22  Therapeutic Activity: Discussed with pt PT POC and adhesive capuslitis; discussed wearing cross body bag not across L shoulder and using fanny pack due to compression put on L GHJ or using cross body bag in reverse  PATIENT  EDUCATION:  Education details: see above 10/27/22 Person educated: Patient Education method: Explanation Education comprehension: verbalized understanding  HOME EXERCISE PROGRAM: [id number]    ASSESSMENT:  CLINICAL IMPRESSION:  Pt with very highly sensitive and irritable pain but was able to progress L shoulder PROM. Although pt does not tolerate manual/palpation well, pt is interested in considering TPDN again given she had mild benefit following session. Pt with signficant guarding of the L shoulder during all motions and pt tends to sit with scapular elevation at rest with very little excursion into any plane. Significant VC and TC required in order to get shoulder blade moving in all directions. Plan to consider use of TPDN at next and progression of shoulder AROM as tolerated. Pt would benefit from continued skilled therapy in order to reach goals and maximize functional L UE strength and ROM for return to PLOF.    OBJECTIVE IMPAIRMENTS: decreased activity tolerance, decreased endurance, decreased mobility, decreased ROM, decreased strength, hypomobility, impaired flexibility, impaired UE functional use, postural dysfunction, and pain.   ACTIVITY LIMITATIONS: carrying, sleeping, bathing, dressing, reach over head, hygiene/grooming, and caring for others  PARTICIPATION LIMITATIONS: meal prep, cleaning, shopping, and community activity  PERSONAL FACTORS: 1 comorbidity: Diabetes  are also affecting patient's functional outcome.   REHAB POTENTIAL: Good  CLINICAL DECISION MAKING: Stable/uncomplicated  EVALUATION COMPLEXITY: Low   GOALS: Goals reviewed with patient?  Yes  SHORT TERM GOALS: Target date: 11/05/22  Pt will be independent with HEP to assist with improved L shoulder function Baseline: not issued at eval due to time Goal status: INITIAL  2.  Pt will demo improved L shoulder AROM flex to 90 degrees to assist with dressing Baseline: 69 degrees Goal status: INITIAL  3.  Pt will demo improved L shoulder IR to assist with dressing to L1. Baseline: L4/5 Goal status: INITIAL    LONG TERM GOALS: Target date: 12/03/22  Pt will be able to lift 1 grocery bag with 75% less difficulty. Baseline:  Goal status: INITIAL  2.  Pt will be able to pull up underwear and pants with </= 3/10 L arm pain Baseline: 10/10 Goal status: INITIAL  3.  Pt will demo >/=3+/5 L shoulder MMT throughout to assist with functional mobility at home.  Baseline:  MMT Right eval Left eval  Shoulder flexion 4+/5 2-/5  Shoulder extension    Shoulder abduction 4+/5 2-/5  Shoulder adduction 5/5 3/5  Shoulder extension    Shoulder internal rotation 4+/5 unable  Shoulder external rotation 4+/5 unable  Middle trapezius    Lower trapezius    Elbow flexion 4+/5 unable  Elbow extension 4+/5 2-/5   Goal status: INITIAL  4.  Pt will demo improved L shoulder flexion >/=110 degrees to assist with reaching into cabinets Baseline: 69 degrees Goal status: INITIAL  5.  Pt will report overall L shoulder pain with functional mobility </= 3/10 Baseline: up to 10/10 Goal status: INITIAL    PLAN:  PT FREQUENCY: 2x/week  PT DURATION: 6 weeks  PLANNED INTERVENTIONS: Therapeutic exercises, Therapeutic activity, Neuromuscular re-education, Patient/Family education, Self Care, Joint mobilization, Dry Needling, Electrical stimulation, Cryotherapy, Moist heat, Taping, Vasopneumatic device, Ultrasound, Manual therapy, and Re-evaluation.  PLAN FOR NEXT SESSION: outcome of DN?  Jessica C. Hightower PT, DPT 11/03/22 10:04 AM

## 2022-11-08 ENCOUNTER — Ambulatory Visit (HOSPITAL_BASED_OUTPATIENT_CLINIC_OR_DEPARTMENT_OTHER): Payer: BC Managed Care – PPO | Admitting: Physical Therapy

## 2022-11-11 ENCOUNTER — Telehealth (HOSPITAL_BASED_OUTPATIENT_CLINIC_OR_DEPARTMENT_OTHER): Payer: Self-pay | Admitting: Orthopaedic Surgery

## 2022-11-11 ENCOUNTER — Ambulatory Visit (INDEPENDENT_AMBULATORY_CARE_PROVIDER_SITE_OTHER): Payer: BC Managed Care – PPO | Admitting: Orthopaedic Surgery

## 2022-11-11 ENCOUNTER — Telehealth: Payer: Self-pay

## 2022-11-11 ENCOUNTER — Other Ambulatory Visit (HOSPITAL_BASED_OUTPATIENT_CLINIC_OR_DEPARTMENT_OTHER): Payer: Self-pay

## 2022-11-11 ENCOUNTER — Ambulatory Visit (HOSPITAL_BASED_OUTPATIENT_CLINIC_OR_DEPARTMENT_OTHER): Payer: Self-pay | Admitting: Orthopaedic Surgery

## 2022-11-11 DIAGNOSIS — M7502 Adhesive capsulitis of left shoulder: Secondary | ICD-10-CM

## 2022-11-11 MED ORDER — IBUPROFEN 800 MG PO TABS
800.0000 mg | ORAL_TABLET | Freq: Three times a day (TID) | ORAL | 0 refills | Status: AC
  Filled 2022-11-11: qty 30, 10d supply, fill #0

## 2022-11-11 MED ORDER — ACETAMINOPHEN 500 MG PO TABS
500.0000 mg | ORAL_TABLET | Freq: Three times a day (TID) | ORAL | 0 refills | Status: AC
  Filled 2022-11-11: qty 30, 10d supply, fill #0

## 2022-11-11 MED ORDER — HYDROMORPHONE HCL 2 MG PO TABS
2.0000 mg | ORAL_TABLET | ORAL | 0 refills | Status: DC | PRN
  Filled 2022-11-11: qty 10, 2d supply, fill #0

## 2022-11-11 NOTE — Telephone Encounter (Signed)
Responded via MyCHart patient's restrictions

## 2022-11-11 NOTE — Telephone Encounter (Signed)
Patient would like to know if she has any restrictions and if so, would like for her restrictions to be sent to her through mychart.  Cb# [phone].  Please advise.  Thank you.

## 2022-11-11 NOTE — Progress Notes (Signed)
Chief Complaint: Left shoulder pain        History of Present Illness:    11/11/2022: Presents today for follow-up.  Overall she did get initial relief from her injection but this is worn off.  She has been using CBD Gummies as well which are helpful.   Brooke Floyd is a 57 y.o. female presenting to clinic for evaluation of left shoulder pain.  She states that a lot of these issues began last July when someone's body weight fell directly onto her left shoulder.  This has been slowly worsening over time and states that the pain was very severe this morning prompting her to be seen.  She has had both a glenohumeral and subacromial injection which did give her some relief but only for 1 to 2 weeks.  She has also tried working with physical therapy however was only able to go for 2 visits due to her significant pain.  She has tried taking Tylenol arthritis as well as using a topical liniment cream which has been of little benefit.  Denies any other pain medications.  Denies any radiating numbness or tingling.     Surgical History:   None   PMH/PSH/Family History/Social History/Meds/Allergies:         Past Medical History:  Diagnosis Date   Asthma     GERD (gastroesophageal reflux disease)     Hyperlipidemia     Hypertension     Thrombocytosis 02/08/2017   Type 2 diabetes mellitus      insulin dependent         Past Surgical History:  Procedure Laterality Date   COMBINED HYSTERECTOMY ABDOMINAL W/ A&P REPAIR / OOPHORECTOMY       WRIST ARTHROSCOPY        Social History         Socioeconomic History   Marital status: Married      Spouse name: Not on file   Number of children: 2   Years of education: 14   Highest education level: Not on file  Occupational History   Occupation: CNA  Tobacco Use   Smoking status: Never   Smokeless tobacco: Never  Vaping Use   Vaping Use: Never used  Substance and Sexual Activity   Alcohol use: No      Alcohol/week: 0.0 standard  drinks of alcohol   Drug use: No   Sexual activity: Not Currently      Birth control/protection: Surgical  Other Topics Concern   Not on file  Social History Narrative   Not on file    Social Determinants of Health    Financial Resource Strain: Not on file  Food Insecurity: Not on file  Transportation Needs: Not on file  Physical Activity: Not on file  Stress: Not on file  Social Connections: Not on file         Family History  Problem Relation Age of Onset   Coronary artery disease Father     Diabetes Father     Diabetes Mother     Breast cancer Neg Hx     Colon cancer Neg Hx           Allergies  Allergen Reactions   Diclofenac Sodium Anaphylaxis   Voltaren [Diclofenac] Anaphylaxis   Hydrocodone Itching   Penicillins Other (See Comments)      convulsions   Codeine Itching          Current Outpatient Medications  Medication Sig Dispense Refill  albuterol (PROVENTIL) (2.5 MG/3ML) 0.083% nebulizer solution Take 3 mLs (2.5 mg total) by nebulization every 6 (six) hours as needed for wheezing or shortness of breath. 75 mL 12   albuterol (VENTOLIN HFA) 108 (90 Base) MCG/ACT inhaler Inhale 2 puffs into the lungs every 6 (six) hours as needed for wheezing or shortness of breath. 18 g 12   Blood Glucose Monitoring Suppl (FREESTYLE FREEDOM LITE) w/Device KIT check blood sugar up to 3 times a day 1 each 1   empagliflozin (JARDIANCE) 25 MG TABS tablet Take 1 tablet (25 mg total) by mouth daily before breakfast. 30 tablet 2   famotidine (PEPCID) 20 MG tablet 1 tab daily before supper 30 tablet 12   gabapentin (NEURONTIN) 300 MG capsule Take 3 capsules 2 hours before bedtime; may take 1 capsule as needed during the day. 120 capsule 5   glipiZIDE (GLUCOTROL XL) 10 MG 24 hr tablet Take 1 tablet (10 mg total) by mouth daily with breakfast. 30 tablet 2   glucose blood (FREESTYLE LITE) test strip Test blood sugar 3 times a day 100 each 12   hydrochlorothiazide (MICROZIDE) 12.5 MG  capsule Take 1 capsule (12.5 mg total) by mouth daily. 90 capsule 3   Insulin Pen Needle (PEN NEEDLES 3/16") 31G X 5 MM MISC Use to inject Lantus at bedtime. The patient is insulin requiring, ICD 10 code E11.65. The patient injects 1 times per day. 100 each 6   lisinopril (ZESTRIL) 40 MG tablet TAKE 1 TABLET (40 MG TOTAL) BY MOUTH DAILY (AM) 30 tablet 2   montelukast (SINGULAIR) 10 MG tablet Take 1 tablet (10 mg total) by mouth at bedtime. 90 tablet 3   nortriptyline (PAMELOR) 25 MG capsule Take 1 capsule (25 mg total) by mouth at bedtime. 30 capsule 2   prednisoLONE acetate (PRED FORTE) 1 % ophthalmic suspension Place 1 drop into the left eye 4 (four) times daily.       rOPINIRole (REQUIP) 1 MG tablet Take 1 mg by mouth at bedtime.       rosuvastatin (CRESTOR) 20 MG tablet TAKE 1 TABLET (20 MG TOTAL) BY MOUTH DAILY. 30 tablet 2    No current facility-administered medications for this visit.    Imaging Results (Last 48 hours)  No results found.     Review of Systems:   A ROS was performed including pertinent positives and negatives as documented in the HPI.   Physical Exam :   Constitutional: NAD and appears stated age Neurological: Alert and oriented Psych: Appropriate affect and cooperative There were no vitals taken for this visit.    Comprehensive Musculoskeletal Exam:     Left shoulder is without any obvious deformity or erythema.  Mild tenderness to palpation noted over the lateral shoulder as well as tightness throughout the deltoid.  No obvious crepitus with motion.  Patient is only able to actively forward elevate to about 60 degrees which elicits significant pain.  Passive external rotation to 30 degrees with minimal discomfort.  Unable to perform other special testing due to pain. Distal neurosensory exam intact.   Imaging:   Xray (Right shoulder 3 views): Significant subacromial calcification   MRI shoulder left: There is significant inferior glenohumeral capsular  thickening consistent with adhesive capsulitis   I personally reviewed and interpreted the radiographs.     Assessment:   57 y.o. female presenting for evaluation of ongoing left shoulder pain that has been worse since this morning.  We did discuss that this is consistent with  ongoing frozen shoulder.  At this time she is having limited relief from her injections.  Given this we did discuss surgical options including arthroscopic lysis of adhesions and manipulation under anesthesia.  I did discuss limitations and restrictions associated with this.  After long discussion she has elected for this.   Plan :     -Plan for left shoulder arthroscopy with lysis of adhesions with manipulation under anesthesia   After a lengthy discussion of treatment options, including risks, benefits, alternatives, complications of surgical and nonsurgical conservative options, the patient elected surgical repair.   The patient  is aware of the material risks  and complications including, but not limited to injury to adjacent structures, neurovascular injury, infection, numbness, bleeding, implant failure, thermal burns, stiffness, persistent pain, failure to heal, disease transmission from allograft, need for further surgery, dislocation, anesthetic risks, blood clots, risks of death,and others. The probabilities of surgical success and failure discussed with patient given their particular co-morbidities.The time and nature of expected rehabilitation and recovery was discussed.The patient's questions were all answered preoperatively.  No barriers to understanding were noted. I explained the natural history of the disease process and Rx rationale.  I explained to the patient what I considered to be reasonable expectations given their personal situation.  The final treatment plan was arrived at through a shared patient decision making process model.

## 2022-11-12 ENCOUNTER — Ambulatory Visit (HOSPITAL_BASED_OUTPATIENT_CLINIC_OR_DEPARTMENT_OTHER)
Payer: BC Managed Care – PPO | Attending: Orthopaedic Surgery | Admitting: Rehabilitative and Restorative Service Providers"

## 2022-11-12 ENCOUNTER — Telehealth (HOSPITAL_BASED_OUTPATIENT_CLINIC_OR_DEPARTMENT_OTHER): Payer: Self-pay | Admitting: Rehabilitative and Restorative Service Providers"

## 2022-11-12 DIAGNOSIS — M6281 Muscle weakness (generalized): Secondary | ICD-10-CM | POA: Insufficient documentation

## 2022-11-12 DIAGNOSIS — G8929 Other chronic pain: Secondary | ICD-10-CM | POA: Insufficient documentation

## 2022-11-12 DIAGNOSIS — M7582 Other shoulder lesions, left shoulder: Secondary | ICD-10-CM | POA: Insufficient documentation

## 2022-11-12 DIAGNOSIS — M25512 Pain in left shoulder: Secondary | ICD-10-CM | POA: Insufficient documentation

## 2022-11-12 NOTE — Telephone Encounter (Signed)
Pt did not show for appt. PT phoned pt and number listed in EPIC. Call forwarded to voicemail. PT asked pt to give facility a call in generic voicemail. Upon chart review in epic, pt had appt with MD 11/11/22 and elected surgery.

## 2022-11-16 ENCOUNTER — Ambulatory Visit (HOSPITAL_BASED_OUTPATIENT_CLINIC_OR_DEPARTMENT_OTHER): Payer: BC Managed Care – PPO | Admitting: Physical Therapy

## 2022-11-16 ENCOUNTER — Telehealth (HOSPITAL_BASED_OUTPATIENT_CLINIC_OR_DEPARTMENT_OTHER): Payer: Self-pay | Admitting: Orthopaedic Surgery

## 2022-11-16 ENCOUNTER — Encounter (HOSPITAL_BASED_OUTPATIENT_CLINIC_OR_DEPARTMENT_OTHER): Payer: Self-pay | Admitting: Physical Therapy

## 2022-11-16 DIAGNOSIS — M6281 Muscle weakness (generalized): Secondary | ICD-10-CM

## 2022-11-16 DIAGNOSIS — G8929 Other chronic pain: Secondary | ICD-10-CM

## 2022-11-16 NOTE — Therapy (Signed)
OUTPATIENT PHYSICAL THERAPY TREATMENT   Patient Name: Brooke Floyd MRN: [id number] DOB:[birth year], 57 y.o., female Today's Date: 11/16/2022  END OF SESSION:  PT End of Session - 11/16/22 0818     Visit Number 5    Number of Visits 12    Date for PT Re-Evaluation 12/03/22    Authorization Type BCBS    Progress Note Due on Visit 10    PT Start Time 0806    PT Stop Time 0818    PT Time Calculation (min) 12 min    Activity Tolerance Patient limited by pain    Behavior During Therapy [facility] for tasks assessed/performed                Past Medical History:  Diagnosis Date   Asthma    GERD (gastroesophageal reflux disease)    Hyperlipidemia    Hypertension    Thrombocytosis 02/08/2017   Type 2 diabetes mellitus (HCC)    insulin dependent   Past Surgical History:  Procedure Laterality Date   COMBINED HYSTERECTOMY ABDOMINAL W/ A&P REPAIR / OOPHORECTOMY     WRIST ARTHROSCOPY     Patient Active Problem List   Diagnosis Date Noted   Seasonal and perennial allergic rhinitis 03/06/2021   Calcific tendinitis of right shoulder 08/19/2020   Ingrown toenail 07/08/2019   Adhesive capsulitis of left shoulder 07/04/2018   Mixed stress and urge urinary incontinence 05/16/2018   Thrombocytosis 02/08/2017   Abnormal vaginal bleeding 01/11/2017   Hot flashes 02/19/2016   Depression 09/29/2015   Restless leg syndrome 06/29/2015   Murmur 08/29/2013   Preventative health care 01/02/2013   HTN (hypertension) 06/14/2012   Hyperlipidemia 06/14/2012   Asthma, moderate persistent 02/17/2009   GERD 02/17/2009   Diabetes mellitus without complication (HCC) 05/10/1999    REFERRING PROVIDER: Huel Cote, MD  REFERRING DIAG: [phone] (ICD-10-CM) - Rotator cuff tendinitis, left; Rotator cuff strengthening program   Rationale for Evaluation and Treatment: Rehabilitation  THERAPY DIAG:  Chronic left shoulder pain  Muscle weakness (generalized)  ONSET DATE:  10/18/21   SUBJECTIVE:                                                                                                                                                                                           SUBJECTIVE STATEMENT:  Pt states that she has not been able to do HEP at all due to the pain. Pt does have planned surgery upcoming. Pt will be going upstairs to talk to surgical scheduler. Pain has a been up to a 10/10. The only relief pt gets with moving the arm is taking CBD.  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 She reports not having any treatment for this incident.  Pt has received a Left Glenohumeral ultrasound guided injection from referring MD.  PAIN:  Are you having pain? Yes: NPRS scale: 3/10 Pain location: L shoulder Pain description: "not bothering me now but it builds up burning through the day"  Aggravating factors: movement Relieving factors: rest, massage, ice, heat    No visit charge for this session. Pt has been having 10/10 pain and unable to perform HEP due to pain. Pt plans to have upcoming surgery for her L shoulder. Plan to have pt return to PT following surgery for post-operative therapy.   Zebedee Iba PT, DPT 11/16/22 8:23 AM

## 2022-11-16 NOTE — Telephone Encounter (Signed)
Shoulder injection

## 2022-11-18 ENCOUNTER — Ambulatory Visit (HOSPITAL_BASED_OUTPATIENT_CLINIC_OR_DEPARTMENT_OTHER): Payer: BC Managed Care – PPO | Admitting: Physical Therapy

## 2022-11-21 ENCOUNTER — Ambulatory Visit (HOSPITAL_BASED_OUTPATIENT_CLINIC_OR_DEPARTMENT_OTHER): Payer: BC Managed Care – PPO | Admitting: Orthopaedic Surgery

## 2022-11-28 ENCOUNTER — Ambulatory Visit (AMBULATORY_SURGERY_CENTER): Payer: BC Managed Care – PPO

## 2022-11-28 VITALS — Ht 62.0 in | Wt 129.0 lb

## 2022-11-28 DIAGNOSIS — Z86010 Personal history of colonic polyps: Secondary | ICD-10-CM

## 2022-11-28 MED ORDER — NA SULFATE-K SULFATE-MG SULF 17.5-3.13-1.6 GM/177ML PO SOLN
1.0000 | Freq: Once | ORAL | 0 refills | Status: AC
Start: 2022-11-28 — End: 2022-11-28

## 2022-11-28 NOTE — Progress Notes (Signed)

## 2022-12-02 ENCOUNTER — Other Ambulatory Visit: Payer: Self-pay | Admitting: Nurse Practitioner

## 2022-12-02 DIAGNOSIS — Z1231 Encounter for screening mammogram for malignant neoplasm of breast: Secondary | ICD-10-CM

## 2022-12-05 ENCOUNTER — Ambulatory Visit (INDEPENDENT_AMBULATORY_CARE_PROVIDER_SITE_OTHER): Payer: BC Managed Care – PPO | Admitting: Orthopaedic Surgery

## 2022-12-05 DIAGNOSIS — M25512 Pain in left shoulder: Secondary | ICD-10-CM | POA: Diagnosis not present

## 2022-12-05 DIAGNOSIS — M7502 Adhesive capsulitis of left shoulder: Secondary | ICD-10-CM | POA: Diagnosis not present

## 2022-12-05 MED ORDER — LIDOCAINE HCL 1 % IJ SOLN
4.0000 mL | INTRAMUSCULAR | Status: AC | PRN
Start: 2022-12-05 — End: 2022-12-05
  Administered 2022-12-05: 4 mL

## 2022-12-05 MED ORDER — TRIAMCINOLONE ACETONIDE 40 MG/ML IJ SUSP
80.0000 mg | INTRAMUSCULAR | Status: AC | PRN
Start: 2022-12-05 — End: 2022-12-05
  Administered 2022-12-05: 80 mg via INTRA_ARTICULAR

## 2022-12-05 NOTE — Progress Notes (Signed)
Chief Complaint: Left shoulder pain        History of Present Illness:    12/05/2022: Presents today she would like a left shoulder injection.   Brooke Floyd is a 57 y.o. female presenting to clinic for evaluation of left shoulder pain.  She states that a lot of these issues began last July when someone's body weight fell directly onto her left shoulder.  This has been slowly worsening over time and states that the pain was very severe this morning prompting her to be seen.  She has had both a glenohumeral and subacromial injection which did give her some relief but only for 1 to 2 weeks.  She has also tried working with physical therapy however was only able to go for 2 visits due to her significant pain.  She has tried taking Tylenol arthritis as well as using a topical liniment cream which has been of little benefit.  Denies any other pain medications.  Denies any radiating numbness or tingling.     Surgical History:   None   PMH/PSH/Family History/Social History/Meds/Allergies:         Past Medical History:  Diagnosis Date  . Asthma    . GERD (gastroesophageal reflux disease)    . Hyperlipidemia    . Hypertension    . Thrombocytosis 02/08/2017  . Type 2 diabetes mellitus      insulin dependent         Past Surgical History:  Procedure Laterality Date  . COMBINED HYSTERECTOMY ABDOMINAL W/ A&P REPAIR / OOPHORECTOMY      . WRIST ARTHROSCOPY        Social History         Socioeconomic History  . Marital status: Married      Spouse name: Not on file  . Number of children: 2  . Years of education: 27  . Highest education level: Not on file  Occupational History  . Occupation: CNA  Tobacco Use  . Smoking status: Never  . Smokeless tobacco: Never  Vaping Use  . Vaping Use: Never used  Substance and Sexual Activity  . Alcohol use: No      Alcohol/week: 0.0 standard drinks of alcohol  . Drug use: No  . Sexual activity: Not Currently      Birth  control/protection: Surgical  Other Topics Concern  . Not on file  Social History Narrative  . Not on file    Social Determinants of Health    Financial Resource Strain: Not on file  Food Insecurity: Not on file  Transportation Needs: Not on file  Physical Activity: Not on file  Stress: Not on file  Social Connections: Not on file         Family History  Problem Relation Age of Onset  . Coronary artery disease Father    . Diabetes Father    . Diabetes Mother    . Breast cancer Neg Hx    . Colon cancer Neg Hx           Allergies  Allergen Reactions  . Diclofenac Sodium Anaphylaxis  . Voltaren [Diclofenac] Anaphylaxis  . Hydrocodone Itching  . Penicillins Other (See Comments)      convulsions  . Codeine Itching          Current Outpatient Medications  Medication Sig Dispense Refill  . albuterol (PROVENTIL) (2.5 MG/3ML) 0.083% nebulizer solution Take 3 mLs (2.5 mg total) by nebulization every 6 (six) hours as needed for  wheezing or shortness of breath. 75 mL 12  . albuterol (VENTOLIN HFA) 108 (90 Base) MCG/ACT inhaler Inhale 2 puffs into the lungs every 6 (six) hours as needed for wheezing or shortness of breath. 18 g 12  . Blood Glucose Monitoring Suppl (FREESTYLE FREEDOM LITE) w/Device KIT check blood sugar up to 3 times a day 1 each 1  . empagliflozin (JARDIANCE) 25 MG TABS tablet Take 1 tablet (25 mg total) by mouth daily before breakfast. 30 tablet 2  . famotidine (PEPCID) 20 MG tablet 1 tab daily before supper 30 tablet 12  . gabapentin (NEURONTIN) 300 MG capsule Take 3 capsules 2 hours before bedtime; may take 1 capsule as needed during the day. 120 capsule 5  . glipiZIDE (GLUCOTROL XL) 10 MG 24 hr tablet Take 1 tablet (10 mg total) by mouth daily with breakfast. 30 tablet 2  . glucose blood (FREESTYLE LITE) test strip Test blood sugar 3 times a day 100 each 12  . hydrochlorothiazide (MICROZIDE) 12.5 MG capsule Take 1 capsule (12.5 mg total) by mouth daily. 90 capsule  3  . Insulin Pen Needle (PEN NEEDLES 3/16") 31G X 5 MM MISC Use to inject Lantus at bedtime. The patient is insulin requiring, ICD 10 code E11.65. The patient injects 1 times per day. 100 each 6  . lisinopril (ZESTRIL) 40 MG tablet TAKE 1 TABLET (40 MG TOTAL) BY MOUTH DAILY (AM) 30 tablet 2  . montelukast (SINGULAIR) 10 MG tablet Take 1 tablet (10 mg total) by mouth at bedtime. 90 tablet 3  . nortriptyline (PAMELOR) 25 MG capsule Take 1 capsule (25 mg total) by mouth at bedtime. 30 capsule 2  . prednisoLONE acetate (PRED FORTE) 1 % ophthalmic suspension Place 1 drop into the left eye 4 (four) times daily.      Marland Kitchen rOPINIRole (REQUIP) 1 MG tablet Take 1 mg by mouth at bedtime.      . rosuvastatin (CRESTOR) 20 MG tablet TAKE 1 TABLET (20 MG TOTAL) BY MOUTH DAILY. 30 tablet 2    No current facility-administered medications for this visit.    Imaging Results (Last 48 hours)  No results found.     Review of Systems:   A ROS was performed including pertinent positives and negatives as documented in the HPI.   Physical Exam :   Constitutional: NAD and appears stated age Neurological: Alert and oriented Psych: Appropriate affect and cooperative There were no vitals taken for this visit.    Comprehensive Musculoskeletal Exam:     Left shoulder is without any obvious deformity or erythema.  Mild tenderness to palpation noted over the lateral shoulder as well as tightness throughout the deltoid.  No obvious crepitus with motion.  Patient is only able to actively forward elevate to about 60 degrees which elicits significant pain.  Passive external rotation to 30 degrees with minimal discomfort.  Unable to perform other special testing due to pain. Distal neurosensory exam intact.   Imaging:   Xray (Right shoulder 3 views): Significant subacromial calcification   MRI shoulder left: There is significant inferior glenohumeral capsular thickening consistent with adhesive capsulitis   I personally  reviewed and interpreted the radiographs.     Assessment:   57 y.o. female presenting for evaluation of ongoing left shoulder pain that has been worse since this morning.  We did discuss that this is consistent with ongoing frozen shoulder.  At this time she is having limited relief from her injections.  Given this we did discuss surgical  options including arthroscopic lysis of adhesions and manipulation under anesthesia.  I did discuss limitations and restrictions associated with this.  After long discussion she has elected for this.   Plan :     She is hoping for an additional injection at today's visit which we will plan to provide prior to arthroscopic surgery.    Procedure Note  Patient: Brooke Floyd             Date of Birth: [birth year]           MRN: [id number]             Visit Date: 12/05/2022  Procedures: Visit Diagnoses: No diagnosis found.  Large Joint Inj: L glenohumeral on 12/05/2022 9:02 AM Indications: pain Details: 22 G 1.5 in needle, ultrasound-guided anterior approach  Arthrogram: No  Medications: 4 mL lidocaine 1 %; 80 mg triamcinolone acetonide 40 MG/ML Outcome: tolerated well, no immediate complications Procedure, treatment alternatives, risks and benefits explained, specific risks discussed. Consent was given by the patient. Immediately prior to procedure a time out was called to verify the correct patient, procedure, equipment, support staff and site/side marked as required. Patient was prepped and draped in the usual sterile fashion.       -Plan for left shoulder arthroscopy with lysis of adhesions with manipulation under anesthesia   After a lengthy discussion of treatment options, including risks, benefits, alternatives, complications of surgical and nonsurgical conservative options, the patient elected surgical repair.   The patient  is aware of the material risks  and complications including, but not limited to injury to adjacent structures,  neurovascular injury, infection, numbness, bleeding, implant failure, thermal burns, stiffness, persistent pain, failure to heal, disease transmission from allograft, need for further surgery, dislocation, anesthetic risks, blood clots, risks of death,and others. The probabilities of surgical success and failure discussed with patient given their particular co-morbidities.The time and nature of expected rehabilitation and recovery was discussed.The patient's questions were all answered preoperatively.  No barriers to understanding were noted. I explained the natural history of the disease process and Rx rationale.  I explained to the patient what I considered to be reasonable expectations given their personal situation.  The final treatment plan was arrived at through a shared patient decision making process model.

## 2022-12-12 ENCOUNTER — Telehealth: Payer: Self-pay | Admitting: Gastroenterology

## 2022-12-12 NOTE — Telephone Encounter (Signed)
PT is scheduled for OV 8/15 and wants to speak to nurse about symptoms. She feels really bad and doesn't want to get anyone else sick. Please advise.

## 2022-12-12 NOTE — Telephone Encounter (Signed)
Patient advised. She asks for a different appointment date. Appointment moved to 12/19/22 at 9:00 am. Patient understands this is an in office visit and to arrive at least 10 minutes before the scheduled appointment time.

## 2022-12-12 NOTE — Telephone Encounter (Signed)
Brooke Floyd, I have never seen patient  and she has not been seen by Dr. Lavon Paganini since 2019 therefore I would not recommend a virtual visit. She can contact her PCP for anti emetic tx until her appointment with Dr. Lavon Paganini.

## 2022-12-12 NOTE — Telephone Encounter (Signed)
Patient late seen in 2019 in office. Calls today with complaints of nausea, "stomach swelled" and "feels sick especially after I eat something fried." Asked if she takes any type of medication for acid suppression. Patient responds "they don't work. I have tried all that." Encouraged the patient to keep her appointment. Patient asks for a virtual appointment.  Do you do virtual appointments?

## 2022-12-13 ENCOUNTER — Encounter: Payer: Self-pay | Admitting: Gastroenterology

## 2022-12-19 ENCOUNTER — Ambulatory Visit (INDEPENDENT_AMBULATORY_CARE_PROVIDER_SITE_OTHER): Payer: BC Managed Care – PPO | Admitting: Gastroenterology

## 2022-12-19 ENCOUNTER — Encounter: Payer: Self-pay | Admitting: Gastroenterology

## 2022-12-19 VITALS — BP 110/70 | HR 94 | Ht 62.0 in | Wt 134.0 lb

## 2022-12-19 DIAGNOSIS — R112 Nausea with vomiting, unspecified: Secondary | ICD-10-CM | POA: Diagnosis not present

## 2022-12-19 DIAGNOSIS — Z86010 Personal history of colonic polyps: Secondary | ICD-10-CM

## 2022-12-19 DIAGNOSIS — K219 Gastro-esophageal reflux disease without esophagitis: Secondary | ICD-10-CM | POA: Diagnosis not present

## 2022-12-19 MED ORDER — PANTOPRAZOLE SODIUM 40 MG PO TBEC: 40.0000 mg | DELAYED_RELEASE_TABLET | Freq: Every day | ORAL | 1 refills | Status: DC

## 2022-12-19 NOTE — Progress Notes (Signed)
Chief Complaint: Nausea and vomiting, GERD Primary GI MD: Dr. Lavon Paganini  HPI: 57 year old female with medical history as listed below presents for evaluation of nausea vomiting and GERD.  Patient states over the last few months she has struggled to tolerate certain foods.  States most of the time when she eats something (like a sauce or something acidic) she will have nausea and vomiting and can sometimes taste the acid.  She is not on a PPI or H2 blocker.  She tried Pepto-Bismol with no relief.  She notes in the middle the night if she gets up to drink water and lies back down she can taste the acid coming back up her esophagus.  Patient denies NSAID use.  Denies previous EGDs.  Scheduled for a colonoscopy 12/23/2022   PREVIOUS GI WORKUP   Last colonoscopy 11/23/2016 - 7 mm tubular adenoma in ascending colon - Otherwise normal  Past Medical History:  Diagnosis Date   Asthma    GERD (gastroesophageal reflux disease)    Hyperlipidemia    Hypertension    Thrombocytosis 02/08/2017   Type 2 diabetes mellitus (HCC)    insulin dependent    Past Surgical History:  Procedure Laterality Date   COMBINED HYSTERECTOMY ABDOMINAL W/ A&P REPAIR / OOPHORECTOMY     WRIST ARTHROSCOPY      Current Outpatient Medications  Medication Sig Dispense Refill   albuterol (VENTOLIN HFA) 108 (90 Base) MCG/ACT inhaler Inhale 2 puffs into the lungs every 6 (six) hours as needed for wheezing or shortness of breath. 18 g 12   Blood Glucose Monitoring Suppl (FREESTYLE FREEDOM LITE) w/Device KIT check blood sugar up to 3 times a day 1 each 1   FARXIGA 10 MG TABS tablet Take 10 mg by mouth every morning.     glipiZIDE (GLUCOTROL) 10 MG tablet SMARTSIG:1 Tablet(s) By Mouth Morning-Night     glucose blood (FREESTYLE LITE) test strip Test blood sugar 3 times a day 100 each 12   hydrochlorothiazide (MICROZIDE) 12.5 MG capsule Take 1 capsule (12.5 mg total) by mouth daily. 90 capsule 3   HYDROmorphone (DILAUDID)  2 MG tablet Take 1 tablet (2 mg total) by mouth every 4 (four) hours as needed for severe pain. 10 tablet 0   Insulin Pen Needle (PEN NEEDLES 3/16") 31G X 5 MM MISC Use to inject Lantus at bedtime. The patient is insulin requiring, ICD 10 code E11.65. The patient injects 1 times per day. 100 each 6   LANTUS SOLOSTAR 100 UNIT/ML Solostar Pen SMARTSIG:35 Unit(s) SUB-Q Daily     lisinopril (ZESTRIL) 40 MG tablet TAKE 1 TABLET (40 MG TOTAL) BY MOUTH DAILY (AM) 30 tablet 2   loratadine (CLARITIN) 10 MG tablet Take 10 mg by mouth every morning.     montelukast (SINGULAIR) 10 MG tablet Take by mouth.     nortriptyline (PAMELOR) 25 MG capsule Take by mouth.     prednisoLONE acetate (PRED FORTE) 1 % ophthalmic suspension      promethazine (PHENERGAN) 25 MG tablet Take 25 mg by mouth every 8 (eight) hours as needed.     rOPINIRole (REQUIP) 1 MG tablet Take by mouth.     rosuvastatin (CRESTOR) 20 MG tablet TAKE 1 TABLET (20 MG TOTAL) BY MOUTH DAILY. 30 tablet 2   No current facility-administered medications for this visit.    Allergies as of 12/19/2022 - Review Complete 12/19/2022  Allergen Reaction Noted   Diclofenac sodium Anaphylaxis 01/11/2008   Voltaren [diclofenac] Anaphylaxis 11/11/2016   Hydrocodone  Itching 11/11/2016   Penicillins Other (See Comments) 01/11/2008   Codeine Itching 02/19/2016    Family History  Problem Relation Age of Onset   Diabetes Mother    Coronary artery disease Father    Diabetes Father    Breast cancer Neg Hx    Colon cancer Neg Hx    Colon polyps Neg Hx    Esophageal cancer Neg Hx    Rectal cancer Neg Hx    Stomach cancer Neg Hx     Social History   Socioeconomic History   Marital status: Married    Spouse name: Not on file   Number of children: 2   Years of education: 14   Highest education level: Not on file  Occupational History   Occupation: CNA  Tobacco Use   Smoking status: Never   Smokeless tobacco: Never  Vaping Use   Vaping status:  Never Used  Substance and Sexual Activity   Alcohol use: No    Alcohol/week: 0.0 standard drinks of alcohol   Drug use: No   Sexual activity: Not Currently    Birth control/protection: Surgical  Other Topics Concern   Not on file  Social History Narrative   Not on file   Social Determinants of Health   Financial Resource Strain: Not on file  Food Insecurity: No Food Insecurity (06/25/2021)   Received from [facility], [facility]   Hunger Vital Sign    Worried About Running Out of Food in the Last Year: Never true    Ran Out of Food in the Last Year: Never true  Transportation Needs: Not on file  Physical Activity: Not on file  Stress: Not on file  Social Connections: Unknown (09/19/2021)   Received from [facility], [facility]   Social Network    Social Network: Not on file  Intimate Partner Violence: Unknown (08/10/2021)   Received from [employer], [facility]   HITS    Physically Hurt: Not on file    Insult or Talk Down To: Not on file    Threaten Physical Harm: Not on file    Scream or Curse: Not on file    Review of Systems:    Constitutional: No weight loss, fever, chills, weakness or fatigue HEENT: Eyes: No change in vision               Ears, Nose, Throat:  No change in hearing or congestion Skin: No rash or itching Cardiovascular: No chest pain, chest pressure or palpitations   Respiratory: No SOB or cough Gastrointestinal: See HPI and otherwise negative Genitourinary: No dysuria or change in urinary frequency Neurological: No headache, dizziness or syncope Musculoskeletal: No new muscle or joint pain Hematologic: No bleeding or bruising Psychiatric: No history of depression or anxiety    Physical Exam:  Vital signs: BP 110/70   Pulse 94   Ht 5\' 2"  (1.575 m)   Wt 60.8 kg   BMI 24.51 kg/m   Constitutional: NAD, Well developed, Well nourished, alert and cooperative Head:  Normocephalic and atraumatic. Eyes:   PEERL, EOMI. No icterus.  Conjunctiva pink. Respiratory: Respirations even and unlabored. Lungs clear to auscultation bilaterally.   No wheezes, crackles, or rhonchi.  Cardiovascular:  Regular rate and rhythm. No peripheral edema, cyanosis or pallor.  Gastrointestinal:  Soft, nondistended, nontender. No rebound or guarding. Normal bowel sounds. No appreciable masses or hepatomegaly. Rectal:  Not performed.  Msk:  Symmetrical without gross deformities. Without edema, no deformity or joint abnormality.  Neurologic:  Alert and  oriented x4;  grossly normal neurologically.  Skin:   Dry and intact without significant lesions or rashes. Psychiatric: Oriented to person, place and time. Demonstrates good judgement and reason without abnormal affect or behaviors.   RELEVANT LABS AND IMAGING: CBC    Component Value Date/Time   WBC 3.3 (L) 08/20/2021 1129   WBC 5.2 12/19/2012 1418   RBC 3.82 08/20/2021 1129   RBC 4.24 12/19/2012 1418   HGB 11.4 08/20/2021 1129   HCT 35.2 08/20/2021 1129   PLT 402 08/20/2021 1129   MCV 92 08/20/2021 1129   MCH 29.8 08/20/2021 1129   MCH 29.7 12/19/2012 1418   MCHC 32.4 08/20/2021 1129   MCHC 32.6 12/19/2012 1418   RDW 11.8 08/20/2021 1129   LYMPHSABS 1.0 08/20/2021 1129   EOSABS 0.2 08/20/2021 1129   BASOSABS 0.0 08/20/2021 1129    CMP     Component Value Date/Time   NA 138 08/20/2021 1129   K 5.2 08/20/2021 1129   CL 98 08/20/2021 1129   CO2 26 08/20/2021 1129   GLUCOSE 241 (H) 08/20/2021 1129   GLUCOSE 202 (H) 06/13/2012 1220   BUN 22 08/20/2021 1129   CREATININE 0.99 08/20/2021 1129   CREATININE 0.72 06/13/2012 1220   CALCIUM 10.1 08/20/2021 1129   PROT 6.9 08/20/2021 1129   ALBUMIN 4.7 08/20/2021 1129   AST 27 08/20/2021 1129   ALT 23 08/20/2021 1129   ALKPHOS 66 08/20/2021 1129   BILITOT 0.2 08/20/2021 1129   GFRNONAA 90 11/15/2017 1427   GFRNONAA >89 06/13/2012 1220   GFRAA 104 11/15/2017 1427   GFRAA >89 06/13/2012 1220     Assessment/Plan:    Gastroesophageal reflux disease, unspecified whether esophagitis present Nausea and vomiting, unspecified vomiting type DDx includes esophagitis, gastritis, PUD.  Offered EGD for further evaluation and patient elected for medical management and if unable to manage medically she will proceed with EGD.  Symptoms worsened with acidic foods.  Denied early satiety.  Less suspicious for gastroparesis. --- Protonix 40 Mg once daily --- Educated patient on lifestyle modifications --- Provided patient education handout --- If no improvement can increase Protonix to twice daily.  If no improvement after twice daily we will move forward with EGD.  History of colon polyps --- Scheduled for colonoscopy  Lara Mulch [facility] 12/19/2022, 9:07 AM  Cc: Barbette Merino, NP

## 2022-12-19 NOTE — Patient Instructions (Signed)
We have sent the following medications to your pharmacy for you to pick up at your convenience: Pantoprazole 40 mg  _______________________________________________________  If your blood pressure at your visit was 140/90 or greater, please contact your primary care physician to follow up on this.  _______________________________________________________  If you are age 57 or older, your body mass index should be between 23-30. Your Body mass index is 24.51 kg/m. If this is out of the aforementioned range listed, please consider follow up with your Primary Care Provider.  If you are age 88 or younger, your body mass index should be between 19-25. Your Body mass index is 24.51 kg/m. If this is out of the aformentioned range listed, please consider follow up with your Primary Care Provider.   ________________________________________________________  The [facility] providers would like to encourage you to use [facility] to communicate with providers for non-urgent requests or questions.  Due to long hold times on the telephone, sending your provider a message by [facility] may be a faster and more efficient way to get a response.  Please allow 48 business hours for a response.  Please remember that this is for non-urgent requests.  _______________________________________________________   I appreciate the  opportunity to care for you  Thank You   [facility]

## 2022-12-20 ENCOUNTER — Telehealth: Payer: Self-pay | Admitting: Orthopaedic Surgery

## 2022-12-20 NOTE — Telephone Encounter (Signed)
Priscilla from [place] called. Needs the retrictions for patient, cb # [phone]

## 2022-12-21 NOTE — Telephone Encounter (Signed)
Yes, there is auth on file. Thanks!

## 2022-12-21 NOTE — Telephone Encounter (Signed)
Agree. Thanks

## 2022-12-22 ENCOUNTER — Ambulatory Visit: Payer: BC Managed Care – PPO | Admitting: Nurse Practitioner

## 2022-12-23 ENCOUNTER — Encounter: Payer: Self-pay | Admitting: Gastroenterology

## 2022-12-23 ENCOUNTER — Ambulatory Visit (AMBULATORY_SURGERY_CENTER): Payer: BC Managed Care – PPO | Admitting: Gastroenterology

## 2022-12-23 VITALS — BP 120/69 | HR 66 | Temp 96.2°F | Resp 11 | Ht 62.0 in | Wt 129.0 lb

## 2022-12-23 DIAGNOSIS — Z86010 Personal history of colonic polyps: Secondary | ICD-10-CM | POA: Diagnosis not present

## 2022-12-23 DIAGNOSIS — Z09 Encounter for follow-up examination after completed treatment for conditions other than malignant neoplasm: Secondary | ICD-10-CM | POA: Diagnosis present

## 2022-12-23 MED ORDER — SODIUM CHLORIDE 0.9 % IV SOLN: 500.0000 mL | Freq: Once | INTRAVENOUS | Status: DC

## 2022-12-23 NOTE — Op Note (Signed)
[facility] Patient Name: Brooke Floyd Procedure Date: 12/23/2022 8:27 AM MRN: [id number] Endoscopist: Napoleon Form , MD, [id number] Age: 57 Referring MD:  Date of Birth: 03-15-1966 Gender: Female Account #: [id number] Procedure:                Colonoscopy Indications:              High risk colon cancer surveillance: Personal                            history of colonic polyps, High risk colon cancer                            surveillance: Personal history of adenoma less than                            10 mm in size Medicines:                Monitored Anesthesia Care Procedure:                Pre-Anesthesia Assessment:                           - Prior to the procedure, a History and Physical                            was performed, and patient medications and                            allergies were reviewed. The patient's tolerance of                            previous anesthesia was also reviewed. The risks                            and benefits of the procedure and the sedation                            options and risks were discussed with the patient.                            All questions were answered, and informed consent                            was obtained. Prior Anticoagulants: The patient has                            taken no anticoagulant or antiplatelet agents. ASA                            Grade Assessment: II - A patient with mild systemic                            disease. After reviewing the risks and benefits,  the patient was deemed in satisfactory condition to                            undergo the procedure.                           After obtaining informed consent, the colonoscope                            was passed under direct vision. Throughout the                            procedure, the patient's blood pressure, pulse, and                            oxygen saturations were monitored  continuously. The                            Olympus PCF-H190DL (#[id number]) Colonoscope was                            introduced through the anus and advanced to the the                            cecum, identified by the ileocecal valve. The                            colonoscopy was performed without difficulty. The                            patient tolerated the procedure well. The quality                            of the bowel preparation was good. The ileocecal                            valve, appendiceal orifice, and rectum were                            photographed. Scope In: 9:17:47 AM Scope Out: 9:31:34 AM Scope Withdrawal Time: 0 hours 7 minutes 39 seconds  Total Procedure Duration: 0 hours 13 minutes 47 seconds  Findings:                 The perianal and digital rectal examinations were                            normal.                           A few small-mouthed diverticula were found in the                            sigmoid colon.  Non-bleeding external and internal hemorrhoids were                            found during retroflexion. The hemorrhoids were                            small. Complications:            No immediate complications. Estimated Blood Loss:     Estimated blood loss was minimal. Impression:               - Diverticulosis in the sigmoid colon.                           - Non-bleeding external and internal hemorrhoids.                           - No specimens collected. Recommendation:           - Patient has a contact number available for                            emergencies. The signs and symptoms of potential                            delayed complications were discussed with the                            patient. Return to normal activities tomorrow.                            Written discharge instructions were provided to the                            patient.                           - Resume previous  diet.                           - Continue present medications.                           - Repeat colonoscopy in 10 years for surveillance. Napoleon Form, MD 12/23/2022 9:37:50 AM This report has been signed electronically.

## 2022-12-23 NOTE — Progress Notes (Signed)
Patient was asked to take her bra off for the procedure but declined. She said we were not working in that area and did not need to do that. It was explained to her that it was in case of an emergency. She declined.

## 2022-12-23 NOTE — Progress Notes (Signed)
[facility] History and Physical   Primary Care Physician:  Barbette Merino, NP   Reason for Procedure:  History of adenomatous colon polyps  Plan:    Surveillance colonoscopy with possible interventions as needed     HPI: Brooke Floyd is a very pleasant 57 y.o. female here for surveillance colonoscopy. Denies any nausea, vomiting, abdominal pain, melena or bright red blood per rectum  The risks and benefits as well as alternatives of endoscopic procedure(s) have been discussed and reviewed. All questions answered. The patient agrees to proceed.    Past Medical History:  Diagnosis Date   Asthma    GERD (gastroesophageal reflux disease)    Hyperlipidemia    Hypertension    Thrombocytosis 02/08/2017   Type 2 diabetes mellitus (HCC)    insulin dependent    Past Surgical History:  Procedure Laterality Date   COMBINED HYSTERECTOMY ABDOMINAL W/ A&P REPAIR / OOPHORECTOMY     WRIST ARTHROSCOPY      Prior to Admission medications   Medication Sig Start Date End Date Taking? Authorizing Provider  albuterol (VENTOLIN HFA) 108 (90 Base) MCG/ACT inhaler Inhale 2 puffs into the lungs every 6 (six) hours as needed for wheezing or shortness of breath. 08/19/20  Yes Young, Joni Fears D, MD  Blood Glucose Monitoring Suppl (FREESTYLE FREEDOM LITE) w/Device KIT check blood sugar up to 3 times a day 09/05/18  Yes Svalina, Candida Peeling, MD  FARXIGA 10 MG TABS tablet Take 10 mg by mouth every morning. 10/28/22  Yes [provider]  glipiZIDE (GLUCOTROL) 10 MG tablet SMARTSIG:1 Tablet(s) By Mouth Morning-Night 10/28/22  Yes [provider]  glucose blood (FREESTYLE LITE) test strip Test blood sugar 3 times a day 08/29/18  Yes Nyra Market, MD  hydrochlorothiazide (MICROZIDE) 12.5 MG capsule Take 1 capsule (12.5 mg total) by mouth daily. 08/01/18  Yes Nyra Market, MD  Insulin Pen Needle (PEN NEEDLES 3/16") 31G X 5 MM MISC Use to inject Lantus at bedtime. The patient is  insulin requiring, ICD 10 code E11.65. The patient injects 1 times per day. 02/25/18  Yes Nyra Market, MD  LANTUS SOLOSTAR 100 UNIT/ML Solostar Pen SMARTSIG:35 Unit(s) SUB-Q Daily 11/22/22  Yes [provider]  lisinopril (ZESTRIL) 40 MG tablet TAKE 1 TABLET (40 MG TOTAL) BY MOUTH DAILY (AM) 12/29/21  Yes Ivonne Andrew, NP  loratadine (CLARITIN) 10 MG tablet Take 10 mg by mouth every morning. 10/28/22  Yes [provider]  montelukast (SINGULAIR) 10 MG tablet Take by mouth. 02/28/22  Yes [provider]  nortriptyline (PAMELOR) 25 MG capsule Take by mouth. 08/23/22  Yes [provider]  pantoprazole (PROTONIX) 40 MG tablet Take 1 tablet (40 mg total) by mouth daily. 12/19/22  Yes McMichael, Bayley M, PA-C  promethazine (PHENERGAN) 25 MG tablet Take 25 mg by mouth every 8 (eight) hours as needed. 12/06/22  Yes [provider]  rOPINIRole (REQUIP) 1 MG tablet Take by mouth. 12/28/21  Yes [provider]  rosuvastatin (CRESTOR) 20 MG tablet TAKE 1 TABLET (20 MG TOTAL) BY MOUTH DAILY. 10/29/21  Yes Ivonne Andrew, NP  HYDROmorphone (DILAUDID) 2 MG tablet Take 1 tablet (2 mg total) by mouth every 4 (four) hours as needed for severe pain. 11/11/22   Huel Cote, MD    Current Outpatient Medications  Medication Sig Dispense Refill   albuterol (VENTOLIN HFA) 108 (90 Base) MCG/ACT inhaler Inhale 2 puffs into the lungs every 6 (six) hours as needed for wheezing or shortness of  breath. 18 g 12   Blood Glucose Monitoring Suppl (FREESTYLE FREEDOM LITE) w/Device KIT check blood sugar up to 3 times a day 1 each 1   FARXIGA 10 MG TABS tablet Take 10 mg by mouth every morning.     glipiZIDE (GLUCOTROL) 10 MG tablet SMARTSIG:1 Tablet(s) By Mouth Morning-Night     glucose blood (FREESTYLE LITE) test strip Test blood sugar 3 times a day 100 each 12   hydrochlorothiazide (MICROZIDE) 12.5 MG capsule Take 1 capsule (12.5 mg total) by mouth daily. 90 capsule 3    Insulin Pen Needle (PEN NEEDLES 3/16") 31G X 5 MM MISC Use to inject Lantus at bedtime. The patient is insulin requiring, ICD 10 code E11.65. The patient injects 1 times per day. 100 each 6   LANTUS SOLOSTAR 100 UNIT/ML Solostar Pen SMARTSIG:35 Unit(s) SUB-Q Daily     lisinopril (ZESTRIL) 40 MG tablet TAKE 1 TABLET (40 MG TOTAL) BY MOUTH DAILY (AM) 30 tablet 2   loratadine (CLARITIN) 10 MG tablet Take 10 mg by mouth every morning.     montelukast (SINGULAIR) 10 MG tablet Take by mouth.     nortriptyline (PAMELOR) 25 MG capsule Take by mouth.     pantoprazole (PROTONIX) 40 MG tablet Take 1 tablet (40 mg total) by mouth daily. 30 tablet 1   promethazine (PHENERGAN) 25 MG tablet Take 25 mg by mouth every 8 (eight) hours as needed.     rOPINIRole (REQUIP) 1 MG tablet Take by mouth.     rosuvastatin (CRESTOR) 20 MG tablet TAKE 1 TABLET (20 MG TOTAL) BY MOUTH DAILY. 30 tablet 2   HYDROmorphone (DILAUDID) 2 MG tablet Take 1 tablet (2 mg total) by mouth every 4 (four) hours as needed for severe pain. 10 tablet 0   Current Facility-Administered Medications  Medication Dose Route Frequency Provider Last Rate Last Admin   0.9 %  sodium chloride infusion  500 mL Intravenous Once Napoleon Form, MD        Allergies as of 12/23/2022 - Review Complete 12/23/2022  Allergen Reaction Noted   Diclofenac sodium Anaphylaxis 01/11/2008   Voltaren [diclofenac] Anaphylaxis 11/11/2016   Hydrocodone Itching 11/11/2016   Penicillins Other (See Comments) 01/11/2008   Codeine Itching 02/19/2016    Family History  Problem Relation Age of Onset   Diabetes Mother    Coronary artery disease Father    Diabetes Father    Breast cancer Neg Hx    Colon cancer Neg Hx    Colon polyps Neg Hx    Esophageal cancer Neg Hx    Rectal cancer Neg Hx    Stomach cancer Neg Hx     Social History   Socioeconomic History   Marital status: Married    Spouse name: Not on file   Number of children: 2   Years of  education: 14   Highest education level: Not on file  Occupational History   Occupation: CNA  Tobacco Use   Smoking status: Never   Smokeless tobacco: Never  Vaping Use   Vaping status: Never Used  Substance and Sexual Activity   Alcohol use: No    Alcohol/week: 0.0 standard drinks of alcohol   Drug use: No   Sexual activity: Not Currently    Birth control/protection: Surgical  Other Topics Concern   Not on file  Social History Narrative   Not on file   Social Determinants of Health   Financial Resource Strain: Not on file  Food Insecurity: No Food Insecurity (  06/25/2021)   Received from [facility], [facility]   Hunger Vital Sign    Worried About Running Out of Food in the Last Year: Never true    Ran Out of Food in the Last Year: Never true  Transportation Needs: Not on file  Physical Activity: Not on file  Stress: Not on file  Social Connections: Unknown (09/19/2021)   Received from [facility], [facility]   Social Network    Social Network: Not on file  Intimate Partner Violence: Unknown (08/10/2021)   Received from [facility], [facility]   HITS    Physically Hurt: Not on file    Insult or Talk Down To: Not on file    Threaten Physical Harm: Not on file    Scream or Curse: Not on file    Review of Systems:  All other review of systems negative except as mentioned in the HPI.  Physical Exam: Vital signs in last 24 hours: BP 136/72   Pulse (!) 109   Temp (!) 96.2 F (35.7 C)   Ht 5\' 2"  (1.575 m)   Wt 129 lb (58.5 kg)   SpO2 98%   BMI 23.59 kg/m  General:   Alert, NAD Lungs:  Clear .   Heart:  Regular rate and rhythm Abdomen:  Soft, nontender and nondistended. Neuro/Psych:  Alert and cooperative. Normal mood and affect. A and O x 3  Reviewed labs, radiology imaging, old records and pertinent past GI work up  Patient is appropriate for planned procedure(s) and anesthesia in an ambulatory setting   K. Scherry Ran , MD [phone]

## 2022-12-23 NOTE — Patient Instructions (Signed)

## 2022-12-23 NOTE — Progress Notes (Signed)
Uneventful anesthetic. Report to pacu rn. Vss. Care resumed by rn. 

## 2022-12-23 NOTE — Progress Notes (Signed)
Pt's states no medical or surgical changes since previsit or office visit. 

## 2022-12-26 ENCOUNTER — Telehealth: Payer: Self-pay

## 2022-12-26 NOTE — Telephone Encounter (Signed)
  Follow up Call-     12/23/2022    8:34 AM  Call back number  Post procedure Call Back phone  # [phone]  Permission to leave phone message Yes    Follow up call, LVM

## 2023-01-02 LAB — HM DIABETES EYE EXAM

## 2023-02-02 ENCOUNTER — Ambulatory Visit: Admit: 2023-02-02 | Payer: BC Managed Care – PPO | Admitting: Orthopaedic Surgery

## 2023-02-02 SURGERY — ARTHROSCOPY, SHOULDER
Anesthesia: General | Site: Shoulder | Laterality: Left

## 2023-02-04 ENCOUNTER — Ambulatory Visit (HOSPITAL_BASED_OUTPATIENT_CLINIC_OR_DEPARTMENT_OTHER): Payer: BC Managed Care – PPO | Admitting: Physical Therapy

## 2023-02-08 ENCOUNTER — Encounter (HOSPITAL_BASED_OUTPATIENT_CLINIC_OR_DEPARTMENT_OTHER): Payer: BC Managed Care – PPO

## 2023-02-13 NOTE — Progress Notes (Signed)
Brooke Floyd    [id number]    Aug 14, 1965  Primary Care Physician:King, Shana Chute, NP  Referring Physician: Nyra Market, MD [address] [place], [place] [address]  Chief complaint:   Chief Complaint  Patient presents with   Gastroesophageal Reflux    Pt reports certain foods make her nauseous - sometimes leads to vomiting;  denies trouble swallowing except for certain large pills; she reports taking "enzymes" from the health food store that helps more than Pantoprazole   HPI: 17 yr F here for a f/u visit for GERD. Last seen on 12-19-22 by [facility] for GERD  Today, she complains of GERD, epigastric, and abdominal pain that has persisted for few years now. She states that certain food such as [place] food or certain sauces cause her to experience nausea and occasionally vomiting episodes. She denies any accompanying dysphagia. She states that taking 2 food enzymes with meals which seems to help with her symptoms. She typically has two meals a day. On Sundays, she has one meal and denies any snacking in between. She is also experiencing accompanying bloating as well.  She does complains of constipation but states that she has been able to have more BM after undergoing her colonoscopy few months ago. She has tried miralax before but states that did not help much. She is takes magnesium 400 mg daily, she states that taking 800 mg caused her to have diarrhea.   Patient denies any diarrhea, blood in stool, black stool, or unintentional weight loss.  Patient was distracted playing video games on her ipad during most of the visit.   GI Hx: Colonoscopy 12-23-22 - Diverticulosis in the sigmoid colon.  - Non-bleeding external and internal hemorrhoids.  - No specimens collected.  Colonoscopy 11-23-16 - One 7 mm polyp in the ascending colon, removed with a cold snare. Resected and retrieved.  - The examination was otherwise normal. Surgical [P], ascending, polyp -  TUBULAR ADENOMA. - NO HIGH GRADE DYSPLASIA OR MALIGNANCY.   Current Outpatient Medications:    albuterol (VENTOLIN HFA) 108 (90 Base) MCG/ACT inhaler, Inhale 2 puffs into the lungs every 6 (six) hours as needed for wheezing or shortness of breath., Disp: 18 g, Rfl: 12   Blood Glucose Monitoring Suppl (FREESTYLE FREEDOM LITE) w/Device KIT, check blood sugar up to 3 times a day, Disp: 1 each, Rfl: 1   FARXIGA 10 MG TABS tablet, Take 10 mg by mouth every morning., Disp: , Rfl:    glipiZIDE (GLUCOTROL) 10 MG tablet, SMARTSIG:1 Tablet(s) By Mouth Morning-Night, Disp: , Rfl:    glucose blood (FREESTYLE LITE) test strip, Test blood sugar 3 times a day, Disp: 100 each, Rfl: 12   hydrochlorothiazide (MICROZIDE) 12.5 MG capsule, Take 1 capsule (12.5 mg total) by mouth daily., Disp: 90 capsule, Rfl: 3   HYDROmorphone (DILAUDID) 2 MG tablet, Take 1 tablet (2 mg total) by mouth every 4 (four) hours as needed for severe pain., Disp: 10 tablet, Rfl: 0   Insulin Pen Needle (PEN NEEDLES 3/16") 31G X 5 MM MISC, Use to inject Lantus at bedtime. The patient is insulin requiring, ICD 10 code E11.65. The patient injects 1 times per day., Disp: 100 each, Rfl: 6   LANTUS SOLOSTAR 100 UNIT/ML Solostar Pen, SMARTSIG:35 Unit(s) SUB-Q Daily, Disp: , Rfl:    lisinopril (ZESTRIL) 40 MG tablet, TAKE 1 TABLET (40 MG TOTAL) BY MOUTH DAILY (AM), Disp: 30 tablet, Rfl: 2  loratadine (CLARITIN) 10 MG tablet, Take 10 mg by mouth every morning., Disp: , Rfl:    montelukast (SINGULAIR) 10 MG tablet, Take by mouth., Disp: , Rfl:    nortriptyline (PAMELOR) 25 MG capsule, Take by mouth., Disp: , Rfl:    pantoprazole (PROTONIX) 40 MG tablet, Take 1 tablet (40 mg total) by mouth daily., Disp: 30 tablet, Rfl: 1   promethazine (PHENERGAN) 25 MG tablet, Take 25 mg by mouth every 8 (eight) hours as needed., Disp: , Rfl:    rOPINIRole (REQUIP) 1 MG tablet, Take by mouth., Disp: , Rfl:    rosuvastatin (CRESTOR) 20 MG tablet, TAKE 1 TABLET (20  MG TOTAL) BY MOUTH DAILY., Disp: 30 tablet, Rfl: 2    Allergies as of 02/16/2023 - Review Complete 02/16/2023  Allergen Reaction Noted   Diclofenac sodium Anaphylaxis 01/11/2008   Voltaren [diclofenac] Anaphylaxis 11/11/2016   Hydrocodone Itching 11/11/2016   Penicillins Other (See Comments) 01/11/2008   Codeine Itching 02/19/2016    Past Medical History:  Diagnosis Date   Asthma    GERD (gastroesophageal reflux disease)    Hyperlipidemia    Hypertension    Thrombocytosis 02/08/2017   Type 2 diabetes mellitus (HCC)    insulin dependent    Past Surgical History:  Procedure Laterality Date   COMBINED HYSTERECTOMY ABDOMINAL W/ A&P REPAIR / OOPHORECTOMY     WRIST ARTHROSCOPY      Family History  Problem Relation Age of Onset   Diabetes Mother    Coronary artery disease Father    Diabetes Father    Breast cancer Neg Hx    Colon cancer Neg Hx    Colon polyps Neg Hx    Esophageal cancer Neg Hx    Rectal cancer Neg Hx    Stomach cancer Neg Hx     Social History   Socioeconomic History   Marital status: Married    Spouse name: Not on file   Number of children: 2   Years of education: 14   Highest education level: Not on file  Occupational History   Occupation: CNA  Tobacco Use   Smoking status: Never   Smokeless tobacco: Never  Vaping Use   Vaping status: Never Used  Substance and Sexual Activity   Alcohol use: No    Alcohol/week: 0.0 standard drinks of alcohol   Drug use: No   Sexual activity: Not Currently    Birth control/protection: Surgical  Other Topics Concern   Not on file  Social History Narrative   Not on file   Social Determinants of Health   Financial Resource Strain: Not on file  Food Insecurity: No Food Insecurity (06/25/2021)   Received from [facility], [facility]   Hunger Vital Sign    Worried About Running Out of Food in the Last Year: Never true    Ran Out of Food in the Last Year: Never true  Transportation Needs: Not on file   Physical Activity: Not on file  Stress: Not on file  Social Connections: Unknown (09/19/2021)   [facility], [facility]   Social Network    Social Network: Not on file  Intimate Partner Violence: Unknown (08/10/2021)   Received from [employer], [facility]   HITS    Physically Hurt: Not on file    Insult or Talk Down To: Not on file    Threaten Physical Harm: Not on file    Scream or Curse: Not on file    Review of systems: Review  of Systems  Constitutional:  Negative for unexpected weight change.  HENT:  Negative for trouble swallowing.   Gastrointestinal:  Positive for abdominal pain, constipation, nausea and vomiting. Negative for abdominal distention, anal bleeding, blood in stool, diarrhea and rectal pain.      Physical Exam: Vitals:   02/16/23 1028  BP: 120/70  Pulse: (!) 59    Body mass index is 23.41 kg/m. General: well-appearing   Eyes: sclera anicteric, no redness ENT: oral mucosa moist without lesions, no cervical or supraclavicular lymphadenopathy CV: RRR, no JVD, no peripheral edema Resp: clear to auscultation bilaterally, normal RR and effort noted GI: soft, no tenderness, with active bowel sounds. No guarding or palpable organomegaly noted. Skin; warm and dry, no rash or jaundice noted Neuro: awake, alert and oriented x 3. Normal gross motor function and fluent speech   Data Reviewed:  Reviewed labs, radiology imaging, old records and pertinent past GI work up  Colonoscopy 11/23/2016 - One 7 mm polyp in the ascending colon, removed with a cold snare. Resected and retrieved. - The examination was otherwise normal. - TUBULAR ADENOMA. - NO HIGH GRADE DYSPLASIA OR MALIGNANCY.  Assessment and Plan/Recommendations:  45 yr F with chronic GERD, sore throat , nausea, regurgitation and persistent breakthrough heartburn Symptoms are suggestive of uncontrolled GERD Patient is reluctant to take PPI, she feels they were not working and  making her symptoms worse  Chronic symptoms of stomach and esophageal discomfort, exacerbated by certain foods. Patient reports frequent vomiting. -Schedule abdominal ultrasound to exclude acute intra-abdominal pathology or gallbladder disease -Schedule an upper endoscopy to evaluate the esophagus and stomach for potential ulcers or other abnormalities. -Continue over-the-counter digestive enzymes as tolerated.  The risks and benefits as well as alternatives of endoscopic procedure(s) have been discussed and reviewed. All questions answered. The patient agrees to proceed. Discussed anti reflux measures and life style modifications in detail  Chronic Constipation Patient reports improved bowel movements post-colonoscopy, but still experiences constipation. Currently taking magnesium 400mg  daily. -Start Linzess daily for constipation, with potential to increase dose if ineffective. -Continue magnesium 400mg  daily as tolerated. -Schedule an abdominal ultrasound to evaluate liver and gallbladder.  Breast Pain Patient reports intermittent breast pain, potentially related to medication non-adherence. -Recommend follow-up with primary care provider or gynecologist for further evaluation and potential mammogram.  Follow-up Schedule follow-up appointment post-upper endoscopy to discuss results and adjust treatment plan as necessary.        Iona Beard , MD   CC: Barbette Merino, NP   Ladona Mow Hewitt Shorts as a scribe for Marsa Aris, MD.,have documented all relevant documentation on the behalf of Marsa Aris, MD,as directed by  Marsa Aris, MD while in the presence of Marsa Aris, MD.   I, Marsa Aris, MD, have reviewed all documentation for this visit. The documentation on 02/16/23 for the exam, diagnosis, procedures, and orders are all accurate and complete.

## 2023-02-16 ENCOUNTER — Ambulatory Visit (INDEPENDENT_AMBULATORY_CARE_PROVIDER_SITE_OTHER): Payer: BC Managed Care – PPO | Admitting: Gastroenterology

## 2023-02-16 ENCOUNTER — Encounter: Payer: Self-pay | Admitting: Gastroenterology

## 2023-02-16 VITALS — BP 120/70 | HR 59 | Ht 62.0 in | Wt 128.0 lb

## 2023-02-16 DIAGNOSIS — R1013 Epigastric pain: Secondary | ICD-10-CM | POA: Diagnosis not present

## 2023-02-16 DIAGNOSIS — R112 Nausea with vomiting, unspecified: Secondary | ICD-10-CM

## 2023-02-16 DIAGNOSIS — K581 Irritable bowel syndrome with constipation: Secondary | ICD-10-CM

## 2023-02-16 DIAGNOSIS — K219 Gastro-esophageal reflux disease without esophagitis: Secondary | ICD-10-CM | POA: Diagnosis not present

## 2023-02-16 DIAGNOSIS — R1011 Right upper quadrant pain: Secondary | ICD-10-CM

## 2023-02-16 NOTE — Patient Instructions (Addendum)
VISIT SUMMARY:  During your visit, we discussed your ongoing stomach and esophageal discomfort, constipation, and occasional breast pain. We believe your stomach discomfort may be due to Gastroesophageal Reflux Disease (GERD), a condition where stomach acid frequently flows back into the tube connecting your mouth and stomach (esophagus). Your constipation seems to have improved after your recent colonoscopy, but we will start you on a new medication, Linzess, to further help with this. Your breast pain will need further evaluation by your primary care provider or gynecologist.  YOUR PLAN:  -GASTROESOPHAGEAL REFLUX DISEASE (GERD): We will schedule an upper endoscopy, a procedure to look inside your esophagus and stomach, to check for any ulcers or other abnormalities. Please continue taking your over-the-counter digestive enzymes as they seem to be helping you.  -Right upper abdominal pain: We will schedule an abdominal ultrasound, a type of imaging test, to check your liver and gallbladder.   -CHRONIC CONSTIPATION: We will start you on a new medication, Linzess, to help with your constipation. You can continue taking your magnesium as tolerated.   -BREAST PAIN: Your breast pain needs further evaluation. I recommend you follow up with your primary care provider or gynecologist for further evaluation and a potential mammogram, which is an X-ray of the breast.  INSTRUCTIONS:  Please schedule a follow-up appointment after your upper endoscopy so we can discuss the results and adjust your treatment plan as necessary.  You have been scheduled for an endoscopy. Please follow written instructions given to you at your visit today.  If you use inhalers (even only as needed), please bring them with you on the day of your procedure.  If you take any of the following medications, they will need to be adjusted prior to your procedure:   DO NOT TAKE 7 DAYS PRIOR TO TEST- Trulicity (dulaglutide) Ozempic,  Wegovy (semaglutide) Mounjaro (tirzepatide) Bydureon Bcise (exanatide extended release)  DO NOT TAKE 1 DAY PRIOR TO YOUR TEST Rybelsus (semaglutide) Adlyxin (lixisenatide) Victoza (liraglutide) Byetta (exanatide) ___________________________________________________________________________     Due to recent changes in healthcare laws, you may see the results of your imaging and laboratory studies on MyChart before your provider has had a chance to review them.  We understand that in some cases there may be results that are confusing or concerning to you. Not all laboratory results come back in the same time frame and the provider may be waiting for multiple results in order to interpret others.  Please give [place] 48 hours in order for your provider to thoroughly review all the results before contacting the office for clarification of your results.    You will be contacted by [facility] Scheduling in the next 2 days to arrange a Abdominal Ultrasound.  The number on your caller ID will be [phone], please answer when they call.  If you have not heard from them in 2 days please call [phone] to schedule.     I appreciate the  opportunity to care for you  Thank You   Marsa Aris , MD

## 2023-02-17 ENCOUNTER — Encounter (HOSPITAL_BASED_OUTPATIENT_CLINIC_OR_DEPARTMENT_OTHER): Payer: BC Managed Care – PPO | Admitting: Orthopaedic Surgery

## 2023-02-17 ENCOUNTER — Encounter (HOSPITAL_BASED_OUTPATIENT_CLINIC_OR_DEPARTMENT_OTHER): Payer: BC Managed Care – PPO

## 2023-02-24 ENCOUNTER — Encounter (HOSPITAL_BASED_OUTPATIENT_CLINIC_OR_DEPARTMENT_OTHER): Payer: BC Managed Care – PPO | Admitting: Physical Therapy

## 2023-02-27 ENCOUNTER — Ambulatory Visit (HOSPITAL_COMMUNITY)
Admission: RE | Admit: 2023-02-27 | Discharge: 2023-02-27 | Disposition: A | Discharge status: Home / Self Care | Payer: BC Managed Care – PPO | Source: Ambulatory Visit | Attending: Gastroenterology | Admitting: Gastroenterology

## 2023-02-27 DIAGNOSIS — K581 Irritable bowel syndrome with constipation: Secondary | ICD-10-CM | POA: Diagnosis present

## 2023-02-27 DIAGNOSIS — K219 Gastro-esophageal reflux disease without esophagitis: Secondary | ICD-10-CM

## 2023-02-27 DIAGNOSIS — R1011 Right upper quadrant pain: Secondary | ICD-10-CM | POA: Diagnosis present

## 2023-02-27 DIAGNOSIS — R1013 Epigastric pain: Secondary | ICD-10-CM | POA: Diagnosis present

## 2023-02-27 DIAGNOSIS — R112 Nausea with vomiting, unspecified: Secondary | ICD-10-CM | POA: Diagnosis present

## 2023-02-28 ENCOUNTER — Encounter (HOSPITAL_BASED_OUTPATIENT_CLINIC_OR_DEPARTMENT_OTHER): Payer: BC Managed Care – PPO

## 2023-03-02 ENCOUNTER — Encounter (HOSPITAL_BASED_OUTPATIENT_CLINIC_OR_DEPARTMENT_OTHER): Payer: BC Managed Care – PPO | Admitting: Physical Therapy

## 2023-03-07 ENCOUNTER — Encounter (HOSPITAL_BASED_OUTPATIENT_CLINIC_OR_DEPARTMENT_OTHER): Payer: BC Managed Care – PPO | Admitting: Physical Therapy

## 2023-03-09 ENCOUNTER — Encounter (HOSPITAL_BASED_OUTPATIENT_CLINIC_OR_DEPARTMENT_OTHER): Payer: BC Managed Care – PPO | Admitting: Physical Therapy

## 2023-03-29 ENCOUNTER — Encounter: Payer: BC Managed Care – PPO | Admitting: Gastroenterology

## 2023-04-10 ENCOUNTER — Encounter (HOSPITAL_BASED_OUTPATIENT_CLINIC_OR_DEPARTMENT_OTHER): Payer: BC Managed Care – PPO | Admitting: Orthopaedic Surgery

## 2023-04-12 ENCOUNTER — Encounter (HOSPITAL_BASED_OUTPATIENT_CLINIC_OR_DEPARTMENT_OTHER): Payer: BC Managed Care – PPO | Admitting: Orthopaedic Surgery

## 2023-05-05 ENCOUNTER — Ambulatory Visit
Admission: RE | Admit: 2023-05-05 | Discharge: 2023-05-05 | Disposition: A | Discharge status: Home / Self Care | Payer: BC Managed Care – PPO | Source: Ambulatory Visit | Attending: Nurse Practitioner | Admitting: Nurse Practitioner

## 2023-05-05 ENCOUNTER — Ambulatory Visit: Payer: BC Managed Care – PPO

## 2023-05-05 DIAGNOSIS — Z1231 Encounter for screening mammogram for malignant neoplasm of breast: Secondary | ICD-10-CM

## 2023-05-11 ENCOUNTER — Encounter: Payer: Self-pay | Admitting: Internal Medicine

## 2023-05-11 ENCOUNTER — Ambulatory Visit: Payer: BC Managed Care – PPO | Admitting: Internal Medicine

## 2023-05-11 VITALS — BP 130/80 | HR 82 | Ht 62.0 in | Wt 126.2 lb

## 2023-05-11 DIAGNOSIS — R531 Weakness: Secondary | ICD-10-CM

## 2023-05-11 DIAGNOSIS — R059 Cough, unspecified: Secondary | ICD-10-CM | POA: Diagnosis not present

## 2023-05-11 DIAGNOSIS — R067 Sneezing: Secondary | ICD-10-CM | POA: Diagnosis not present

## 2023-05-11 LAB — POC INFLUENZA TEST: Negative: NEGATIVE

## 2023-05-11 MED ORDER — AZITHROMYCIN 250 MG PO TABS: ORAL_TABLET | ORAL | 0 refills | Status: DC

## 2023-05-11 NOTE — Patient Instructions (Addendum)
 Flu Test today  Script sent for Zpak  You can use Mucinex-DM to help cough  Stay well- hydrated  Ok to use throat lozenges and otc cough and cold remedies as needed

## 2023-05-11 NOTE — Progress Notes (Signed)
 Subjective:     Patient ID: Brooke Floyd, female   DOB: [birth year], 58 y.o.   MRN: [id number]  HPI  female never smoker followed for asthma complicated by GERD, DM, GERD, HBP, restless legs PFT 04/26/17-Mild to moderate obstructive airways disease with significant response to bronchodilator, mild restriction of total lung capacity, mild reduction of diffusion.  FVC 1.89/71%, FEV1 1.70/80%, ratio 0.90, TLC 71%, DLCO 64%. ---------------------------------------------------------------------------------------------------   04/28/21- 58 year old female never smoker followed for Asthma complicated by GERD, DM2, HBP, restless legs, Blind L eye,  -ropinirole  1 mg, Singulair , Trelegy 100, albuterol  hfa, neb albuterol , Tessalon ,  Note- ACEI quinapril , timolol, Covid vax-3 Phizer Flu vax-had We sent prednisone  taper in August. ACT score - 20 Ran out of Trelegy. Trelegy was helpful.  Would like it refilled.  Using nebulizer machine about twice a month, rescue inhaler 3 times a month.  Has avoided COVID infection.  She says she had cataract, not glaucoma. Recent abnormal mammogram right breast pending ultrasound. Working as public librarian. CXR 10/02/20-  IMPRESSION: No active cardiopulmonary disease.  05/11/23- 58 year old female never smoker followed for Asthma complicated by GERD, DM2, HBP, restless legs, Blind L eye,  -ropinirole  1 mg, Singulair , Trelegy 100, albuterol  hfa, neb albuterol , Tessalon ,  Note- ACEI lisinopril , albuterol  hfa, singulair  -----Cough and sneezing for 4 days. Reports home covid test neg. Flu swab test here negative. Discussed trial Mucinex-DM instead of prometh  guaifenesin/ DM. CXR 10/02/20 IMPRESSION: No active cardiopulmonary disease. Discussed the use of AI scribe software for clinical note transcription with the patient, who gave verbal consent to proceed. History of Present Illness   The patient presents with a 4-day history of upper respiratory  symptoms, including nasal and chest congestion, backache, and leg muscle aches. She reports feeling hot but did not check her temperature. She denies a sore throat but describes a headache across the front of her head. She has been coughing but not producing sputum from her chest. She has had blood-tinged mucus from one nostril. She has not noticed any lumps or swollen glands and denies gastrointestinal symptoms. She has been taking Nyquil and Benadryl to help with symptoms and sleep. She has a longstanding history of sleep problems and has been taking melatonin. She has not taken any over-the-counter cold or flu remedies other than Nyquil and has not taken any antibiotics recently. She caught this illness from her son, who is now feeling better.     ROS-see HPI   + = positive Constitutional:   No-   weight loss, night sweats, fevers, chills, fatigue, lassitude.  HEENT:   + headaches, difficulty swallowing, tooth/dental problems, sore throat,       No-  sneezing, itching, ear ache,+ nasal congestion, post nasal drip,  CV:  No-   chest pain, orthopnea, PND, swelling in lower extremities, anasarca, dizziness, palpitations Resp: +   shortness of breath with exertion or at rest.              No-   productive cough, +non-productive cough,  No- coughing up of blood.              No-   change in color of mucus.  + wheezing.   Skin: No-   rash or lesions. GI:  +heartburn, indigestion, abdominal pain, nausea, vomiting,  GU:  MS:  No-   joint pain or swelling.  . Neuro-     nothing unusual Psych:  No- change in mood or affect. No depression or anxiety.  No memory  loss.  OBJ- Physical Exam General- Alert, Oriented, Affect-appropriate, Distress- none acute.  Skin- rash-none, lesions- none, excoriation- none Lymphadenopathy- tender L axilla c/w hydradinitis Head- atraumatic            Eyes- Gross vision intact, PERRLA, conjunctivae and secretions clear            Ears- Hearing, canals-normal             Nose- Clear, no-Septal dev, mucus, polyps, erosion, perforation             Throat- Mallampati III-IV , mucosa+ lightly coated, drainage- none, tonsils- atrophic , + teeth Neck- flexible , trachea midline, no stridor , thyroid  nl, carotid no bruit Chest - symmetrical excursion , unlabored           Heart/CV- RRR , no murmur , no gallop  , no rub, nl s1 s2                           - JVD- none , edema- none, stasis changes- none, varices- none           Lung- clear, wheeze- none, cough- none , dullness-none, rub- none, unlabored           Chest wall-  Abd-  Br/ Gen/ Rectal- Not done, not indicated Extrem- cyanosis- none, clubbing, none, atrophy- none, strength- nl Neuro- grossly intact to observation  Assessment and Plan    Upper Respiratory Infection Symptoms include nasal and chest congestion, backache, muscle aches, and mild frontal headache. No fever or sore throat reported. Nasal discharge includes blood-tinged mucus from one nostril. No wheezing or rattling in the chest. Flu test initiated. -Prescribe Z-Pak (Azithromycin ) [facility] [address]. -Recommend over-the-counter Mucinex DM for chest congestion/ cough.  Insomnia Chronic issue, exacerbated by current illness. Patient has been self-medicating with Nyquil, Benadryl, and Sweet Dreams. -Prescribe Promethazine  with Guaifenesin to aid with sleep and cough. -Continue Melatonin as previously used for chronic insomnia.  General Health Maintenance -Consider flu vaccination once recovered from current illness. -Schedule follow-up in 1 year to keep chart open, sooner if needed.

## 2023-05-15 ENCOUNTER — Telehealth: Payer: Self-pay | Admitting: Internal Medicine

## 2023-05-15 NOTE — Telephone Encounter (Signed)
 PT still sick despite Dr. Roxy Cedar treatment plan on her last visit. Please call to advise. [phone]

## 2023-05-15 NOTE — Telephone Encounter (Signed)
 Pt states her nose is still running, chest congestion and ears clogged. Using mucinex, and zpak is finished.

## 2023-05-16 ENCOUNTER — Telehealth: Payer: Self-pay

## 2023-05-16 ENCOUNTER — Other Ambulatory Visit: Payer: Self-pay | Admitting: Nurse Practitioner

## 2023-05-16 DIAGNOSIS — R928 Other abnormal and inconclusive findings on diagnostic imaging of breast: Secondary | ICD-10-CM

## 2023-05-16 MED ORDER — DOXYCYCLINE HYCLATE 100 MG PO TABS: 100.0000 mg | ORAL_TABLET | Freq: Two times a day (BID) | ORAL | 0 refills | Status: DC

## 2023-05-16 MED ORDER — PREDNISONE 20 MG PO TABS: 20.0000 mg | ORAL_TABLET | Freq: Every day | ORAL | 0 refills | Status: DC

## 2023-05-16 NOTE — Telephone Encounter (Signed)
 I sent scripts for prednisone, 20 mg daily x 5 days, and doxycycline 100 mg twice daily x 7 days, to [facility].

## 2023-05-16 NOTE — Telephone Encounter (Signed)
 Let PT know we called in RX. NFN

## 2023-05-16 NOTE — Telephone Encounter (Signed)
 LMTCB x 1

## 2023-05-16 NOTE — Telephone Encounter (Signed)
 Patient called to schedule appointment with [facility]. Patient has [employer] coverage. Informed patient [facility] with back with scheduling update.

## 2023-05-17 NOTE — Telephone Encounter (Signed)
 Telephoned patient at mobile number. Left a voice message with [facility] scheduling contact information.

## 2023-06-28 ENCOUNTER — Ambulatory Visit: Payer: BC Managed Care – PPO

## 2023-06-28 ENCOUNTER — Other Ambulatory Visit: Payer: BC Managed Care – PPO

## 2023-06-28 ENCOUNTER — Ambulatory Visit
Admission: RE | Admit: 2023-06-28 | Discharge: 2023-06-28 | Disposition: A | Discharge status: Home / Self Care | Payer: BC Managed Care – PPO | Source: Ambulatory Visit | Attending: Nurse Practitioner | Admitting: Nurse Practitioner

## 2023-06-28 DIAGNOSIS — R928 Other abnormal and inconclusive findings on diagnostic imaging of breast: Secondary | ICD-10-CM

## 2023-07-06 LAB — HM DIABETES EYE EXAM

## 2023-07-22 ENCOUNTER — Emergency Department (HOSPITAL_COMMUNITY)

## 2023-07-22 ENCOUNTER — Emergency Department (HOSPITAL_COMMUNITY)
Admission: EM | Admit: 2023-07-22 | Discharge: 2023-07-22 | Disposition: A | Discharge status: Home / Self Care | Attending: Emergency Medicine | Admitting: Emergency Medicine

## 2023-07-22 DIAGNOSIS — R059 Cough, unspecified: Secondary | ICD-10-CM | POA: Diagnosis present

## 2023-07-22 DIAGNOSIS — I10 Essential (primary) hypertension: Secondary | ICD-10-CM | POA: Insufficient documentation

## 2023-07-22 DIAGNOSIS — E119 Type 2 diabetes mellitus without complications: Secondary | ICD-10-CM | POA: Diagnosis not present

## 2023-07-22 DIAGNOSIS — J069 Acute upper respiratory infection, unspecified: Secondary | ICD-10-CM | POA: Insufficient documentation

## 2023-07-22 DIAGNOSIS — J45909 Unspecified asthma, uncomplicated: Secondary | ICD-10-CM | POA: Insufficient documentation

## 2023-07-22 LAB — RESP PANEL BY RT-PCR (RSV, FLU A&B, COVID)  RVPGX2
Influenza A by PCR: NEGATIVE
Influenza B by PCR: NEGATIVE
Resp Syncytial Virus by PCR: NEGATIVE
SARS Coronavirus 2 by RT PCR: NEGATIVE

## 2023-07-22 LAB — GROUP A STREP BY PCR: Group A Strep by PCR: NOT DETECTED

## 2023-07-22 MED ORDER — PREDNISONE 10 MG PO TABS: ORAL_TABLET | ORAL | 0 refills | Status: AC

## 2023-07-22 MED ORDER — PREDNISONE 10 MG PO TABS: ORAL_TABLET | ORAL | 0 refills | Status: DC

## 2023-07-22 MED ORDER — FLUTICASONE PROPIONATE 50 MCG/ACT NA SUSP
2.0000 | Freq: Every day | NASAL | Status: DC
  Administered 2023-07-22: 2 via NASAL
  Filled 2023-07-22: qty 16

## 2023-07-22 NOTE — ED Notes (Signed)
`  Pt reports sore throat and "phlegm in my chest. I need it suctioned out". No SHOB noted

## 2023-07-22 NOTE — Discharge Instructions (Addendum)
 You appear to have an upper respiratory infection (URI). An upper respiratory tract infection, or cold, is a viral infection of the air passages leading to the lungs. It should improve gradually after 5-7 days. You may have a lingering cough that lasts for 2- 4 weeks after the infection.  Your strep, flu, covid, and RSV test were negative today.  Your chest x-ray did not show any signs of pneumonia.  You are given a nasal spray called Flonase here today to help with your congestion.  You may use 2 puffs in each nostril daily to help with congestion.  You have been prescribed a steroid taper. Please take this in the taper format as prescribed. Take this medication in the morning with breakfast as it can keep you up at night. If you have a history of elevated blood sugars or diabetes, please monitor your blood sugars closely as this medication can cause them to rise.   There are no medications, such as antibiotics, that will cure your infection.   Home care instructions:  You can take Tylenol and/or Ibuprofen as directed on the packaging for fever reduction and pain relief.    For cough: honey 1/2 to 1 teaspoon (you can dilute the honey in water or another fluid).  You can also use guaifenesin and dextromethorphan for cough which are over-the-counter medications. You can use a humidifier for chest congestion and cough.  If you don't have a humidifier, you can sit in the bathroom with the hot shower running.      For sore throat: try warm salt water gargles, cepacol lozenges, throat spray, warm tea or water with lemon/honey, popsicles or ice, or OTC cold relief medicine for throat discomfort.    For congestion: Flonase 1-2 sprays in each nostril daily.    It is important to stay hydrated: drink plenty of fluids (water, gatorade/powerade/pedialyte, juices, or teas) to keep your throat moisturized and help further relieve irritation/discomfort.   Your illness is contagious and can be spread to  others, especially during the first 3 or 4 days. It cannot be cured by antibiotics or other medicines. Take basic precautions such as washing your hands often, covering your mouth when you cough or sneeze, and avoiding public places where you could spread your illness to others.   Follow-up instructions: Please follow-up with your primary care provider for further evaluation of your symptoms if you are not feeling better within the next 5 days.   Return instructions:  Please return to the Emergency Department if you experience worsening symptoms.  RETURN IMMEDIATELY IF you develop shortness of breath, confusion or altered mental status, a new rash, become dizzy, faint, or poorly responsive, or are unable to be cared for at home. Please return if you have persistent vomiting and cannot keep down fluids or develop a fever that is not controlled by tylenol or motrin.   Please return if you have any other emergent concerns.

## 2023-07-22 NOTE — ED Provider Notes (Signed)
 [place] EMERGENCY DEPARTMENT AT [facility] Provider Note   CSN: [id number] Arrival date & time: 07/22/23  2014     History  Chief Complaint  Patient presents with   URI    Brooke Floyd is a 58 y.o. female with history of hypertension, asthma, type 2 diabetes, presents with concern for congestion, dry cough, and sore throat ongoing for the past 3 days.  Reports her husband was sick and she thinks she got something from him.  She denies any fever or chills at home.  Denies any abdominal pain, nausea or vomiting.     URI Presenting symptoms: cough        Home Medications Prior to Admission medications   Medication Sig Start Date End Date Taking? Authorizing Provider  albuterol (VENTOLIN HFA) 108 (90 Base) MCG/ACT inhaler Inhale 2 puffs into the lungs every 6 (six) hours as needed for wheezing or shortness of breath. 08/19/20   Waymon Budge, MD  doxycycline (VIBRA-TABS) 100 MG tablet Take 1 tablet (100 mg total) by mouth 2 (two) times daily. 05/16/23   Waymon Budge, MD  predniSONE (DELTASONE) 10 MG tablet Take 4 tablets (40 mg total) by mouth daily with breakfast for 2 days, THEN 3 tablets (30 mg total) daily with breakfast for 2 days, THEN 2 tablets (20 mg total) daily with breakfast for 2 days, THEN 1 tablet (10 mg total) daily with breakfast for 1 day. 07/22/23 07/29/23  Arabella Merles, PA-C      Allergies    Diclofenac sodium, Voltaren [diclofenac], Hydrocodone, Penicillins, and Codeine    Review of Systems   Review of Systems  Constitutional:  Negative for chills.  Respiratory:  Positive for cough.     Physical Exam Updated Vital Signs BP 116/80   Pulse 94   Temp 99 F (37.2 C)   Resp 19   SpO2 98%  Physical Exam Vitals and nursing note reviewed.  Constitutional:      General: She is not in acute distress.    Appearance: She is well-developed.  HENT:     Head: Normocephalic and atraumatic.     Mouth/Throat:     Pharynx: No  oropharyngeal exudate or posterior oropharyngeal erythema.  Eyes:     Conjunctiva/sclera: Conjunctivae normal.  Cardiovascular:     Rate and Rhythm: Regular rhythm. Tachycardia present.     Heart sounds: No murmur heard. Pulmonary:     Effort: Pulmonary effort is normal. No respiratory distress.     Breath sounds: Normal breath sounds.  Abdominal:     Palpations: Abdomen is soft.     Tenderness: There is no abdominal tenderness.  Musculoskeletal:        General: No swelling.     Cervical back: Neck supple.  Lymphadenopathy:     Cervical: Cervical adenopathy present.  Skin:    General: Skin is warm and dry.     Capillary Refill: Capillary refill takes less than 2 seconds.  Neurological:     Mental Status: She is alert.  Psychiatric:        Mood and Affect: Mood normal.     ED Results / Procedures / Treatments   Labs (all labs ordered are listed, but only abnormal results are displayed) Labs Reviewed  RESP PANEL BY RT-PCR (RSV, FLU A&B, COVID)  [id number]  GROUP A STREP BY PCR    EKG None  Radiology DG Chest 2 View Result Date: 07/22/2023 CLINICAL DATA:  Cough, sore throat, chest congestion EXAM:  CHEST - 2 VIEW COMPARISON:  10/02/2020 FINDINGS: Stable cardiomediastinal silhouette. Aortic atherosclerotic calcification. No focal consolidation, pleural effusion, or pneumothorax. No displaced rib fractures. IMPRESSION: No acute cardiopulmonary disease. Electronically Signed   By: Minerva Fester M.D.   On: 07/22/2023 21:33    Procedures Procedures    Medications Ordered in ED Medications  fluticasone (FLONASE) 50 MCG/ACT nasal spray 2 spray (2 sprays Each Nare Given 07/22/23 2250)    ED Course/ Medical Decision Making/ A&P                                 Medical Decision Making Amount and/or Complexity of Data Reviewed Radiology: ordered.  Risk Prescription drug management.     Differential diagnosis includes but is not limited to COVID, flu, RSV, viral URI,  strep pharyngitis, viral pharyngitis, allergic rhinitis, pneumonia, bronchitis   ED Course:  Patient overall well-appearing, stable vital signs.  She does have a dry cough on exam, but lungs clear to auscultation bilaterally.  Chest x-ray was obtained which showed no acute abnormalities.  Given symptoms just started, afebrile, and lungs clear to auscultation, and chest x-ray without any acute findings, low concern for pneumonia at this time.  COVID, flu, and RSV negative.  Strep negative.  Suspect she has a viral URI.  I discussed these findings with the patient.  She reports that her congestion is her main concern at this time. She was given Flonase here today to help with nasal congestion.  She asked for a Z-Pak, but explained that this is likely viral in nature and she did not have any signs of pneumonia on chest x-ray, no indication for antibiotics at this time. Will prescribe short steroid taper to help with symptoms.   She is tolerating p.o. intake, stable vitals, stable and appropriate for discharge home this time   Impression: Viral URI  Disposition:  The patient was discharged home with instructions to take steroid taper as prescribed.  Flonase 2 puffs in each nostril daily to help with congestion.  Over-the-counter medications as needed for symptom control.  Follow-up with PCP if symptoms not improving within the next 5 days. Return precautions given.  Imaging Studies ordered: I ordered imaging studies including chest x-ray I independently visualized the imaging with scope of interpretation limited to determining acute life threatening conditions related to emergency care. Imaging showed no acute abnormalities I agree with the radiologist interpretation             Final Clinical Impression(s) / ED Diagnoses Final diagnoses:  Viral URI    Rx / DC Orders ED Discharge Orders          Ordered    predniSONE (DELTASONE) 10 MG tablet  Q breakfast,   Status:   Discontinued        07/22/23 2235    predniSONE (DELTASONE) 10 MG tablet  Q breakfast        07/22/23 2253              Arabella Merles, PA-C 07/22/23 2320    Arby Barrette, MD 07/27/23 [phone]

## 2023-07-22 NOTE — ED Triage Notes (Signed)
 X Thursday , reports s/o is sick and got her sick, pt c/o runny nose, sore throat and cough

## 2023-07-22 NOTE — ED Notes (Signed)
 Discharge instructions reviewed with patient. Patient questions answered and opportunity for education reviewed. Patient voices understanding of discharge instructions with no further questions. Patient ambulatory with steady gait to lobby.

## 2023-07-24 ENCOUNTER — Telehealth: Payer: Self-pay | Admitting: Internal Medicine

## 2023-07-24 MED ORDER — AZITHROMYCIN 250 MG PO TABS: ORAL_TABLET | ORAL | 0 refills | Status: DC

## 2023-07-24 NOTE — Telephone Encounter (Signed)
 PT presented to the ER this weekend. States she is not feeling any better on the Pred. Wonders if Dr. Jeannie Fend can RX something for her. She states she can not bring up any phlegm. No HFU appts avail at last look so please advise if she needs a FU worked in w/Dr. Jeannie Fend or the soonest NP, please. Her # is [phone]  [facility] [address]

## 2023-07-24 NOTE — Telephone Encounter (Signed)
 Called patient.  Patient states she has dry cough with laryngitis.  Patient is unable to cough any phlegm up and has chest congestion.  Zpak always helps with chest congestion.   Patient states no fever.  Dr. Maple Hudson available now.  Informed Dr. Maple Hudson of telephone contact.  OK to send in Zpak to patients pharmacy.  [facility] [address]  Patient informed.  Rx sent in.

## 2023-07-24 NOTE — Telephone Encounter (Signed)
 Patient is calling back. She would like something called in to [facility] that can go with the prednisone. She would like a zpak if possible. She has phlegm and can't cough it up. She can be reached at [phone]

## 2023-09-15 ENCOUNTER — Encounter: Payer: BC Managed Care – PPO | Admitting: Family Medicine

## 2023-11-28 ENCOUNTER — Ambulatory Visit: Payer: Self-pay

## 2023-11-28 MED ORDER — DOXYCYCLINE HYCLATE 100 MG PO TABS: 100.0000 mg | ORAL_TABLET | Freq: Two times a day (BID) | ORAL | 0 refills | Status: DC

## 2023-11-28 NOTE — Telephone Encounter (Signed)
 coughing up brown stuff think its a sinus infection pt would like antibiotics called in

## 2023-11-28 NOTE — Telephone Encounter (Signed)
 Offer doxycycline  10 mg, # 20, 1 twice daily

## 2023-11-28 NOTE — Telephone Encounter (Signed)
 Spoke with the pt and notified of response from Dr. Neysa  She verbalized understanding  Rx for doxy was sent to preferred pharm  Nothing further needed

## 2023-11-28 NOTE — Telephone Encounter (Signed)
 FYI Only or Action Required?: Action required by provider: requesting Rx.  Patient is followed in Pulmonology for asthma, last seen on 05/11/2023 by Neysa Reggy BIRCH, MD.  Called Nurse Triage reporting Cough.  Symptoms began several days ago.  Interventions attempted: OTC medications: robitussin, mucinex and Increased fluids/rest.  Symptoms are: unchanged.  Triage Disposition: See Physician Within 24 Hours  Patient/caregiver understands and will follow disposition?: No, wishes to speak with PCP    Copied from CRM [id number]. Topic: Clinical - Red Word Triage >> Nov 28, 2023  7:56 AM Benton O wrote: [facility] that prompted transfer to Nurse Triage: severe coughing can barely talk coughing up brown stuff patient sees dr young think its a sinus infection want some antibiotics    ----------------------------------------------------------------------- From previous Reason for Contact - Scheduling: Patient/patient representative is calling to schedule an appointment. Refer to attachments for appointment information. Reason for Disposition  [1] Continuous (nonstop) coughing interferes with work or school AND [2] no improvement using cough treatment per Care Advice  Answer Assessment - Initial Assessment Questions E2C2 Pulmonary Triage - Initial Assessment Questions Chief Complaint (e.g., cough, sob, wheezing, fever, chills, sweat or additional symptoms) *Go to specific symptom protocol after initial questions. Productive brown cough, triager can appreciate severe hoarseness  How long have symptoms been present? Saturday  Have you tested for COVID or Flu? Note: If not, ask patient if a home test can be taken. If so, instruct patient to call back for positive results. No  MEDICINES:   Have you used any OTC meds to help with symptoms? Yes If yes, ask What medications? Robitussin Mucinex - with some relief  Have you used your inhalers/maintenance medication? Yes If yes, What  medications? Albuterol  INH PRN - 2 puffs Triager reviewed/reinforced albuterol  usage and SIG.    If inhaler, ask How many puffs and how often? Note: Review instructions on medication in the chart. See above  OXYGEN: Do you wear supplemental oxygen? No If yes, How many liters are you supposed to use? N/a  Do you monitor your oxygen levels? Yes If yes, What is your reading (oxygen level) today? I didn't even check it today  What is your usual oxygen saturation reading?  (Note: Pulmonary O2 sats should be 90% or greater) High 90s     1. ONSET: When did the cough begin?      See above 2. SEVERITY: How bad is the cough today?      Really bad 3. SPUTUM: Describe the color of your sputum (e.g., none, dry cough; clear, white, yellow, green)     brown 4. HEMOPTYSIS: Are you coughing up any blood? If Yes, ask: How much? (e.g., flecks, streaks, tablespoons, etc.)     denies 5. DIFFICULTY BREATHING: Are you having difficulty breathing? If Yes, ask: How bad is it? (e.g., mild, moderate, severe)      Mild with coughing  6. FEVER: Do you have a fever? If Yes, ask: What is your temperature, how was it measured, and when did it start?     denies 7. CARDIAC HISTORY: Do you have any history of heart disease? (e.g., heart attack, congestive heart failure)      denies 8. LUNG HISTORY: Do you have any history of lung disease?  (e.g., pulmonary embolus, asthma, emphysema)     Asthma 9. PE RISK FACTORS: Do you have a history of blood clots? (or: recent major surgery, recent prolonged travel, bedridden)     denies 10. OTHER SYMPTOMS: Do you have  any other symptoms? (e.g., runny nose, wheezing, chest pain)       Nose is clogged up 11. PREGNANCY: Is there any chance you are pregnant? When was your last menstrual period?       N/a 12. TRAVEL: Have you traveled out of the country in the last month? (e.g., travel history, exposures)       N/a     Triager attempted to schedule acute appt with [facility], no access. Pt requesting abx, steroids and cough syrup. Triager will forward encounter for Dr. Neysa 's office to review and advise. Patient verbalized understanding and is expecting call back from office for next steps. Triager also advised that if pt does not hear back from office, to follow disposition for further evaluation/treatment.  Protocols used: Cough - Acute Productive-A-AH

## 2023-12-13 ENCOUNTER — Encounter: Payer: Self-pay | Admitting: Family Medicine

## 2023-12-13 ENCOUNTER — Ambulatory Visit (INDEPENDENT_AMBULATORY_CARE_PROVIDER_SITE_OTHER): Admitting: Family Medicine

## 2023-12-13 VITALS — BP 118/70 | HR 90 | Temp 97.7°F | Ht 62.0 in | Wt 125.0 lb

## 2023-12-13 DIAGNOSIS — E785 Hyperlipidemia, unspecified: Secondary | ICD-10-CM | POA: Diagnosis not present

## 2023-12-13 DIAGNOSIS — I10 Essential (primary) hypertension: Secondary | ICD-10-CM

## 2023-12-13 DIAGNOSIS — E1165 Type 2 diabetes mellitus with hyperglycemia: Secondary | ICD-10-CM | POA: Insufficient documentation

## 2023-12-13 DIAGNOSIS — K219 Gastro-esophageal reflux disease without esophagitis: Secondary | ICD-10-CM | POA: Diagnosis not present

## 2023-12-13 DIAGNOSIS — J4520 Mild intermittent asthma, uncomplicated: Secondary | ICD-10-CM | POA: Insufficient documentation

## 2023-12-13 DIAGNOSIS — R011 Cardiac murmur, unspecified: Secondary | ICD-10-CM

## 2023-12-13 DIAGNOSIS — Z23 Encounter for immunization: Secondary | ICD-10-CM

## 2023-12-13 LAB — CBC WITH DIFFERENTIAL/PLATELET
Basophils Absolute: 0 K/uL (ref 0.0–0.1)
Basophils Relative: 0.5 % (ref 0.0–3.0)
Eosinophils Absolute: 0.1 K/uL (ref 0.0–0.7)
Eosinophils Relative: 2.4 % (ref 0.0–5.0)
HCT: 34.5 % — ABNORMAL LOW (ref 36.0–46.0)
Hemoglobin: 11.3 g/dL — ABNORMAL LOW (ref 12.0–15.0)
Lymphocytes Relative: 37.2 % (ref 12.0–46.0)
Lymphs Abs: 1.4 K/uL (ref 0.7–4.0)
MCHC: 32.8 g/dL (ref 30.0–36.0)
MCV: 90 fl (ref 78.0–100.0)
Monocytes Absolute: 0.3 K/uL (ref 0.1–1.0)
Monocytes Relative: 8.7 % (ref 3.0–12.0)
Neutro Abs: 2 K/uL (ref 1.4–7.7)
Neutrophils Relative %: 51.2 % (ref 43.0–77.0)
Platelets: 515 K/uL — ABNORMAL HIGH (ref 150.0–400.0)
RBC: 3.84 Mil/uL — ABNORMAL LOW (ref 3.87–5.11)
RDW: 12.7 % (ref 11.5–15.5)
WBC: 3.8 K/uL — ABNORMAL LOW (ref 4.0–10.5)

## 2023-12-13 LAB — COMPREHENSIVE METABOLIC PANEL WITH GFR
ALT: 18 U/L (ref 0–35)
AST: 17 U/L (ref 0–37)
Albumin: 4.3 g/dL (ref 3.5–5.2)
Alkaline Phosphatase: 74 U/L (ref 39–117)
BUN: 19 mg/dL (ref 6–23)
CO2: 30 meq/L (ref 19–32)
Calcium: 9.5 mg/dL (ref 8.4–10.5)
Chloride: 101 meq/L (ref 96–112)
Creatinine, Ser: 0.79 mg/dL (ref 0.40–1.20)
GFR: 82.37 mL/min (ref >60.00)
Glucose, Bld: 200 mg/dL — ABNORMAL HIGH (ref 70–99)
Potassium: 4.4 meq/L (ref 3.5–5.1)
Sodium: 138 meq/L (ref 135–145)
Total Bilirubin: 0.3 mg/dL (ref 0.2–1.2)
Total Protein: 7.2 g/dL (ref 6.0–8.3)

## 2023-12-13 LAB — LIPID PANEL
Cholesterol: 310 mg/dL — ABNORMAL HIGH (ref 0–200)
HDL: 56.3 mg/dL (ref >39.00)
LDL Cholesterol: 187 mg/dL — ABNORMAL HIGH (ref 0–99)
NonHDL: 253.37
Total CHOL/HDL Ratio: 6
Triglycerides: 334 mg/dL — ABNORMAL HIGH (ref 0.0–149.0)
VLDL: 66.8 mg/dL — ABNORMAL HIGH (ref 0.0–40.0)

## 2023-12-13 LAB — HEMOGLOBIN A1C: Hgb A1c MFr Bld: 9.1 % — ABNORMAL HIGH (ref 4.6–6.5)

## 2023-12-13 LAB — TSH: TSH: 1.5 u[IU]/mL (ref 0.35–5.50)

## 2023-12-13 LAB — T4, FREE: Free T4: 1.07 ng/dL (ref 0.60–1.60)

## 2023-12-13 NOTE — Progress Notes (Signed)
 New Patient Office Visit  Subjective    Patient ID: ALJEAN HORIUCHI, female    DOB: [birth year]  Age: 58 y.o. MRN: [id number]  CC:  Chief Complaint  Patient presents with   Establish Care    HPI DEBBERA WOLKEN presents to establish care  Discussed the use of AI scribe software for clinical note transcription with the patient, who gave verbal consent to proceed.  History of Present Illness Nyeisha Goodall Meleane Selinger is a 58 year old female who presents to establish care.  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 - Continues diabetic eye exams every six months  Asthma - Asthma is well-controlled - Uses albuterol  approximately once every three weeks  Exercise and lifestyle - Exercises every morning - Does not smoke or drink alcohol  Occupational stress and adjustment - 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 [employer] for less stress and due to interpersonal issues  Cardiac murmur - Congenital heart murmur     12/13/2023    3:18 PM 07/16/2021    2:40 PM 05/16/2018    3:20 PM 04/23/2018   10:13 AM 02/07/2018    3:38 PM  Depression screen PHQ 2/9  Decreased Interest 0 0 0 0 0  Down, Depressed, Hopeless 0 0 0 0 0  PHQ - 2 Score 0 0 0 0 0  Altered sleeping  1 3 3    Tired, decreased energy  0 0 1   Change in appetite  0 0 3   Feeling bad or failure about yourself   0 0 0   Trouble concentrating  0 0 0    Moving slowly or fidgety/restless  0 0 0   Suicidal thoughts  0 0 0   PHQ-9 Score  1 3 7    Difficult doing work/chores   Not difficult at all Not difficult at all      Outpatient Encounter Medications as of 12/13/2023  Medication Sig   albuterol  (VENTOLIN  HFA) 108 (90 Base) MCG/ACT inhaler Inhale 2 puffs into the lungs every 6 (six) hours as needed for wheezing or shortness of breath.   BIOTIN PO Take by mouth.   Cholecalciferol (VITAMIN D -3 PO) Take by mouth.   hydrochlorothiazide  (HYDRODIURIL ) 12.5 MG tablet Take 12.5 mg by mouth daily.   MAGNESIUM PO Take by mouth.   Multiple Vitamin (MULTIVITAMIN PO) Take by mouth.   [DISCONTINUED] azithromycin  (ZITHROMAX ) 250 MG tablet Two tablets on day one, then one tablet once a day, days 2-5 for infection.   [DISCONTINUED] doxycycline  (VIBRA -TABS) 100 MG tablet Take 1 tablet (100 mg total) by mouth 2 (two) times daily.   [DISCONTINUED] doxycycline  (VIBRA -TABS) 100 MG tablet Take 1 tablet (100 mg total) by mouth 2 (two) times daily.   No facility-administered encounter medications on file as of 12/13/2023.    Past Medical History:  Diagnosis Date   Asthma    GERD (gastroesophageal reflux disease)    Hyperlipidemia    Hypertension    Thrombocytosis 02/08/2017  Type 2 diabetes mellitus (HCC)    insulin  dependent    Past Surgical History:  Procedure Laterality Date   COMBINED HYSTERECTOMY ABDOMINAL W/ A&P REPAIR / OOPHORECTOMY     WRIST ARTHROSCOPY      Family History  Problem Relation Age of Onset   Diabetes Mother    Coronary artery disease Father    Diabetes Father    Breast cancer Neg Hx    Colon cancer Neg Hx    Colon polyps Neg Hx    Esophageal cancer Neg Hx    Rectal cancer Neg Hx    Stomach cancer Neg Hx     Social History   Socioeconomic History   Marital status: Married    Spouse name: Not on file   Number of children: 2   Years of education: 14   Highest education level: Not on file  Occupational History    Occupation: CNA  Tobacco Use   Smoking status: Never   Smokeless tobacco: Never  Vaping Use   Vaping status: Never Used  Substance and Sexual Activity   Alcohol use: No    Alcohol/week: 0.0 standard drinks of alcohol   Drug use: No   Sexual activity: Not Currently    Birth control/protection: Surgical  Other Topics Concern   Not on file  Social History Narrative   Not on file   Social Drivers of Health   Financial Resource Strain: Not on file  Food Insecurity: No Food Insecurity (06/25/2021)   [facility] from [facility]   Hunger Vital Sign    Within the past 12 months, you worried that your food would run out before you got the money to buy more.: Never true    Within the past 12 months, the food you bought just didn't last and you didn't have money to get more.: Never true  Transportation Needs: Not on file  Physical Activity: Not on file  Stress: Not on file  Social Connections: Unknown (09/19/2021)   Received from [facility]   Social Network    Social Network: Not on file  Intimate Partner Violence: Unknown (08/10/2021)   Received from [facility]   HITS    Physically Hurt: Not on file    Insult or Talk Down To: Not on file    Threaten Physical Harm: Not on file    Scream or Curse: Not on file    Review of Systems  Constitutional:  Negative for chills, fever, malaise/fatigue and weight loss.  Eyes:        Left eye blindness -ongoing   Respiratory:  Negative for shortness of breath.   Cardiovascular:  Negative for chest pain, palpitations and leg swelling.  Gastrointestinal:  Negative for abdominal pain, constipation, diarrhea, nausea and vomiting.  Genitourinary:  Negative for dysuria, frequency and urgency.  Neurological:  Negative for dizziness, focal weakness and headaches.  Endo/Heme/Allergies:  Negative for polydipsia.  Psychiatric/Behavioral:  Negative for depression. The patient is not nervous/anxious.         Objective    BP 118/70   Pulse 90    Temp 97.7 F (36.5 C) (Temporal)   Ht 5' 2 (1.575 m)   Wt 125 lb (56.7 kg)   SpO2 98%   BMI 22.86 kg/m   Physical Exam Constitutional:      General: She is not in acute distress.    Appearance: She is not ill-appearing.  HENT:     Mouth/Throat:     Mouth: Mucous membranes are moist.  Pharynx: Oropharynx is clear.  Eyes:     Extraocular Movements: Extraocular movements intact.     Conjunctiva/sclera: Conjunctivae normal.  Cardiovascular:     Rate and Rhythm: Normal rate and regular rhythm.     Heart sounds: Murmur heard.  Pulmonary:     Effort: Pulmonary effort is normal.     Breath sounds: Normal breath sounds.  Musculoskeletal:     Cervical back: Normal range of motion and neck supple.     Right lower leg: No edema.     Left lower leg: No edema.  Skin:    General: Skin is warm and dry.  Neurological:     General: No focal deficit present.     Mental Status: She is alert and oriented to person, place, and time.     Cranial Nerves: No cranial nerve deficit.     Motor: No weakness.     Coordination: Coordination normal.     Gait: Gait normal.  Psychiatric:        Mood and Affect: Mood normal.        Behavior: Behavior normal.        Thought Content: Thought content normal.         Assessment & Plan:   Problem List Items Addressed This Visit     HTN (hypertension) - Primary (Chronic)   Relevant Medications   hydrochlorothiazide  (HYDRODIURIL ) 12.5 MG tablet   Other Relevant Orders   CBC with Differential/Platelet   Comprehensive metabolic panel with GFR   TSH   T4, free   Hyperlipidemia (Chronic)   Relevant Medications   hydrochlorothiazide  (HYDRODIURIL ) 12.5 MG tablet   Other Relevant Orders   Lipid panel   GERD   Heart murmur   Mild intermittent asthma without complication   Uncontrolled diabetes mellitus with hyperglycemia, without long-term current use of insulin  (HCC)   Relevant Orders   CBC with Differential/Platelet   Comprehensive  metabolic panel with GFR   Hemoglobin A1c   TSH   T4, free   Microalbumin / creatinine urine ratio   Other Visit Diagnoses       Immunization due           Assessment and Plan Assessment & Plan Type 2 diabetes mellitus Type 2 diabetes mellitus with a previously recorded hemoglobin A1c of 11.3, indicating poor glycemic control. No recent A1c test since last year. No current medication for diabetes and she does not want to take metformin . No symptoms of hyperglycemia such as increased thirst or urinary frequency reported. - Order blood work to assess current hemoglobin A1c and kidney function - Order urine protein test - Discuss potential need for diabetes medication based on lab results  Hypertension Hypertension currently managed with hydrochlorothiazide  12.5 mg, which she has been taking from her husband's supply due to lack of prescription. No reported symptoms of uncontrolled hypertension such as headaches or vision changes. - Prescribe hydrochlorothiazide  12.5 mg  Hyperlipidemia Hyperlipidemia with no current medication or treatment plan discussed.  Asthma Asthma is well-controlled with infrequent use of albuterol , approximately once every three weeks. No recent exacerbations or hospitalizations reported.  Gastroesophageal reflux disease Gastroesophageal reflux disease managed with dietary modifications. No current medication use for GERD. No recent symptoms reported.  Heart murmur Congenital heart murmur noted on examination. No symptoms of heart failure or other cardiac issues reported.    Return for pending labs.   Boby Mackintosh, NP-C

## 2023-12-13 NOTE — Patient Instructions (Signed)
 Please go downstairs for labs and a urine test before you leave.   I will be in touch with your results.

## 2023-12-14 ENCOUNTER — Ambulatory Visit: Payer: Self-pay | Admitting: Family Medicine

## 2023-12-14 NOTE — Progress Notes (Signed)
 Please reach out to her.  Schedule her to come back in to see me to discuss diabetes and cholesterol unless she would like for me to go ahead and send medications to her pharmacy.  I would like to see her back in 2 to 4 weeks either way.  Please look at my note to her before calling.  Thanks

## 2023-12-15 LAB — MICROALBUMIN / CREATININE URINE RATIO
Creatinine,U: 206.1 mg/dL
Microalb Creat Ratio: 323.1 mg/g — ABNORMAL HIGH (ref 0.0–30.0)
Microalb, Ur: 66.6 mg/dL — ABNORMAL HIGH (ref 0.0–1.9)

## 2023-12-29 ENCOUNTER — Ambulatory Visit: Admitting: Family Medicine

## 2024-03-11 ENCOUNTER — Encounter: Payer: Self-pay | Admitting: Radiology

## 2024-05-21 ENCOUNTER — Ambulatory Visit
Admission: EM | Admit: 2024-05-21 | Discharge: 2024-05-21 | Disposition: A | Discharge status: Home / Self Care | Payer: Self-pay | Attending: Family Medicine | Admitting: Family Medicine

## 2024-05-21 ENCOUNTER — Emergency Department (HOSPITAL_COMMUNITY)

## 2024-05-21 ENCOUNTER — Emergency Department (HOSPITAL_COMMUNITY)
Admission: EM | Admit: 2024-05-21 | Discharge: 2024-05-22 | Discharge status: Against Medical Advice | Source: Ambulatory Visit | Attending: Emergency Medicine | Admitting: Emergency Medicine

## 2024-05-21 ENCOUNTER — Encounter (HOSPITAL_COMMUNITY): Payer: Self-pay

## 2024-05-21 ENCOUNTER — Other Ambulatory Visit: Payer: Self-pay

## 2024-05-21 DIAGNOSIS — Y92410 Unspecified street and highway as the place of occurrence of the external cause: Secondary | ICD-10-CM | POA: Insufficient documentation

## 2024-05-21 DIAGNOSIS — M542 Cervicalgia: Secondary | ICD-10-CM | POA: Diagnosis not present

## 2024-05-21 DIAGNOSIS — R519 Headache, unspecified: Secondary | ICD-10-CM | POA: Diagnosis present

## 2024-05-21 DIAGNOSIS — R52 Pain, unspecified: Secondary | ICD-10-CM

## 2024-05-21 MED ORDER — ACETAMINOPHEN 325 MG PO TABS
975.0000 mg | ORAL_TABLET | Freq: Once | ORAL | Status: AC
  Administered 2024-05-21: 975 mg via ORAL
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
## 2024-05-21 NOTE — ED Notes (Signed)
 Patient is being discharged from the Urgent Care and sent to the Emergency Department via POV . Per Christopher, PA-C , patient is in need of higher level of care due to c/o HA. Patient is aware and verbalizes understanding of plan of care.  Vitals:   05/21/24 1537  BP: (!) 168/86  Pulse: 75  Resp: 16  Temp: 98.6 F (37 C)  SpO2: 95%
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
## 2024-05-21 NOTE — Discharge Instructions (Signed)
 You are in need of a higher level of care than we can provide in the urgent care setting.  This includes consideration for head CT scan given your severe headache, severe pain with eye movements.  We have given you Tylenol  to help with your pain in the meantime.  Please go straight to the emergency room.
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
## 2024-05-22 NOTE — ED Notes (Signed)
 Called for updated vitals no response
